# Patient Record
Sex: Female | Born: 1970
Health system: Southern US, Community
[De-identification: ages and names within clinical notes are randomized; demographics above are authoritative.]

## PROBLEM LIST (undated history)

## (undated) DIAGNOSIS — I10 Essential (primary) hypertension: Secondary | ICD-10-CM

## (undated) DIAGNOSIS — Z9889 Other specified postprocedural states: Secondary | ICD-10-CM

## (undated) DIAGNOSIS — R112 Nausea with vomiting, unspecified: Secondary | ICD-10-CM

## (undated) DIAGNOSIS — M1990 Unspecified osteoarthritis, unspecified site: Secondary | ICD-10-CM

## (undated) DIAGNOSIS — Z8249 Family history of ischemic heart disease and other diseases of the circulatory system: Secondary | ICD-10-CM

## (undated) HISTORY — PX: TUBAL LIGATION: SHX77

## (undated) HISTORY — PX: CHOLECYSTECTOMY: SHX55

## (undated) HISTORY — PX: INTRAUTERINE DEVICE INSERTION: SHX323

---

## 2000-10-17 ENCOUNTER — Emergency Department (HOSPITAL_COMMUNITY): Admission: EM | Admit: 2000-10-17 | Discharge: 2000-10-17 | Payer: Self-pay | Admitting: *Deleted

## 2000-11-21 ENCOUNTER — Encounter: Payer: Self-pay | Admitting: Obstetrics and Gynecology

## 2000-11-21 ENCOUNTER — Ambulatory Visit (HOSPITAL_COMMUNITY): Admission: RE | Admit: 2000-11-21 | Discharge: 2000-11-21 | Payer: Self-pay | Admitting: Obstetrics and Gynecology

## 2001-06-20 ENCOUNTER — Ambulatory Visit (HOSPITAL_COMMUNITY): Admission: RE | Admit: 2001-06-20 | Discharge: 2001-06-20 | Payer: Self-pay | Admitting: Obstetrics and Gynecology

## 2001-06-20 ENCOUNTER — Encounter: Payer: Self-pay | Admitting: Obstetrics and Gynecology

## 2001-07-02 ENCOUNTER — Other Ambulatory Visit: Admission: RE | Admit: 2001-07-02 | Discharge: 2001-07-02 | Payer: Self-pay | Admitting: Obstetrics & Gynecology

## 2002-05-01 ENCOUNTER — Other Ambulatory Visit: Admission: RE | Admit: 2002-05-01 | Discharge: 2002-05-01 | Payer: Self-pay | Admitting: Family Medicine

## 2005-06-01 ENCOUNTER — Other Ambulatory Visit: Admission: RE | Admit: 2005-06-01 | Discharge: 2005-06-01 | Payer: Self-pay | Admitting: Gynecology

## 2007-05-19 ENCOUNTER — Emergency Department (HOSPITAL_COMMUNITY): Admission: EM | Admit: 2007-05-19 | Discharge: 2007-05-19 | Payer: Self-pay | Admitting: Emergency Medicine

## 2007-05-20 ENCOUNTER — Ambulatory Visit (HOSPITAL_COMMUNITY): Admission: RE | Admit: 2007-05-20 | Discharge: 2007-05-20 | Payer: Self-pay | Admitting: Cardiovascular Disease

## 2007-05-21 ENCOUNTER — Ambulatory Visit (HOSPITAL_COMMUNITY): Admission: RE | Admit: 2007-05-21 | Discharge: 2007-05-21 | Payer: Self-pay | Admitting: Cardiovascular Disease

## 2008-01-21 ENCOUNTER — Other Ambulatory Visit: Admission: RE | Admit: 2008-01-21 | Discharge: 2008-01-21 | Payer: Self-pay | Admitting: Obstetrics and Gynecology

## 2008-01-21 ENCOUNTER — Ambulatory Visit: Payer: Self-pay | Admitting: Women's Health

## 2008-01-21 ENCOUNTER — Encounter: Payer: Self-pay | Admitting: Women's Health

## 2008-05-17 ENCOUNTER — Ambulatory Visit: Payer: Self-pay | Admitting: Obstetrics and Gynecology

## 2008-05-31 ENCOUNTER — Ambulatory Visit: Payer: Self-pay | Admitting: Obstetrics and Gynecology

## 2008-06-01 ENCOUNTER — Ambulatory Visit: Payer: Self-pay | Admitting: Obstetrics and Gynecology

## 2010-12-29 LAB — DIFFERENTIAL
Basophils Absolute: 0
Basophils Relative: 0
Eosinophils Absolute: 0.1
Eosinophils Relative: 1
Lymphocytes Relative: 26
Lymphs Abs: 2.1
Monocytes Absolute: 0.2
Monocytes Relative: 2 — ABNORMAL LOW
Neutro Abs: 5.7
Neutrophils Relative %: 70

## 2010-12-29 LAB — BASIC METABOLIC PANEL WITH GFR
CO2: 25
Chloride: 107
Creatinine, Ser: 0.64
GFR calc Af Amer: >60
Sodium: 139

## 2010-12-29 LAB — POCT CARDIAC MARKERS
CKMB, poc: <1 — ABNORMAL LOW
CKMB, poc: <1 — ABNORMAL LOW
Myoglobin, poc: 36.4
Myoglobin, poc: 37.5
Operator id: 4708
Operator id: 5362
Troponin i, poc: <0.05
Troponin i, poc: <0.05

## 2010-12-29 LAB — BASIC METABOLIC PANEL
BUN: 8
Calcium: 9.2
GFR calc non Af Amer: >60
Glucose, Bld: 103 — ABNORMAL HIGH
Potassium: 4.2

## 2010-12-29 LAB — CBC
HCT: 30 — ABNORMAL LOW
Hemoglobin: 9.7 — ABNORMAL LOW
MCHC: 32.4
MCV: 73.4 — ABNORMAL LOW
Platelets: 370
RBC: 4.08
RDW: 17.2 — ABNORMAL HIGH
WBC: 8.1

## 2010-12-29 LAB — D-DIMER, QUANTITATIVE: D-Dimer, Quant: <0.22

## 2012-11-07 ENCOUNTER — Ambulatory Visit: Payer: Self-pay | Admitting: Women's Health

## 2012-11-10 ENCOUNTER — Encounter: Payer: Self-pay | Admitting: Women's Health

## 2012-11-10 ENCOUNTER — Other Ambulatory Visit (HOSPITAL_COMMUNITY)
Admission: RE | Admit: 2012-11-10 | Discharge: 2012-11-10 | Disposition: A | Discharge status: Home / Self Care | Payer: BC Managed Care – PPO | Source: Ambulatory Visit | Attending: Gynecology | Admitting: Gynecology

## 2012-11-10 ENCOUNTER — Ambulatory Visit (INDEPENDENT_AMBULATORY_CARE_PROVIDER_SITE_OTHER): Payer: BC Managed Care – PPO | Admitting: Women's Health

## 2012-11-10 VITALS — BP 135/78 | Ht 63.0 in | Wt 350.0 lb

## 2012-11-10 DIAGNOSIS — Z13220 Encounter for screening for lipoid disorders: Secondary | ICD-10-CM

## 2012-11-10 DIAGNOSIS — Z01419 Encounter for gynecological examination (general) (routine) without abnormal findings: Secondary | ICD-10-CM

## 2012-11-10 DIAGNOSIS — Z833 Family history of diabetes mellitus: Secondary | ICD-10-CM

## 2012-11-10 DIAGNOSIS — E079 Disorder of thyroid, unspecified: Secondary | ICD-10-CM

## 2012-11-10 DIAGNOSIS — Z975 Presence of (intrauterine) contraceptive device: Secondary | ICD-10-CM

## 2012-11-10 HISTORY — DX: Morbid (severe) obesity due to excess calories: E66.01

## 2012-11-10 NOTE — Patient Instructions (Addendum)

## 2012-11-10 NOTE — Progress Notes (Signed)
Denae MAYDA SHIPPEE 28-Jun-1970 [id number]    History:    The patient presents for annual exam.  Mirena IUD placed 05/2008 for menorrhagia. Had some problems with menorrhagia after IUD, used Megace for several doses, now minimal bleeding. BTL. Normal Pap history. Has not had a mammogram. Morbid obesity.   Past medical history, past surgical history, family history and social history were all reviewed and documented in the EPIC chart. Parents hypertension, father heart disease, maternal grandmother breast cancer. 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 Finishing  MBA. Desk job.   ROS:  A  ROS was performed and pertinent positives and negatives are included in the history.  Exam:  Filed Vitals:   11/10/12 0920  BP: 135/78    General appearance:  Morbid obesity Head/Neck:  Normal, without cervical or supraclavicular adenopathy. Thyroid:  Symmetrical, normal in size, without palpable masses or nodularity. Respiratory  Effort:  Normal  Auscultation:  Clear without wheezing or rhonchi Cardiovascular  Auscultation:  Regular rate, without rubs, murmurs or gallops  Edema/varicosities:  Not grossly evident Abdominal  Soft,nontender, without masses, guarding or rebound.  Liver/spleen:  No organomegaly noted  Hernia:  None appreciated  Skin  Inspection:  Numerous skin tags  Palpation:  Grossly normal Neurologic/psychiatric  Orientation:  Normal with appropriate conversation.  Mood/affect:  Normal  Genitourinary    Breasts: Examined lying and sitting.     Right: Without masses, retractions, discharge or axillary adenopathy.     Left: Without masses, retractions, discharge or axillary adenopathy.   Inguinal/mons:  Normal without inguinal adenopathy  External genitalia:  Normal  BUS/Urethra/Skene's glands:  Normal  Bladder:  Normal  Vagina:  Normal  Cervix:  Normal IUD strings visible  Uterus:   normal in size, shape and  contour.  Midline and mobile  Adnexa/parametria:     Rt: Without masses or tenderness.   Lt: Without masses or tenderness.  Anus and perineum: Normal  Digital rectal exam: Normal sphincter tone without palpated masses or tenderness  Assessment/Plan:  42 y.o. MBF G2P2 for annual exam.     Mirena IUD placed 05/2008 Morbid obesity  Plan: Information concerning weight loss surgery given, states would like to pursue. SBE's, reviewed importance of annual screening mammogram, breast center number given instructed to schedule. Reviewed importance of increasing regular activity and decreasing calories for weight loss. Schedule ultrasound to 2 limited exam. CBC, glucose, lipid panel, UA, Pap. Last  Pap 2009, new screening guidelines reviewed. Reviewed importance of an annual checkup.    Harrington Challenger [facility], 12:10 PM 11/10/2012

## 2012-11-11 LAB — URINALYSIS W MICROSCOPIC + REFLEX CULTURE
Bilirubin Urine: NEGATIVE
Glucose, UA: NEGATIVE mg/dL
Protein, ur: NEGATIVE mg/dL
Urobilinogen, UA: 0.2 mg/dL (ref 0.0–1.0)

## 2012-11-11 LAB — CBC WITH DIFFERENTIAL/PLATELET
Basophils Relative: 0 % (ref 0–1)
Eosinophils Absolute: 0.2 10*3/uL (ref 0.0–0.7)
HCT: 35.8 % — ABNORMAL LOW (ref 36.0–46.0)
Hemoglobin: 11.3 g/dL — ABNORMAL LOW (ref 12.0–15.0)
Lymphs Abs: 1.6 10*3/uL (ref 0.7–4.0)
MCH: 24.2 pg — ABNORMAL LOW (ref 26.0–34.0)
MCHC: 31.6 g/dL (ref 30.0–36.0)
MCV: 76.8 fL — ABNORMAL LOW (ref 78.0–100.0)
Monocytes Absolute: 0.6 10*3/uL (ref 0.1–1.0)
Monocytes Relative: 8 % (ref 3–12)
Neutrophils Relative %: 66 % (ref 43–77)
RBC: 4.66 MIL/uL (ref 3.87–5.11)

## 2012-11-11 LAB — LIPID PANEL
HDL: 32 mg/dL — ABNORMAL LOW (ref >39)
Total CHOL/HDL Ratio: 4.7 Ratio
VLDL: 14 mg/dL (ref 0–40)

## 2012-11-11 LAB — GLUCOSE, RANDOM: Glucose, Bld: 125 mg/dL — ABNORMAL HIGH (ref 70–99)

## 2012-11-11 LAB — TSH: TSH: 2.479 u[IU]/mL (ref 0.350–4.500)

## 2012-11-12 LAB — URINE CULTURE: Colony Count: >=100000

## 2012-11-13 ENCOUNTER — Other Ambulatory Visit: Payer: Self-pay | Admitting: Gynecology

## 2012-11-13 DIAGNOSIS — R7309 Other abnormal glucose: Secondary | ICD-10-CM

## 2012-11-24 ENCOUNTER — Encounter: Payer: Self-pay | Admitting: Women's Health

## 2012-11-24 ENCOUNTER — Other Ambulatory Visit: Payer: BC Managed Care – PPO

## 2012-11-24 DIAGNOSIS — R7309 Other abnormal glucose: Secondary | ICD-10-CM

## 2012-11-27 ENCOUNTER — Telehealth: Payer: Self-pay | Admitting: *Deleted

## 2012-11-27 NOTE — Telephone Encounter (Signed)
Notes faxed to Dr.Balan office,they will contact patient to schedule appointment.

## 2012-11-28 NOTE — Telephone Encounter (Signed)
Appt. On 01/09/13 @ 10:30 am with the Dr.Balan, left on voicemail with time and date.

## 2013-02-12 ENCOUNTER — Other Ambulatory Visit: Payer: Self-pay

## 2013-04-09 HISTORY — PX: OTHER SURGICAL HISTORY: SHX169

## 2013-04-10 ENCOUNTER — Ambulatory Visit: Payer: Self-pay | Admitting: Specialist

## 2013-04-13 ENCOUNTER — Ambulatory Visit: Payer: Self-pay | Admitting: Specialist

## 2013-04-13 DIAGNOSIS — Z01810 Encounter for preprocedural cardiovascular examination: Secondary | ICD-10-CM

## 2013-04-13 LAB — CBC WITH DIFFERENTIAL/PLATELET
BASOS PCT: 0.8 %
Basophil #: 0.1 10*3/uL (ref 0.0–0.1)
EOS PCT: 1.4 %
Eosinophil #: 0.1 10*3/uL (ref 0.0–0.7)
HCT: 36.1 % (ref 35.0–47.0)
HGB: 11.5 g/dL — AB (ref 12.0–16.0)
LYMPHS ABS: 1.7 10*3/uL (ref 1.0–3.6)
Lymphocyte %: 21.6 %
MCH: 24.8 pg — AB (ref 26.0–34.0)
MCHC: 31.8 g/dL — AB (ref 32.0–36.0)
MCV: 78 fL — ABNORMAL LOW (ref 80–100)
Monocyte #: 0.7 x10 3/mm (ref 0.2–0.9)
Monocyte %: 8.6 %
NEUTROS ABS: 5.3 10*3/uL (ref 1.4–6.5)
NEUTROS PCT: 67.6 %
Platelet: 244 10*3/uL (ref 150–440)
RBC: 4.63 10*6/uL (ref 3.80–5.20)
RDW: 16.7 % — AB (ref 11.5–14.5)
WBC: 7.8 10*3/uL (ref 3.6–11.0)

## 2013-04-13 LAB — COMPREHENSIVE METABOLIC PANEL
ANION GAP: 6 — AB (ref 7–16)
Albumin: 3.3 g/dL — ABNORMAL LOW (ref 3.4–5.0)
Alkaline Phosphatase: 100 U/L
BUN: 7 mg/dL (ref 7–18)
Bilirubin,Total: 0.4 mg/dL (ref 0.2–1.0)
CALCIUM: 9 mg/dL (ref 8.5–10.1)
CHLORIDE: 104 mmol/L (ref 98–107)
CO2: 27 mmol/L (ref 21–32)
CREATININE: 0.69 mg/dL (ref 0.60–1.30)
EGFR (African American): >60
EGFR (Non-African Amer.): >60
GLUCOSE: 144 mg/dL — AB (ref 65–99)
Osmolality: 274 (ref 275–301)
POTASSIUM: 3.9 mmol/L (ref 3.5–5.1)
SGOT(AST): 47 U/L — ABNORMAL HIGH (ref 15–37)
SGPT (ALT): 54 U/L (ref 12–78)
Sodium: 137 mmol/L (ref 136–145)
Total Protein: 7.9 g/dL (ref 6.4–8.2)

## 2013-04-13 LAB — AMYLASE: AMYLASE: 33 U/L (ref 25–115)

## 2013-04-13 LAB — BILIRUBIN, DIRECT

## 2013-04-13 LAB — IRON AND TIBC
IRON SATURATION: 12 %
Iron Bind.Cap.(Total): 367 ug/dL (ref 250–450)
Iron: 44 ug/dL — ABNORMAL LOW (ref 50–170)
UNBOUND IRON-BIND. CAP.: 323 ug/dL

## 2013-04-13 LAB — TSH: Thyroid Stimulating Horm: 2.39 u[IU]/mL

## 2013-04-13 LAB — PROTIME-INR
INR: 1.1
PROTHROMBIN TIME: 13.7 s (ref 11.5–14.7)

## 2013-04-13 LAB — HEMOGLOBIN A1C: HEMOGLOBIN A1C: 7.6 % — AB (ref 4.2–6.3)

## 2013-04-13 LAB — PHOSPHORUS: Phosphorus: 3.2 mg/dL (ref 2.5–4.9)

## 2013-04-13 LAB — MAGNESIUM: MAGNESIUM: 1.8 mg/dL

## 2013-04-13 LAB — LIPASE, BLOOD: Lipase: 108 U/L (ref 73–393)

## 2013-04-13 LAB — APTT: ACTIVATED PTT: 26.5 s (ref 23.6–35.9)

## 2013-04-13 LAB — FERRITIN: Ferritin (ARMC): 29 ng/mL (ref 8–388)

## 2013-04-13 LAB — FOLATE: FOLIC ACID: 7.1 ng/mL (ref 3.1–100.0)

## 2013-05-08 ENCOUNTER — Ambulatory Visit (INDEPENDENT_AMBULATORY_CARE_PROVIDER_SITE_OTHER): Payer: BC Managed Care – PPO | Admitting: Women's Health

## 2013-05-08 ENCOUNTER — Encounter: Payer: Self-pay | Admitting: Women's Health

## 2013-05-08 DIAGNOSIS — N949 Unspecified condition associated with female genital organs and menstrual cycle: Secondary | ICD-10-CM

## 2013-05-08 DIAGNOSIS — N938 Other specified abnormal uterine and vaginal bleeding: Secondary | ICD-10-CM

## 2013-05-08 DIAGNOSIS — N898 Other specified noninflammatory disorders of vagina: Secondary | ICD-10-CM

## 2013-05-08 LAB — WET PREP FOR TRICH, YEAST, CLUE
Clue Cells Wet Prep HPF POC: NONE SEEN
Trich, Wet Prep: NONE SEEN
WBC, Wet Prep HPF POC: NONE SEEN
YEAST WET PREP: NONE SEEN

## 2013-05-08 MED ORDER — MEGESTROL ACETATE 40 MG PO TABS: 40.0000 mg | ORAL_TABLET | Freq: Two times a day (BID) | ORAL | Status: DC

## 2013-05-08 NOTE — Progress Notes (Signed)
Patient ID: Karen [name], female   DOB: [birth year], 43 y.o.   MRN: [id number] Presents with complaint of  long period. BTL with Mirena IUD placed 05/2008 for menorrhagia and DUB. Consistent monthly cycles lasting 3-5 days since IUD placed. 03/29/2013 1st day of last cycle and has bled since then with many days heavy with clots/cramps. Has change pad today twice. Denies urinary symptoms, discharge, abdominal pain or fever. Is scheduled for gastric sleeve  05/26/13. Numerous normal labs with surgeon.  Exam: Morbid obesity, appears well, external genitalia within normal limits, speculum exam scant menses type blood noted cervix without polyp or visible lesion, IUD string not visible. Bimanual no CMT. Limited exam/obesity. Wet prep negative.  Dysfunctional uterine bleeding/ Mirena IUD 05/2008/BTL Morbid obesity  Plan: Megace 40 twice daily for 10 days. Reviewed may not need to take, appears bleeding has slowed. Schedule ultrasound, assess ovaries and uterus and for IUD removal and placement with Dr. Phineas Real.

## 2013-05-08 NOTE — Addendum Note (Signed)
Addended by: Alen Blew on: 05/08/2013 04:54 PM   Modules accepted: Orders

## 2013-05-08 NOTE — Patient Instructions (Signed)
Dysfunctional Uterine Bleeding Normally, menstrual periods begin between ages 11 to 17 in [place] women. A normal menstrual cycle/period may begin every 23 days up to 35 days and lasts from 1 to 7 days. Around 12 to 14 days before your menstrual period starts, ovulation (ovary produces an egg) occurs. When counting the time between menstrual periods, count from the first day of bleeding of the previous period to the first day of bleeding of the next period. Dysfunctional (abnormal) uterine bleeding is bleeding that is different from a normal menstrual period. Your periods may come earlier or later than usual. They may be lighter, have blood clots or be heavier. You may have bleeding between periods, or you may skip one period or more. You may have bleeding after sexual intercourse, bleeding after menopause, or no menstrual period. CAUSES   Pregnancy (normal, miscarriage, tubal).  IUDs (intrauterine device, birth control).  Birth control pills.  Hormone treatment.  Menopause.  Infection of the cervix.  Blood clotting problems.  Infection of the inside lining of the uterus.  Endometriosis, inside lining of the uterus growing in the pelvis and other female organs.  Adhesions (scar tissue) inside the uterus.  Obesity or severe weight loss.  Uterine polyps inside the uterus.  Cancer of the vagina, cervix, or uterus.  Ovarian cysts or polycystic ovary syndrome.  Medical problems (diabetes, thyroid disease).  Uterine fibroids (noncancerous tumor).  Problems with your female hormones.  Endometrial hyperplasia, very thick lining and enlarged cells inside of the uterus.  Medicines that interfere with ovulation.  Radiation to the pelvis or abdomen.  Chemotherapy. DIAGNOSIS   Your doctor will discuss the history of your menstrual periods, medicines you are taking, changes in your weight, stress in your life, and any medical problems you may have.  Your doctor will do a physical  and pelvic examination.  Your doctor may want to perform certain tests to make a diagnosis, such as:  Pap test.  Blood tests.  Cultures for infection.  CT scan.  Ultrasound.  Hysteroscopy.  Laparoscopy.  MRI.  Hysterosalpingography.  D and C.  Endometrial biopsy. TREATMENT  Treatment will depend on the cause of the dysfunctional uterine bleeding (DUB). Treatment may include:  Observing your menstrual periods for a couple of months.  Prescribing medicines for medical problems, including:  Antibiotics.  Hormones.  Birth control pills.  Removing an IUD (intrauterine device, birth control).  Surgery:  D and C (scrape and remove tissue from inside the uterus).  Laparoscopy (examine inside the abdomen with a lighted tube).  Uterine ablation (destroy lining of the uterus with electrical current, laser, heat, or freezing).  Hysteroscopy (examine cervix and uterus with a lighted tube).  Hysterectomy (remove the uterus). HOME CARE INSTRUCTIONS   If medicines were prescribed, take exactly as directed. Do not change or switch medicines without consulting your caregiver.  Long term heavy bleeding may result in iron deficiency. Your caregiver may have prescribed iron pills. They help replace the iron that your body lost from heavy bleeding. Take exactly as directed.  Do not take aspirin or medicines that contain aspirin one week before or during your menstrual period. Aspirin may make the bleeding worse.  If you need to change your sanitary pad or tampon more than once every 2 hours, stay in bed with your feet elevated and a cold pack on your lower abdomen. Rest as much as possible, until the bleeding stops or slows down.  Eat well-balanced meals. Eat foods high in iron. Examples   are:  Leafy green vegetables.  Whole-grain breads and cereals.  Eggs.  Meat.  Liver.  Do not try to lose weight until the abnormal bleeding has stopped and your blood iron level is  back to normal. Do not lift more than ten pounds or do strenuous activities when you are bleeding.  For a couple of months, make note on your calendar, marking the start and ending of your period, and the type of bleeding (light, medium, heavy, spotting, clots or missed periods). This is for your caregiver to better evaluate your problem. SEEK MEDICAL CARE IF:   You develop nausea (feeling sick to your stomach) and vomiting, dizziness, or diarrhea while you are taking your medicine.  You are getting lightheaded or weak.  You have any problems that may be related to the medicine you are taking.  You develop pain with your DUB.  You want to remove your IUD.  You want to stop or change your birth control pills or hormones.  You have any type of abnormal bleeding mentioned above.  You are over 16 years old and have not had a menstrual period yet.  You are 43 years old and you are still having menstrual periods.  You have any of the symptoms mentioned above.  You develop a rash. SEEK IMMEDIATE MEDICAL CARE IF:   An oral temperature above 102 F (38.9 C) develops.  You develop chills.  You are changing your sanitary pad or tampon more than once an hour.  You develop abdominal pain.  You pass out or faint. Document Released: 03/23/2000 Document Revised: 06/18/2011 Document Reviewed: 02/22/2009 [employer] Patient Information 2014 [employer].  

## 2013-05-10 ENCOUNTER — Ambulatory Visit: Payer: Self-pay | Admitting: Specialist

## 2013-05-12 ENCOUNTER — Ambulatory Visit: Payer: BC Managed Care – PPO | Admitting: Women's Health

## 2013-05-28 ENCOUNTER — Inpatient Hospital Stay: Payer: Self-pay | Admitting: Specialist

## 2013-05-29 ENCOUNTER — Telehealth: Payer: Self-pay | Admitting: *Deleted

## 2013-05-29 ENCOUNTER — Other Ambulatory Visit: Payer: Self-pay | Admitting: *Deleted

## 2013-05-29 LAB — CBC WITH DIFFERENTIAL/PLATELET
BASOS ABS: 0 10*3/uL (ref 0.0–0.1)
Basophil %: 0.3 %
EOS ABS: 0 10*3/uL (ref 0.0–0.7)
Eosinophil %: 0 %
HCT: 32.6 % — ABNORMAL LOW (ref 35.0–47.0)
HGB: 10.7 g/dL — ABNORMAL LOW (ref 12.0–16.0)
LYMPHS ABS: 1.2 10*3/uL (ref 1.0–3.6)
Lymphocyte %: 11.3 %
MCH: 26.4 pg (ref 26.0–34.0)
MCHC: 32.7 g/dL (ref 32.0–36.0)
MCV: 81 fL (ref 80–100)
Monocyte #: 0.9 x10 3/mm (ref 0.2–0.9)
Monocyte %: 8.1 %
NEUTROS PCT: 80.3 %
Neutrophil #: 8.6 10*3/uL — ABNORMAL HIGH (ref 1.4–6.5)
Platelet: 262 10*3/uL (ref 150–440)
RBC: 4.03 10*6/uL (ref 3.80–5.20)
RDW: 16.6 % — AB (ref 11.5–14.5)
WBC: 10.7 10*3/uL (ref 3.6–11.0)

## 2013-05-29 LAB — BASIC METABOLIC PANEL
Anion Gap: 6 — ABNORMAL LOW (ref 7–16)
BUN: 5 mg/dL — AB (ref 7–18)
CHLORIDE: 106 mmol/L (ref 98–107)
CREATININE: 0.81 mg/dL (ref 0.60–1.30)
Calcium, Total: 8.5 mg/dL (ref 8.5–10.1)
Co2: 25 mmol/L (ref 21–32)
EGFR (Non-African Amer.): >60
Glucose: 95 mg/dL (ref 65–99)
OSMOLALITY: 271 (ref 275–301)
POTASSIUM: 3.8 mmol/L (ref 3.5–5.1)
Sodium: 137 mmol/L (ref 136–145)

## 2013-05-29 LAB — PHOSPHORUS: Phosphorus: 3.8 mg/dL (ref 2.5–4.9)

## 2013-05-29 LAB — MAGNESIUM: Magnesium: 1.7 mg/dL — ABNORMAL LOW

## 2013-05-29 LAB — ALBUMIN: Albumin: 3.3 g/dL — ABNORMAL LOW (ref 3.4–5.0)

## 2013-05-29 MED ORDER — MEGESTROL ACETATE 40 MG/ML PO SUSP: 40.0000 mg | Freq: Every day | ORAL | Status: DC

## 2013-05-29 NOTE — Telephone Encounter (Signed)
(  You are back up MD) pt called requesting refill on megace, nancy prescribed this on OV 05/08/13 due to DUB. Pt said she would like liquid megace if possible, pt had gastric sleeve 05/26/13 and unable to take pills. Please advise

## 2013-05-29 NOTE — Telephone Encounter (Signed)
Patient is supposed to have an ultrasound appointment and replacement of her Mirena IUD. Make sure that she is on track for this. Recommend Megace 20 mg twice a day #10

## 2013-05-29 NOTE — Telephone Encounter (Signed)
Pt informed rx has been sent. And will call back to have the ultrasound scheduled and IUD removal.

## 2013-05-29 NOTE — Telephone Encounter (Signed)
I will speak with patient about ultrasound and replacement of Mirena IUD. But she will need liquid megace, the above is what I found in epic for liquid megace. Im not sure which one to prescribe. Please advise

## 2013-05-29 NOTE — Telephone Encounter (Signed)
(  You are back up MD) pt called requesting refill on megace, nancy prescribed this on OV 05/08/13

## 2013-06-01 LAB — PATHOLOGY REPORT

## 2013-06-16 ENCOUNTER — Ambulatory Visit: Payer: Self-pay | Admitting: Specialist

## 2013-07-05 ENCOUNTER — Emergency Department (HOSPITAL_COMMUNITY)
Admission: EM | Admit: 2013-07-05 | Discharge: 2013-07-05 | Disposition: A | Discharge status: Home / Self Care | Payer: BC Managed Care – PPO | Attending: Emergency Medicine | Admitting: Emergency Medicine

## 2013-07-05 ENCOUNTER — Emergency Department (HOSPITAL_COMMUNITY): Payer: BC Managed Care – PPO

## 2013-07-05 ENCOUNTER — Encounter (HOSPITAL_COMMUNITY): Payer: Self-pay | Admitting: Emergency Medicine

## 2013-07-05 DIAGNOSIS — Z9889 Other specified postprocedural states: Secondary | ICD-10-CM | POA: Insufficient documentation

## 2013-07-05 DIAGNOSIS — Z3202 Encounter for pregnancy test, result negative: Secondary | ICD-10-CM | POA: Insufficient documentation

## 2013-07-05 DIAGNOSIS — Z87891 Personal history of nicotine dependence: Secondary | ICD-10-CM | POA: Insufficient documentation

## 2013-07-05 DIAGNOSIS — Z79899 Other long term (current) drug therapy: Secondary | ICD-10-CM | POA: Insufficient documentation

## 2013-07-05 DIAGNOSIS — E669 Obesity, unspecified: Secondary | ICD-10-CM | POA: Insufficient documentation

## 2013-07-05 DIAGNOSIS — R1012 Left upper quadrant pain: Secondary | ICD-10-CM | POA: Insufficient documentation

## 2013-07-05 DIAGNOSIS — R109 Unspecified abdominal pain: Secondary | ICD-10-CM

## 2013-07-05 DIAGNOSIS — R1032 Left lower quadrant pain: Secondary | ICD-10-CM | POA: Insufficient documentation

## 2013-07-05 DIAGNOSIS — R110 Nausea: Secondary | ICD-10-CM | POA: Insufficient documentation

## 2013-07-05 LAB — CBC WITH DIFFERENTIAL/PLATELET
BASOS ABS: 0 10*3/uL (ref 0.0–0.1)
BASOS PCT: 0 % (ref 0–1)
EOS PCT: 2 % (ref 0–5)
Eosinophils Absolute: 0.2 10*3/uL (ref 0.0–0.7)
HEMATOCRIT: 38.2 % (ref 36.0–46.0)
HEMOGLOBIN: 12.2 g/dL (ref 12.0–15.0)
Lymphocytes Relative: 33 % (ref 12–46)
Lymphs Abs: 3.5 10*3/uL (ref 0.7–4.0)
MCH: 25.5 pg — AB (ref 26.0–34.0)
MCHC: 31.9 g/dL (ref 30.0–36.0)
MCV: 79.7 fL (ref 78.0–100.0)
MONO ABS: 1 10*3/uL (ref 0.1–1.0)
MONOS PCT: 9 % (ref 3–12)
Neutro Abs: 6.1 10*3/uL (ref 1.7–7.7)
Neutrophils Relative %: 56 % (ref 43–77)
Platelets: 266 10*3/uL (ref 150–400)
RBC: 4.79 MIL/uL (ref 3.87–5.11)
RDW: 16.4 % — AB (ref 11.5–15.5)
WBC: 10.8 10*3/uL — ABNORMAL HIGH (ref 4.0–10.5)

## 2013-07-05 LAB — URINALYSIS, ROUTINE W REFLEX MICROSCOPIC
Bilirubin Urine: NEGATIVE
GLUCOSE, UA: NEGATIVE mg/dL
Hgb urine dipstick: NEGATIVE
KETONES UR: NEGATIVE mg/dL
LEUKOCYTES UA: NEGATIVE
NITRITE: NEGATIVE
PH: 6 (ref 5.0–8.0)
PROTEIN: NEGATIVE mg/dL
Specific Gravity, Urine: >1.046 — ABNORMAL HIGH (ref 1.005–1.030)
Urobilinogen, UA: 0.2 mg/dL (ref 0.0–1.0)

## 2013-07-05 LAB — COMPREHENSIVE METABOLIC PANEL
ALBUMIN: 3.6 g/dL (ref 3.5–5.2)
ALT: 44 U/L — ABNORMAL HIGH (ref 0–35)
AST: 37 U/L (ref 0–37)
Alkaline Phosphatase: 76 U/L (ref 39–117)
BILIRUBIN TOTAL: 0.3 mg/dL (ref 0.3–1.2)
BUN: 8 mg/dL (ref 6–23)
CALCIUM: 9.4 mg/dL (ref 8.4–10.5)
CO2: 21 mEq/L (ref 19–32)
CREATININE: 0.63 mg/dL (ref 0.50–1.10)
Chloride: 103 mEq/L (ref 96–112)
GFR calc Af Amer: >90 mL/min (ref >90)
Glucose, Bld: 99 mg/dL (ref 70–99)
Potassium: 4.1 mEq/L (ref 3.7–5.3)
Sodium: 138 mEq/L (ref 137–147)
Total Protein: 7.6 g/dL (ref 6.0–8.3)

## 2013-07-05 LAB — POC URINE PREG, ED: PREG TEST UR: NEGATIVE

## 2013-07-05 LAB — LIPASE, BLOOD: LIPASE: 71 U/L — AB (ref 11–59)

## 2013-07-05 MED ORDER — SODIUM CHLORIDE 0.9 % IV BOLUS (SEPSIS)
1000.0000 mL | Freq: Once | INTRAVENOUS | Status: AC
  Administered 2013-07-05: 1000 mL via INTRAVENOUS

## 2013-07-05 MED ORDER — MORPHINE SULFATE 4 MG/ML IJ SOLN
4.0000 mg | Freq: Once | INTRAMUSCULAR | Status: AC
  Administered 2013-07-05: 4 mg via INTRAVENOUS
  Filled 2013-07-05: qty 1

## 2013-07-05 MED ORDER — IOHEXOL 300 MG/ML  SOLN
50.0000 mL | Freq: Once | INTRAMUSCULAR | Status: AC | PRN
  Administered 2013-07-05: 50 mL via ORAL

## 2013-07-05 MED ORDER — IOHEXOL 300 MG/ML  SOLN: 100.0000 mL | Freq: Once | INTRAMUSCULAR | Status: DC | PRN

## 2013-07-05 MED ORDER — ONDANSETRON HCL 4 MG/2ML IJ SOLN
4.0000 mg | Freq: Once | INTRAMUSCULAR | Status: AC
  Administered 2013-07-05: 4 mg via INTRAVENOUS
  Filled 2013-07-05: qty 2

## 2013-07-05 NOTE — ED Notes (Signed)
Pt states had gastric sleeve put in May 28, 2013. Pt was on liquid only diet for 3 wks, has been eating solid foods for 2 wks. Pt states pain started around the same time she started eating solid foods.

## 2013-07-05 NOTE — ED Notes (Signed)
Pt arrived to the ED with a complaint of abdominal pain.  Pt states pain has been present for 2 weeks.  Pt has had a recent gastric sleeve put in and is only able to have liquids.  Pt states pain is located on the left lower side of the abdomen and radiates towards the center.

## 2013-07-05 NOTE — Discharge Instructions (Signed)
Your lab testing and CAT scan today has not shown any signs for an emergent cause of your symptoms. Please followup with the primary care provider for continued evaluation and treatment.    Abdominal Pain, Women Abdominal (stomach, pelvic, or belly) pain can be caused by many things. It is important to tell your doctor:  The location of the pain.  Does it come and go or is it present all the time?  Are there things that start the pain (eating certain foods, exercise)?  Are there other symptoms associated with the pain (fever, nausea, vomiting, diarrhea)? All of this is helpful to know when trying to find the cause of the pain. CAUSES   Stomach: virus or bacteria infection, or ulcer.  Intestine: appendicitis (inflamed appendix), regional ileitis (Crohn's disease), ulcerative colitis (inflamed colon), irritable bowel syndrome, diverticulitis (inflamed diverticulum of the colon), or cancer of the stomach or intestine.  Gallbladder disease or stones in the gallbladder.  Kidney disease, kidney stones, or infection.  Pancreas infection or cancer.  Fibromyalgia (pain disorder).  Diseases of the female organs:  Uterus: fibroid (non-cancerous) tumors or infection.  Fallopian tubes: infection or tubal pregnancy.  Ovary: cysts or tumors.  Pelvic adhesions (scar tissue).  Endometriosis (uterus lining tissue growing in the pelvis and on the pelvic organs).  Pelvic congestion syndrome (female organs filling up with blood just before the menstrual period).  Pain with the menstrual period.  Pain with ovulation (producing an egg).  Pain with an IUD (intrauterine device, birth control) in the uterus.  Cancer of the female organs.  Functional pain (pain not caused by a disease, may improve without treatment).  Psychological pain.  Depression. DIAGNOSIS  Your doctor will decide the seriousness of your pain by doing an examination.  Blood tests.  X-rays.  Ultrasound.  CT  scan (computed tomography, special type of X-ray).  MRI (magnetic resonance imaging).  Cultures, for infection.  Barium enema (dye inserted in the large intestine, to better view it with X-rays).  Colonoscopy (looking in intestine with a lighted tube).  Laparoscopy (minor surgery, looking in abdomen with a lighted tube).  Major abdominal exploratory surgery (looking in abdomen with a large incision). TREATMENT  The treatment will depend on the cause of the pain.   Many cases can be observed and treated at home.  Over-the-counter medicines recommended by your caregiver.  Prescription medicine.  Antibiotics, for infection.  Birth control pills, for painful periods or for ovulation pain.  Hormone treatment, for endometriosis.  Nerve blocking injections.  Physical therapy.  Antidepressants.  Counseling with a psychologist or psychiatrist.  Minor or major surgery. HOME CARE INSTRUCTIONS   Do not take laxatives, unless directed by your caregiver.  Take over-the-counter pain medicine only if ordered by your caregiver. Do not take aspirin because it can cause an upset stomach or bleeding.  Try a clear liquid diet (broth or water) as ordered by your caregiver. Slowly move to a bland diet, as tolerated, if the pain is related to the stomach or intestine.  Have a thermometer and take your temperature several times a day, and record it.  Bed rest and sleep, if it helps the pain.  Avoid sexual intercourse, if it causes pain.  Avoid stressful situations.  Keep your follow-up appointments and tests, as your caregiver orders.  If the pain does not go away with medicine or surgery, you may try:  Acupuncture.  Relaxation exercises (yoga, meditation).  Group therapy.  Counseling. SEEK MEDICAL CARE IF:  You notice certain foods cause stomach pain.  Your home care treatment is not helping your pain.  You need stronger pain medicine.  You want your IUD  removed.  You feel faint or lightheaded.  You develop nausea and vomiting.  You develop a rash.  You are having side effects or an allergy to your medicine. SEEK IMMEDIATE MEDICAL CARE IF:   Your pain does not go away or gets worse.  You have a fever.  Your pain is felt only in portions of the abdomen. The right side could possibly be appendicitis. The left lower portion of the abdomen could be colitis or diverticulitis.  You are passing blood in your stools (bright red or black tarry stools, with or without vomiting).  You have blood in your urine.  You develop chills, with or without a fever.  You pass out. MAKE SURE YOU:   Understand these instructions.  Will watch your condition.  Will get help right away if you are not doing well or get worse. Document Released: 01/21/2007 Document Revised: 06/18/2011 Document Reviewed: 02/10/2009 [facility] Patient Information 2014 [place], [place].

## 2013-07-05 NOTE — ED Notes (Signed)
Pt states unable to urinate at this time. 

## 2013-07-05 NOTE — ED Provider Notes (Signed)
CSN: [id number]     Arrival date & time 07/05/13  0028 History   First MD Initiated Contact with Patient 07/05/13 0106     Chief Complaint  Patient presents with  . Abdominal Pain   HPI  History provided by the patient and family. Patient is a 43 year old female with history of obesity, gastric sleeve procedure on February 19 who presents with complaints of persistent left-sided abdominal pain with nausea. Symptoms have been waxing and waning for the past several weeks. Pain is primarily in the left side radiates slightly to the flank and lower abdomen. She denies any specific aggravating or alleviating factor. This evening it became suddenly worse and seem worse when ever she tries to eat foods. She has been able to have liquids to drink. She was given prescriptions for hydrocodone following her surgery which she has used occasionally without any improvement of her pain. She has also used some medicines for nausea which does seem to help some. Symptoms have not been associated with any fever, chills or sweats. No diarrhea or constipation. She denies any urinary complaints. No other associated symptoms.   General surgeon is: Dr. Darnell Level at [facility] Was seen for a 2-week postop checkup recently and did discuss these complaints with surgeon who felt they were related to muscle soreness post surgery.    History reviewed. No pertinent past medical history. Past Surgical History  Procedure Laterality Date  . Gastic sleeve  2015   Family History  Problem Relation Age of Onset  . Hyperlipidemia Father    History  Substance Use Topics  . Smoking status: Former Research scientist (life sciences)  . Smokeless tobacco: Never Used  . Alcohol Use: No   OB History   Grav Para Term Preterm Abortions TAB SAB Ect Mult Living                 Review of Systems  Constitutional: Positive for appetite change. Negative for fever, chills and diaphoresis.  Respiratory: Negative for shortness of breath.   Cardiovascular:  Negative for chest pain.  Gastrointestinal: Positive for nausea and abdominal pain. Negative for vomiting, diarrhea and constipation.  All other systems reviewed and are negative.      Allergies  Other  Home Medications   Current Outpatient Rx  Name  Route  Sig  Dispense  Refill  . Ascorbic Acid 500 MG CHEW   Oral   Chew 1 tablet by mouth daily.         . calcium carbonate (OS-CAL) 1250 MG chewable tablet   Oral   Chew 4 tablets by mouth daily.         . calcium carbonate (TITRALAC) 420 MG CHEW chewable tablet   Oral   Chew 840 mg by mouth daily.         . megestrol (MEGACE) 40 MG/ML suspension   Oral   Take 1 mL (40 mg total) by mouth daily.   240 mL   0   . Pediatric Multiple Vitamins (CHEWABLE MULTIPLE VITAMINS PO)   Oral   Take 1 tablet by mouth daily.          BP 143/96  Pulse 92  Temp(Src) 98 F (36.7 C) (Oral)  Resp 18  SpO2 100%  LMP 05/06/2013 Physical Exam  Nursing note and vitals reviewed. Constitutional: She is oriented to person, place, and time. She appears well-developed and well-nourished. No distress.  HENT:  Head: Normocephalic.  Cardiovascular: Normal rate and regular rhythm.   Pulmonary/Chest: Effort normal and  breath sounds normal. No respiratory distress. She has no wheezes. She has no rales.  Abdominal: Soft. There is tenderness in the left upper quadrant and left lower quadrant. There is no rebound, no guarding, no CVA tenderness, no tenderness at McBurney's point and negative Murphy's sign.  Obese. Exam is somewhat limited by body habitus.  Musculoskeletal: Normal range of motion.  Neurological: She is alert and oriented to person, place, and time.  Skin: Skin is warm and dry. No rash noted.  Psychiatric: She has a normal mood and affect. Her behavior is normal.    ED Course  Procedures   DIAGNOSTIC STUDIES: Oxygen Saturation is 100% on room air.    COORDINATION OF CARE:  Nursing notes reviewed. Vital signs reviewed.  Initial pt interview and examination performed.   1:38 AM-patient seen and evaluated. She appears in mild pain and discomforts. Afebrile. Discussed work up plan with pt at bedside, which includes lab testing. Pt agrees with plan.  Patient is feeling better after pain medications. Given her recent surgery an elevated WBC as well as lipase will obtain CT scan contrast.  CT scan unremarkable. The patient continues to be feeling well without significant discomforts. UA pending.  UA unremarkable. It is concentrated with possible slight dehydration. Patient was given IV fluids. All signs of was unremarkable. At this time she may be discharged home with symptomatic treatment. She reports still having enough pain medications at home as well as medications for nausea. She agrees with plan   Results for orders placed during the hospital encounter of 07/05/13  CBC WITH DIFFERENTIAL      Result Value Ref Range   WBC 10.8 (*) 4.0 - 10.5 K/uL   RBC 4.79  3.87 - 5.11 MIL/uL   Hemoglobin 12.2  12.0 - 15.0 g/dL   HCT 38.2  36.0 - 46.0 %   MCV 79.7  78.0 - 100.0 fL   MCH 25.5 (*) 26.0 - 34.0 pg   MCHC 31.9  30.0 - 36.0 g/dL   RDW 16.4 (*) 11.5 - 15.5 %   Platelets 266  150 - 400 K/uL   Neutrophils Relative % 56  43 - 77 %   Neutro Abs 6.1  1.7 - 7.7 K/uL   Lymphocytes Relative 33  12 - 46 %   Lymphs Abs 3.5  0.7 - 4.0 K/uL   Monocytes Relative 9  3 - 12 %   Monocytes Absolute 1.0  0.1 - 1.0 K/uL   Eosinophils Relative 2  0 - 5 %   Eosinophils Absolute 0.2  0.0 - 0.7 K/uL   Basophils Relative 0  0 - 1 %   Basophils Absolute 0.0  0.0 - 0.1 K/uL  COMPREHENSIVE METABOLIC PANEL      Result Value Ref Range   Sodium 138  137 - 147 mEq/L   Potassium 4.1  3.7 - 5.3 mEq/L   Chloride 103  96 - 112 mEq/L   CO2 21  19 - 32 mEq/L   Glucose, Bld 99  70 - 99 mg/dL   BUN 8  6 - 23 mg/dL   Creatinine, Ser 0.63  0.50 - 1.10 mg/dL   Calcium 9.4  8.4 - 10.5 mg/dL   Total Protein 7.6  6.0 - 8.3 g/dL   Albumin  3.6  3.5 - 5.2 g/dL   AST 37  0 - 37 U/L   ALT 44 (*) 0 - 35 U/L   Alkaline Phosphatase 76  39 - 117 U/L  Total Bilirubin 0.3  0.3 - 1.2 mg/dL   GFR calc non Af Amer >90  >90 mL/min   GFR calc Af Amer >90  >90 mL/min  LIPASE, BLOOD      Result Value Ref Range   Lipase 71 (*) 11 - 59 U/L  URINALYSIS, ROUTINE W REFLEX MICROSCOPIC      Result Value Ref Range   Color, Urine YELLOW  YELLOW   APPearance CLEAR  CLEAR   Specific Gravity, Urine >1.046 (*) 1.005 - 1.030   pH 6.0  5.0 - 8.0   Glucose, UA NEGATIVE  NEGATIVE mg/dL   Hgb urine dipstick NEGATIVE  NEGATIVE   Bilirubin Urine NEGATIVE  NEGATIVE   Ketones, ur NEGATIVE  NEGATIVE mg/dL   Protein, ur NEGATIVE  NEGATIVE mg/dL   Urobilinogen, UA 0.2  0.0 - 1.0 mg/dL   Nitrite NEGATIVE  NEGATIVE   Leukocytes, UA NEGATIVE  NEGATIVE  POC URINE PREG, ED      Result Value Ref Range   Preg Test, Ur NEGATIVE  NEGATIVE      Imaging Review Ct Abdomen Pelvis W Contrast  07/05/2013   CLINICAL DATA:  Abdominal pain, history of gastric sleeve May 28, 2013. Elevated lipase.  EXAM: CT ABDOMEN AND PELVIS WITH CONTRAST  TECHNIQUE: Multidetector CT imaging of the abdomen and pelvis was performed using the standard protocol following bolus administration of intravenous contrast.  CONTRAST:  95mL OMNIPAQUE IOHEXOL 300 MG/ML  SOLN  COMPARISON:  US ABDOMEN LIMITED RUQ/ASCITES dated 04/13/2013  FINDINGS: Large body habitus results and noisy image quality. Included view of the lung bases are clear. Visualized heart and pericardium are unremarkable.  The liver is mildly enlarged, 19 cm in craniocaudad dimension otherwise unremarkable. The gallbladder, pancreas and adrenal glands are unremarkable. Patient's known gallstone is occult on CT.  Status post partial gastrectomy/ gastric sleeve. The small and large bowel are normal in course and caliber without inflammatory changes. Contrast has yet to reach the distal small bowel. The appendix is not discretely  identified, however there are no inflammatory changes in the right lower quadrant. . No intraperitoneal free fluid nor free air.  Kidneys are orthotopic, demonstrating symmetric enhancement without nephrolithiasis, hydronephrosis or renal masses. The unopacified ureters are normal in course and caliber. Delayed imaging through the kidneys demonstrates symmetric prompt excretion to the proximal urinary collecting system. Urinary bladder is partially distended and unremarkable.  Aortoiliac vessels are normal in course and caliber with minimal calcific atherosclerosis. Small periportal lymph nodes are likely reactive. Internal reproductive organs are nonsuspicious, tubal ligation clips. The soft tissues and included osseous structures are nonsuspicious.  IMPRESSION: Status post sleeve gastrectomy with expected postoperative changes, no bowel obstruction or acute intra-abdominal or pelvic process.  Mild hepatomegaly.   Electronically Signed   By: Elon Alas   On: 07/05/2013 04:52     EKG Interpretation None      MDM   Final diagnoses:  Abdominal pain  Nausea        Martie Lee, PA-C 07/05/13 [id number]

## 2013-07-05 NOTE — ED Notes (Signed)
Bed: VB16 Expected date:  Expected time:  Means of arrival:  Comments: Rm 2

## 2013-07-06 NOTE — ED Provider Notes (Signed)
Medical screening examination/treatment/procedure(s) were performed by non-physician practitioner and as supervising physician I was immediately available for consultation/collaboration.   EKG Interpretation None       Varney Biles, MD 07/06/13 0151

## 2013-07-08 ENCOUNTER — Ambulatory Visit: Payer: Self-pay | Admitting: Specialist

## 2013-07-16 ENCOUNTER — Other Ambulatory Visit (HOSPITAL_COMMUNITY): Payer: Self-pay | Admitting: Specialist

## 2013-07-16 DIAGNOSIS — R109 Unspecified abdominal pain: Secondary | ICD-10-CM

## 2013-07-20 ENCOUNTER — Ambulatory Visit (HOSPITAL_COMMUNITY): Admission: RE | Admit: 2013-07-20 | Payer: BC Managed Care – PPO | Source: Ambulatory Visit

## 2013-07-20 ENCOUNTER — Ambulatory Visit (HOSPITAL_COMMUNITY)
Admission: RE | Admit: 2013-07-20 | Discharge: 2013-07-20 | Disposition: A | Discharge status: Home / Self Care | Payer: BC Managed Care – PPO | Source: Ambulatory Visit | Attending: Specialist | Admitting: Specialist

## 2013-07-20 DIAGNOSIS — K8020 Calculus of gallbladder without cholecystitis without obstruction: Secondary | ICD-10-CM | POA: Insufficient documentation

## 2013-07-20 DIAGNOSIS — R109 Unspecified abdominal pain: Secondary | ICD-10-CM | POA: Insufficient documentation

## 2013-08-04 ENCOUNTER — Other Ambulatory Visit: Payer: Self-pay | Admitting: Women's Health

## 2013-08-04 MED ORDER — MEGESTROL ACETATE 40 MG PO TABS: 40.0000 mg | ORAL_TABLET | Freq: Two times a day (BID) | ORAL | Status: DC

## 2013-08-07 ENCOUNTER — Other Ambulatory Visit: Payer: Self-pay | Admitting: Women's Health

## 2013-08-21 ENCOUNTER — Ambulatory Visit: Payer: BC Managed Care – PPO | Admitting: Gynecology

## 2013-08-21 ENCOUNTER — Other Ambulatory Visit: Payer: BC Managed Care – PPO

## 2013-08-25 ENCOUNTER — Encounter (HOSPITAL_COMMUNITY): Payer: Self-pay | Admitting: Emergency Medicine

## 2013-08-25 ENCOUNTER — Emergency Department (HOSPITAL_COMMUNITY)
Admission: EM | Admit: 2013-08-25 | Discharge: 2013-08-26 | Disposition: A | Discharge status: Home / Self Care | Payer: BC Managed Care – PPO | Attending: Emergency Medicine | Admitting: Emergency Medicine

## 2013-08-25 ENCOUNTER — Emergency Department (HOSPITAL_COMMUNITY): Payer: BC Managed Care – PPO

## 2013-08-25 DIAGNOSIS — R1013 Epigastric pain: Secondary | ICD-10-CM | POA: Insufficient documentation

## 2013-08-25 DIAGNOSIS — Z79899 Other long term (current) drug therapy: Secondary | ICD-10-CM | POA: Insufficient documentation

## 2013-08-25 DIAGNOSIS — Z9884 Bariatric surgery status: Secondary | ICD-10-CM | POA: Insufficient documentation

## 2013-08-25 DIAGNOSIS — R197 Diarrhea, unspecified: Secondary | ICD-10-CM | POA: Insufficient documentation

## 2013-08-25 DIAGNOSIS — R112 Nausea with vomiting, unspecified: Secondary | ICD-10-CM | POA: Insufficient documentation

## 2013-08-25 DIAGNOSIS — Z87891 Personal history of nicotine dependence: Secondary | ICD-10-CM | POA: Insufficient documentation

## 2013-08-25 DIAGNOSIS — Z9089 Acquired absence of other organs: Secondary | ICD-10-CM | POA: Insufficient documentation

## 2013-08-25 MED ORDER — ONDANSETRON HCL 4 MG/2ML IJ SOLN
4.0000 mg | Freq: Once | INTRAMUSCULAR | Status: DC
Start: 2013-08-25 — End: 2013-08-26
  Filled 2013-08-25: qty 2

## 2013-08-25 MED ORDER — GI COCKTAIL ~~LOC~~
30.0000 mL | Freq: Once | ORAL | Status: AC
  Administered 2013-08-25: 30 mL via ORAL
  Filled 2013-08-25: qty 30

## 2013-08-25 NOTE — ED Notes (Signed)
Patient taken to CXR Will start PIV and medicate patient upon return

## 2013-08-25 NOTE — ED Provider Notes (Signed)
CSN: [id number]     Arrival date & time 08/25/13  2235 History   First MD Initiated Contact with Patient 08/25/13 2303     Chief Complaint  Patient presents with  . Chest Pain     (Consider location/radiation/quality/duration/timing/severity/associated sxs/prior Treatment) HPI 43 year old female presents to emergency room from home with complaint of upper abdomen/lower chest pain starting yesterday.  Patient reports she has had a day and a half of nausea vomiting and diarrhea.  She reports after an episode of vomiting, she had worsening pain.  She denies any sick contacts, no unusual foods.  She denies any shortness of breath, no radiation of the pain.  Pain is a sharp pinching sensation.  No prior history of same.  Patient is status post gastric sleeve done at [facility] with Dr. Darnell Level in February.  She is status post cholecystectomy done at [facility] on April 27. History reviewed. No pertinent past medical history. Past Surgical History  Procedure Laterality Date  . Gastic sleeve  2015  . Cholecystectomy     Family History  Problem Relation Age of Onset  . Hyperlipidemia Father   . Heart attack Father   . Heart attack Other   . Hypertension Other   . Cancer Other   . Stroke Other    History  Substance Use Topics  . Smoking status: Former Research scientist (life sciences)  . Smokeless tobacco: Never Used  . Alcohol Use: No   OB History   Grav Para Term Preterm Abortions TAB SAB Ect Mult Living                 Review of Systems  See History of Present Illness; otherwise all other systems are reviewed and negative   Allergies  Other  Home Medications   Prior to Admission medications   Medication Sig Start Date End Date Taking? Authorizing Provider  Ascorbic Acid 500 MG CHEW Chew 1 tablet by mouth daily.    Historical Provider, MD  calcium carbonate (OS-CAL) 1250 MG chewable tablet Chew 4 tablets by mouth daily.    Historical Provider, MD  calcium carbonate (TITRALAC) 420 MG CHEW  chewable tablet Chew 840 mg by mouth daily.    Historical Provider, MD  megestrol (MEGACE) 40 MG tablet Take 1 tablet (40 mg total) by mouth 2 (two) times daily. 08/04/13   Huel Cote, NP  Pediatric Multiple Vitamins (CHEWABLE MULTIPLE VITAMINS PO) Take 1 tablet by mouth daily.    Historical Provider, MD   BP 145/91  Pulse 86  Temp(Src) 98.5 F (36.9 C) (Oral)  Resp 12  SpO2 98%  LMP 07/31/2013 Physical Exam  Nursing note and vitals reviewed. Constitutional: She is oriented to person, place, and time. She appears well-developed and well-nourished.  HENT:  Head: Normocephalic and atraumatic.  Nose: Nose normal.  Mouth/Throat: Oropharynx is clear and moist.  Eyes: Conjunctivae and EOM are normal. Pupils are equal, round, and reactive to light.  Neck: Normal range of motion. Neck supple. No JVD present. No tracheal deviation present. No thyromegaly present.  Cardiovascular: Normal rate, regular rhythm, normal heart sounds and intact distal pulses.  Exam reveals no gallop and no friction rub.   No murmur heard. Pulmonary/Chest: Effort normal and breath sounds normal. No stridor. No respiratory distress. She has no wheezes. She has no rales. She exhibits no tenderness.  Abdominal: Soft. Bowel sounds are normal. She exhibits no distension and no mass. There is tenderness (tender to palpation in epigastrium). There is no rebound and no  guarding.  Musculoskeletal: Normal range of motion. She exhibits no edema and no tenderness.  Lymphadenopathy:    She has no cervical adenopathy.  Neurological: She is alert and oriented to person, place, and time. She exhibits normal muscle tone. Coordination normal.  Skin: Skin is warm and dry. No rash noted. No erythema. No pallor.  Psychiatric: She has a normal mood and affect. Her behavior is normal. Judgment and thought content normal.    ED Course  Procedures (including critical care time) Labs Review Labs Reviewed  CBC WITH DIFFERENTIAL -  Abnormal; Notable for the following:    Hemoglobin 11.9 (*)    RDW 17.8 (*)    All other components within normal limits  COMPREHENSIVE METABOLIC PANEL  LIPASE, BLOOD  TROPONIN I  CBC WITH DIFFERENTIAL    Imaging Review Dg Chest 2 View  08/26/2013   CLINICAL DATA:  Mid chest and upper abdominal pain.  EXAM: CHEST  2 VIEW  COMPARISON:  Chest x-ray 04/13/2013.  FINDINGS: Lung volumes are normal. No consolidative airspace disease. No pleural effusions. No pneumothorax. No pulmonary nodule or mass noted. Pulmonary vasculature and the cardiomediastinal silhouette are within normal limits.  IMPRESSION: No radiographic evidence of acute cardiopulmonary disease.   Electronically Signed   By: Vinnie Langton M.D.   On: 08/26/2013 00:31   Dg Abd 1 View  08/26/2013   CLINICAL DATA:  Mid chest pain and upper abdominal pain.  EXAM: ABDOMEN - 1 VIEW  COMPARISON:  CT of the abdomen and pelvis 07/05/2013.  FINDINGS: Surgical clips project over the right upper quadrant of the abdomen, suggesting prior cholecystectomy. Gas and stool are seen scattered throughout the colon extending to the level of the distal rectum. No pathologic distension of small bowel is noted. No gross evidence of pneumoperitoneum.  IMPRESSION: 1.  Nonobstructive bowel gas pattern. 2. No pneumoperitoneum.   Electronically Signed   By: Vinnie Langton M.D.   On: 08/26/2013 00:35   And  EKG Interpretation   Date/Time:  Tuesday Aug 25 2013 22:56:46 EDT Ventricular Rate:  80 PR Interval:  117 QRS Duration: 102 QT Interval:  386 QTC Calculation: 445 R Axis:   44 Text Interpretation:  Sinus rhythm Ventricular premature complex  Borderline short PR interval Low voltage, precordial leads Baseline wander  Confirmed by Kolbie Lepkowski  MD, Kooper Chriswell ([id number]) on 08/25/2013 11:08:41 PM      MDM   Final diagnoses:  Epigastric pain    43 year old female with chest pain/epigastric pain.  Pain occurred after vomiting.  She has a gastric sleeve in place.   Plan for labs, chest and abdomen x-ray.  Will try to get in touch with her bariatric surgeon to see if they want further imaging.  1:57 AM D/w Dr Darnell Level, pt's bariatric surgeon.  He wishes to start her on PPI, pain medication, and clear liquid diet.  Pt to see him in the office tomorrow.    Kalman Drape, MD 08/26/13 0157

## 2013-08-25 NOTE — ED Notes (Signed)
Pt states she started having chest pain yesterday after vomiting  Pt states the pain is intermittent and describes as a sharp pinching   Pt states she had her gallbladder removed on April 27th

## 2013-08-26 LAB — COMPREHENSIVE METABOLIC PANEL
ALT: 25 U/L (ref 0–35)
AST: 26 U/L (ref 0–37)
Albumin: 3.6 g/dL (ref 3.5–5.2)
Alkaline Phosphatase: 77 U/L (ref 39–117)
BUN: 9 mg/dL (ref 6–23)
CALCIUM: 9.4 mg/dL (ref 8.4–10.5)
CO2: 21 meq/L (ref 19–32)
CREATININE: 0.66 mg/dL (ref 0.50–1.10)
Chloride: 103 mEq/L (ref 96–112)
GFR calc Af Amer: >90 mL/min (ref >90)
Glucose, Bld: 86 mg/dL (ref 70–99)
Potassium: 3.8 mEq/L (ref 3.7–5.3)
Sodium: 139 mEq/L (ref 137–147)
Total Bilirubin: 0.3 mg/dL (ref 0.3–1.2)
Total Protein: 7.6 g/dL (ref 6.0–8.3)

## 2013-08-26 LAB — CBC WITH DIFFERENTIAL/PLATELET
BASOS ABS: 0 10*3/uL (ref 0.0–0.1)
BASOS PCT: 0 % (ref 0–1)
Eosinophils Absolute: 0.2 10*3/uL (ref 0.0–0.7)
Eosinophils Relative: 2 % (ref 0–5)
HCT: 36.3 % (ref 36.0–46.0)
Hemoglobin: 11.9 g/dL — ABNORMAL LOW (ref 12.0–15.0)
LYMPHS PCT: 30 % (ref 12–46)
Lymphs Abs: 3 10*3/uL (ref 0.7–4.0)
MCH: 26 pg (ref 26.0–34.0)
MCHC: 32.8 g/dL (ref 30.0–36.0)
MCV: 79.3 fL (ref 78.0–100.0)
MONO ABS: 0.9 10*3/uL (ref 0.1–1.0)
Monocytes Relative: 8 % (ref 3–12)
NEUTROS ABS: 6 10*3/uL (ref 1.7–7.7)
NEUTROS PCT: 60 % (ref 43–77)
PLATELETS: 251 10*3/uL (ref 150–400)
RBC: 4.58 MIL/uL (ref 3.87–5.11)
RDW: 17.8 % — AB (ref 11.5–15.5)
WBC: 10.2 10*3/uL (ref 4.0–10.5)

## 2013-08-26 LAB — TROPONIN I

## 2013-08-26 LAB — LIPASE, BLOOD: LIPASE: 31 U/L (ref 11–59)

## 2013-08-26 MED ORDER — HYDROCODONE-ACETAMINOPHEN 7.5-325 MG/15ML PO SOLN: 10.0000 mL | Freq: Four times a day (QID) | ORAL | Status: DC | PRN

## 2013-08-26 MED ORDER — ONDANSETRON 8 MG PO TBDP
8.0000 mg | ORAL_TABLET | Freq: Once | ORAL | Status: AC
  Administered 2013-08-26: 8 mg via ORAL
  Filled 2013-08-26: qty 1

## 2013-08-26 MED ORDER — PANTOPRAZOLE SODIUM 40 MG PO PACK
40.0000 mg | PACK | Freq: Every day | ORAL | Status: DC
  Administered 2013-08-26: 40 mg
  Filled 2013-08-26: qty 20

## 2013-08-26 MED ORDER — ONDANSETRON 8 MG PO TBDP: 8.0000 mg | ORAL_TABLET | Freq: Three times a day (TID) | ORAL | Status: DC | PRN

## 2013-08-26 MED ORDER — PANTOPRAZOLE SODIUM 40 MG PO PACK: 40.0000 mg | PACK | Freq: Every day | ORAL | Status: DC

## 2013-08-26 NOTE — ED Notes (Signed)
Dr Otter at bedside  

## 2013-08-26 NOTE — Discharge Instructions (Signed)
Take medications as prescribed.  Return to the ER for worsening condition or new concerning symptoms.  Stick to a clear liquid diet until seen by Dr Darnell Level.  Call his office in the morning to set up an appointment.

## 2013-08-26 NOTE — ED Notes (Signed)
Unable to obtain PIV access despite attempts by three nursing staff members Dr. Sharol Given is aware

## 2013-10-20 ENCOUNTER — Telehealth: Payer: Self-pay | Admitting: *Deleted

## 2013-10-20 NOTE — Telephone Encounter (Signed)
Pt called requesting Rx for megace due to have IUD removed and inserted on 05/08/13. Pt is bleeding now. Please advise

## 2013-10-20 NOTE — Telephone Encounter (Signed)
Needs annual in Aug. - Will remove IUD.  OV best, if can't come in  - Karen [name] 40 twice daily for 10 days, office visit if persists. She had BTL, morbid obesity.  She was going to have gastric sleeve, did she have?Karen [name]

## 2013-10-21 MED ORDER — MEGESTROL ACETATE 40 MG PO TABS: 40.0000 mg | ORAL_TABLET | Freq: Two times a day (BID) | ORAL | Status: DC

## 2013-10-21 NOTE — Telephone Encounter (Signed)
Error for the below pt has OV scheduled on 11/30/13 for removal and insertion of IUD. Pt unable to ask pt about gastic sleeve, I had to leave a voicemail. I will send rx in pharmacy for megace.

## 2013-10-22 ENCOUNTER — Other Ambulatory Visit: Payer: Self-pay | Admitting: Gynecology

## 2013-10-22 ENCOUNTER — Telehealth: Payer: Self-pay | Admitting: Gynecology

## 2013-10-22 DIAGNOSIS — Z3049 Encounter for surveillance of other contraceptives: Secondary | ICD-10-CM

## 2013-10-22 MED ORDER — LEVONORGESTREL 20 MCG/24HR IU IUD: INTRAUTERINE_SYSTEM | Freq: Once | INTRAUTERINE | Status: DC

## 2013-10-22 NOTE — Telephone Encounter (Signed)
10/22/13-LM VM for pt that her [facility] insurance will cover the removal of old Mirena and insertion of new under her $10.00 specialist copay. Appt is already made with TF/wl

## 2013-11-06 ENCOUNTER — Telehealth: Payer: Self-pay | Admitting: *Deleted

## 2013-11-06 NOTE — Telephone Encounter (Signed)
(  pt aware you are out of the office) Pt called requesting refill on megace, started bleeding this am, has appointment for IUD removal and insertion on 11/30/13. I left a detailed message on pt voicemail that per our last telephone encounter 10/20/13" office visit if persists". Please advise

## 2013-11-09 NOTE — Telephone Encounter (Signed)
Okay for megace BUT does need SHGM with Dr Phineas Real, should be done with or before IUD removal/reinsert.

## 2013-11-10 ENCOUNTER — Other Ambulatory Visit: Payer: Self-pay | Admitting: Gynecology

## 2013-11-10 MED ORDER — MEGESTROL ACETATE 40 MG PO TABS: 40.0000 mg | ORAL_TABLET | Freq: Two times a day (BID) | ORAL | Status: DC

## 2013-11-10 NOTE — Telephone Encounter (Signed)
Pt informed with the below note, rx sent. 

## 2013-11-19 ENCOUNTER — Telehealth: Payer: Self-pay | Admitting: *Deleted

## 2013-11-19 MED ORDER — MEGESTROL ACETATE 40 MG PO TABS: 40.0000 mg | ORAL_TABLET | Freq: Two times a day (BID) | ORAL | Status: DC

## 2013-11-19 NOTE — Telephone Encounter (Signed)
Okay, please refill Megace 40 twice a day, #30.

## 2013-11-19 NOTE — Telephone Encounter (Signed)
rx sent

## 2013-11-19 NOTE — Telephone Encounter (Signed)
Pt has SHGM scheduled on 11/25/13 and removal and placement of new MIRENA IUD on 11/30/13. Pt said she only has 3 pills left of megace, requesting refill. States when she stops pill the the bleeding returns. Please advise

## 2013-11-25 ENCOUNTER — Other Ambulatory Visit: Payer: Self-pay | Admitting: Gynecology

## 2013-11-25 ENCOUNTER — Ambulatory Visit (INDEPENDENT_AMBULATORY_CARE_PROVIDER_SITE_OTHER): Payer: BC Managed Care – PPO

## 2013-11-25 ENCOUNTER — Other Ambulatory Visit: Payer: BC Managed Care – PPO

## 2013-11-25 ENCOUNTER — Ambulatory Visit (HOSPITAL_COMMUNITY)
Admission: RE | Admit: 2013-11-25 | Discharge: 2013-11-25 | Disposition: A | Discharge status: Home / Self Care | Payer: BC Managed Care – PPO | Source: Ambulatory Visit | Attending: Gynecology | Admitting: Gynecology

## 2013-11-25 ENCOUNTER — Ambulatory Visit: Payer: BC Managed Care – PPO | Admitting: Gynecology

## 2013-11-25 ENCOUNTER — Encounter: Payer: Self-pay | Admitting: Gynecology

## 2013-11-25 ENCOUNTER — Ambulatory Visit (INDEPENDENT_AMBULATORY_CARE_PROVIDER_SITE_OTHER): Payer: BC Managed Care – PPO | Admitting: Gynecology

## 2013-11-25 DIAGNOSIS — Z0389 Encounter for observation for other suspected diseases and conditions ruled out: Secondary | ICD-10-CM | POA: Diagnosis present

## 2013-11-25 DIAGNOSIS — N926 Irregular menstruation, unspecified: Secondary | ICD-10-CM

## 2013-11-25 DIAGNOSIS — D259 Leiomyoma of uterus, unspecified: Secondary | ICD-10-CM

## 2013-11-25 DIAGNOSIS — T8389XA Other specified complication of genitourinary prosthetic devices, implants and grafts, initial encounter: Secondary | ICD-10-CM

## 2013-11-25 DIAGNOSIS — N920 Excessive and frequent menstruation with regular cycle: Secondary | ICD-10-CM

## 2013-11-25 DIAGNOSIS — N949 Unspecified condition associated with female genital organs and menstrual cycle: Secondary | ICD-10-CM

## 2013-11-25 DIAGNOSIS — D251 Intramural leiomyoma of uterus: Secondary | ICD-10-CM

## 2013-11-25 DIAGNOSIS — N938 Other specified abnormal uterine and vaginal bleeding: Secondary | ICD-10-CM

## 2013-11-25 NOTE — Patient Instructions (Signed)
Office will arrange x-ray of the abdomen to make sure the IUD is not outside of the uterus, otherwise followup for your scheduled IUD placement exam

## 2013-11-25 NOTE — Progress Notes (Signed)
Karen [name] 1970-08-31 [id number]        43 y.o. patient of Izora Gala with history of BTL and subsequent placement of Mirena IUD for menorrhagia. Did well until this past December when she had episodes of heavy bleeding and clots. She subsequently saw Izora Gala in January 2015 due to heavy irregular bleeding and was treated with Megace. She is required repetitive doses of Megace to control bleeding since then. Of note her 11/2012 annual exam showed the IUD strings at the cervical os. Her 04/2013 exam did not show the IUD strings. The IUD historically was placed 05/2008 and is overdue to be replaced. She has an appointment to do so next week.  Past medical history,surgical history, problem list, medications, allergies, family history and social history were all reviewed and documented in the EPIC chart.  Directed ROS with pertinent positives and negatives documented in the history of present illness/assessment and plan.  Exam: [place] assistant General appearance:  Normal External BUS vagina normal. Cervix normal with no IUD strings visualized. Bimanual grossly normal unable to palpate uterus due to abdominal girth. Adnexa without gross masses or tenderness  Ultrasound shows uterus overall normal size. 2 small myomas measured at 22 mm and 16 mm. Right and left ovaries normal. Cul-de-sac negative. Endometrial echo 3.5 mm with no IUD visualized.  Sonohysterogram was performed, sterile technique, easy catheter introduction, good distention with no intracavitary abnormalities noted. No IUD was visualized. Endometrial sample was taken. Patient tolerated well.  Assessment/Plan:  43 y.o. with history as above. I suspect that she passed her IUD in December when she had heavy bleeding with clots. We'll obtain flat plate of abdomen to make sure it is not intra-abdominal. Assuming it is not she will followup for her scheduled Mirena IUD placement this coming Monday.   Note: This document was prepared with  digital dictation and possible smart phrase technology. Any transcriptional errors that result from this process are unintentional.   Anastasio Auerbach MD, 12:14 PM 11/25/2013

## 2013-11-30 ENCOUNTER — Ambulatory Visit: Payer: BC Managed Care – PPO | Admitting: Gynecology

## 2013-12-03 ENCOUNTER — Telehealth: Payer: Self-pay | Admitting: *Deleted

## 2013-12-03 MED ORDER — MEGESTROL ACETATE 40 MG PO TABS: 40.0000 mg | ORAL_TABLET | Freq: Two times a day (BID) | ORAL | Status: DC

## 2013-12-03 NOTE — Telephone Encounter (Signed)
rx sent, pt informed with this as well.

## 2013-12-03 NOTE — Telephone Encounter (Signed)
Ok please call in,

## 2013-12-03 NOTE — Telephone Encounter (Signed)
Pt calling requesting refill on megace 40 mg, scheduled for IUD placement on 12/07/13. Please advise

## 2013-12-07 ENCOUNTER — Ambulatory Visit (INDEPENDENT_AMBULATORY_CARE_PROVIDER_SITE_OTHER): Payer: BC Managed Care – PPO | Admitting: Gynecology

## 2013-12-07 ENCOUNTER — Encounter: Payer: Self-pay | Admitting: Gynecology

## 2013-12-07 DIAGNOSIS — N920 Excessive and frequent menstruation with regular cycle: Secondary | ICD-10-CM

## 2013-12-07 DIAGNOSIS — Z30430 Encounter for insertion of intrauterine contraceptive device: Secondary | ICD-10-CM

## 2013-12-07 NOTE — Patient Instructions (Signed)
Intrauterine Device Insertion Most often, an intrauterine device (IUD) is inserted into the uterus to prevent pregnancy. There are 2 types of IUDs available:  Copper IUD--This type of IUD creates an environment that is not favorable to sperm survival. The mechanism of action of the copper IUD is not known for certain. It can stay in place for 10 years.  Hormone IUD--This type of IUD contains the hormone progestin (synthetic progesterone). The progestin thickens the cervical mucus and prevents sperm from entering the uterus, and it also thins the uterine lining. There is no evidence that the hormone IUD prevents implantation. One hormone IUD can stay in place for up to 5 years, and a different hormone IUD can stay in place for up to 3 years. An IUD is the most cost-effective birth control if left in place for the full duration. It may be removed at any time. LET YOUR HEALTH CARE PROVIDER KNOW ABOUT:  Any allergies you have.  All medicines you are taking, including vitamins, herbs, eye drops, creams, and over-the-counter medicines.  Previous problems you or members of your family have had with the use of anesthetics.  Any blood disorders you have.  Previous surgeries you have had.  Possibility of pregnancy.  Medical conditions you have. RISKS AND COMPLICATIONS  Generally, intrauterine device insertion is a safe procedure. However, as with any procedure, complications can occur. Possible complications include:  Accidental puncture (perforation) of the uterus.  Accidental placement of the IUD either in the muscle layer of the uterus (myometrium) or outside the uterus. If this happens, the IUD can be found essentially floating around the bowels and must be taken out surgically.  The IUD may fall out of the uterus (expulsion). This is more common in women who have recently had a child.   Pregnancy in the fallopian tube (ectopic).  Pelvic inflammatory disease (PID), which is infection of  the uterus and fallopian tubes. The risk of PID is slightly increased in the first 20 days after the IUD is placed, but the overall risk is still very low. BEFORE THE PROCEDURE  Schedule the IUD insertion for when you will have your menstrual period or right after, to make sure you are not pregnant. Placement of the IUD is better tolerated shortly after a menstrual cycle.  You may need to take tests or be examined to make sure you are not pregnant.  You may be required to take a pregnancy test.  You may be required to get checked for sexually transmitted infections (STIs) prior to placement. Placing an IUD in someone who has an infection can make the infection worse.  You may be given a pain reliever to take 1 or 2 hours before the procedure.  An exam will be performed to determine the size and position of your uterus.  Ask your health care provider about changing or stopping your regular medicines. PROCEDURE   A tool (speculum) is placed in the vagina. This allows your health care provider to see the lower part of the uterus (cervix).  The cervix is prepped with a medicine that lowers the risk of infection.  You may be given a medicine to numb each side of the cervix (intracervical or paracervical block). This is used to block and control any discomfort with insertion.  A tool (uterine sound) is inserted into the uterus to determine the length of the uterine cavity and the direction the uterus may be tilted.  A slim instrument (IUD inserter) is inserted through the cervical   canal and into your uterus.  The IUD is placed in the uterine cavity and the insertion device is removed.  The nylon string that is attached to the IUD and used for eventual IUD removal is trimmed. It is trimmed so that it lays high in the vagina, just outside the cervix. AFTER THE PROCEDURE  You may have bleeding after the procedure. This is normal. It varies from light spotting for a few days to menstrual-like  bleeding.  You may have mild cramping. Document Released: 11/22/2010 Document Revised: 01/14/2013 Document Reviewed: 09/14/2012 Exit[employer] Patient Information 2015 [employer]. This information is not intended to replace advice given to you by your health care provider. Make sure you discuss any questions you have with your health care provider.  

## 2013-12-07 NOTE — Progress Notes (Signed)
Patient presents for Mirena IUD placement. She has read through the booklet, has no contraindications and signed the consent form. She currently is on Megace for menstrual suppression due to her history of heavy bleeding. She is status post tubal sterilization in the past.  I reviewed the insertional process with her as well as the risks to include infection, either immediate or long-term, uterine perforation or migration requiring surgery to remove, other complications such as pain, hormonal side effects and possibility of failure with subsequent pregnancy.   Exam with [facility] assistant Pelvic: External BUS vagina normal. Cervix normal . Uterus grossly normal size midline mobile nontender. Adnexa without masses or tenderness.  Procedure: The cervix was cleansed with Betadine, anterior lip grasped with a single-tooth tenaculum, the uterus was sounded and a Mirena IUD was placed according to manufacturer's recommendations without difficulty. The strings were trimmed. The patient tolerated well and will follow up in one month for a postinsertional check.  Lot number:  [id number]  Note: This document was prepared with digital dictation and possible smart phrase technology. Any transcriptional errors that result from this process are unintentional.  Anastasio Auerbach MD, 11:22 AM 12/07/2013

## 2013-12-08 ENCOUNTER — Encounter: Payer: Self-pay | Admitting: Gynecology

## 2014-01-04 ENCOUNTER — Telehealth: Payer: Self-pay | Admitting: *Deleted

## 2014-01-04 NOTE — Telephone Encounter (Signed)
Pt had IUD placed on 12/07/13 said IUD fell out last night, she would like to know if Depo provera is an option for her? Please advise

## 2014-01-05 NOTE — Telephone Encounter (Signed)
Patient needs office visit.  

## 2014-01-05 NOTE — Telephone Encounter (Signed)
Pt informed with the below note. 

## 2014-01-07 ENCOUNTER — Ambulatory Visit (INDEPENDENT_AMBULATORY_CARE_PROVIDER_SITE_OTHER): Payer: BC Managed Care – PPO | Admitting: Gynecology

## 2014-01-07 ENCOUNTER — Encounter: Payer: Self-pay | Admitting: Gynecology

## 2014-01-07 DIAGNOSIS — Z3042 Encounter for surveillance of injectable contraceptive: Secondary | ICD-10-CM

## 2014-01-07 DIAGNOSIS — N921 Excessive and frequent menstruation with irregular cycle: Secondary | ICD-10-CM

## 2014-01-07 DIAGNOSIS — T8389XA Other specified complication of genitourinary prosthetic devices, implants and grafts, initial encounter: Secondary | ICD-10-CM

## 2014-01-07 MED ORDER — MEGESTROL ACETATE 40 MG PO TABS: 40.0000 mg | ORAL_TABLET | Freq: Two times a day (BID) | ORAL | Status: DC

## 2014-01-07 MED ORDER — MEDROXYPROGESTERONE ACETATE 150 MG/ML IM SUSP
150.0000 mg | Freq: Once | INTRAMUSCULAR | Status: AC
  Administered 2014-01-07: 150 mg via INTRAMUSCULAR

## 2014-01-07 NOTE — Patient Instructions (Signed)
Use the Megace for the next couple days twice daily to control the bleeding. If we do well with the Depo-Provera and follow up in 3 months for your next injection. Sooner if any issues.

## 2014-01-07 NOTE — Addendum Note (Signed)
Addended by: Nelva Nay on: 01/07/2014 09:22 AM   Modules accepted: Orders

## 2014-01-07 NOTE — Progress Notes (Signed)
Karen [name] [birth year] [id number]        43 y.o. G2 P2 presents complaining that her Mirena IUD fell out. She started menses this past weekend bled heavily and passed her Mirena IUD. She brought it with her.  History of menorrhagia treated intermittently with Megace. Had Mirena IUD previously which she did well with up until last year when she spontaneously passed it. Second IUD placed a month ago. She is status post tubal sterilization historically as an interval procedure.  Most recently a month ago underwent sonohysterogram which showed a normal-appearing cavity and several small leiomyoma intramural. Endometrial biopsy was benign. Patient interested in trying Depo-Provera for menstrual suppression.  Past medical history,surgical history, problem list, medications, allergies, family history and social history were all reviewed and documented in the EPIC chart.  Directed ROS with pertinent positives and negatives documented in the history of present illness/assessment and plan.  Exam: [facility] assistant General appearance:  Normal External BUS vagina with clots in the vagina. Clots were cleared without active heavy bleeding noted. Cervix grossly normal. Uterus grossly normal midline mobile nontender. Adnexa without masses or tenderness.  Mirena IUD noted the patient brought with her and discarded.  Assessment/Plan:  43 y.o. G2 P2 status post tubal sterilization historically with menorrhagia. Start Megace 40 mg twice a day over the next day or 2 to stop the heavy bleeding. Reviewed options for long-term management to include trial of Depo-Provera, endometrial ablation, hysterectomy. Possible retrial of IUD although she has passed 2 of them now spontaneously. At this point patient was trial of Depo-Provera. I discussed the side effect profile with the medication which the patient accepts and Depo-Provera 150 mg were given today. We will see how she does with this and she will follow up in 3 months for  her next shot assuming she does well.     Anastasio Auerbach MD, 8:40 AM 01/07/2014

## 2014-01-18 ENCOUNTER — Other Ambulatory Visit: Payer: Self-pay

## 2014-01-19 ENCOUNTER — Encounter: Payer: BC Managed Care – PPO | Admitting: Women's Health

## 2014-01-20 ENCOUNTER — Other Ambulatory Visit: Payer: Self-pay

## 2014-01-22 ENCOUNTER — Other Ambulatory Visit: Payer: Self-pay

## 2014-01-22 NOTE — Telephone Encounter (Signed)
Refill has already been denied twice for Megace. Left message with pharmacy that patient needs to contact the provider first.

## 2014-02-08 ENCOUNTER — Encounter: Payer: Self-pay | Admitting: Gynecology

## 2014-03-01 ENCOUNTER — Telehealth: Payer: Self-pay

## 2014-03-01 MED ORDER — MEGESTROL ACETATE 20 MG PO TABS: ORAL_TABLET | ORAL | Status: DC

## 2014-03-01 NOTE — Telephone Encounter (Signed)
Rx sent. Patient informed. 

## 2014-03-01 NOTE — Telephone Encounter (Signed)
Patient states she has been bleeding since Nov 1 and is requesting Megace prescription be called in for her.

## 2014-03-01 NOTE — Telephone Encounter (Signed)
Megace 20 mg #30 one by mouth twice a day times several days then once daily 7 days

## 2014-03-27 ENCOUNTER — Other Ambulatory Visit: Payer: Self-pay | Admitting: Gynecology

## 2014-04-08 ENCOUNTER — Telehealth: Payer: Self-pay

## 2014-04-08 ENCOUNTER — Other Ambulatory Visit: Payer: Self-pay

## 2014-04-08 MED ORDER — MEDROXYPROGESTERONE ACETATE 150 MG/ML IM SUSP: 150.0000 mg | Freq: Once | INTRAMUSCULAR | Status: DC

## 2014-04-08 NOTE — Telephone Encounter (Signed)
error 

## 2014-05-13 ENCOUNTER — Telehealth: Payer: Self-pay | Admitting: *Deleted

## 2014-05-13 ENCOUNTER — Other Ambulatory Visit: Payer: Self-pay | Admitting: Women's Health

## 2014-05-13 DIAGNOSIS — N938 Other specified abnormal uterine and vaginal bleeding: Secondary | ICD-10-CM

## 2014-05-13 MED ORDER — MEGESTROL ACETATE 40 MG PO TABS: 40.0000 mg | ORAL_TABLET | Freq: Two times a day (BID) | ORAL | Status: DC

## 2014-05-13 NOTE — Telephone Encounter (Signed)
Telephone call, BTL, second dose of Depo-Provera, cycle in December heavy  one week, having cycle now heavy not appearing to stop, which has been the problem of long heavy cycles.  Options reviewed, will try Megace 40 twice daily for 5 days and daily if needed. Negative sonohysterogram, reviewed ablation, old insurance did not cover, will call Abigail Butts tomorrow with insurance information to check coverage. Had gastric sleeve surgery one year ago has lost 125 pounds doing well.

## 2014-05-13 NOTE — Telephone Encounter (Signed)
Message left

## 2014-05-13 NOTE — Telephone Encounter (Signed)
Pt called requesting refill on megace 40 mg, on depo provera, received last depo in Jan. ( injection was done at home). Pt thought depo would help with bleeding, still bleeding heavy every month. Please advise

## 2014-05-18 ENCOUNTER — Telehealth: Payer: Self-pay

## 2014-05-18 NOTE — Telephone Encounter (Signed)
I called patient and left detailed message on home ans machine. Her ins. has a $500 deductible and once that is met it pays 100%. Her financial responsibility will be $262.  I told her to call me back if this is something she is interested in moving forward with scheduling.

## 2014-05-22 ENCOUNTER — Other Ambulatory Visit: Payer: Self-pay | Admitting: Gynecology

## 2014-06-03 ENCOUNTER — Other Ambulatory Visit: Payer: Self-pay

## 2014-06-03 DIAGNOSIS — N938 Other specified abnormal uterine and vaginal bleeding: Secondary | ICD-10-CM

## 2014-06-03 MED ORDER — MEGESTROL ACETATE 40 MG PO TABS: 40.0000 mg | ORAL_TABLET | Freq: Two times a day (BID) | ORAL | Status: DC

## 2014-06-03 NOTE — Telephone Encounter (Signed)
Message left

## 2014-06-03 NOTE — Telephone Encounter (Signed)
Telephone call, states bleeding has slowed, did request refill just in case, is in the process of scheduling ablation.

## 2014-06-04 ENCOUNTER — Telehealth: Payer: Self-pay

## 2014-06-04 NOTE — Telephone Encounter (Signed)
Patient called in voice mail stating she had some questions regarding endometrial ablation in the office. Recovery? How long in the office for procedure. I called her back and left answers on her voice mail and asked her to call me if any other questions.

## 2014-06-10 ENCOUNTER — Telehealth: Payer: Self-pay | Admitting: *Deleted

## 2014-06-10 MED ORDER — MEDROXYPROGESTERONE ACETATE 150 MG/ML IM SUSP: 150.0000 mg | Freq: Once | INTRAMUSCULAR | Status: DC

## 2014-06-10 NOTE — Telephone Encounter (Signed)
Pt called requesting refill on depo provera, Rx sent.

## 2014-07-09 ENCOUNTER — Telehealth: Payer: Self-pay | Admitting: *Deleted

## 2014-07-09 DIAGNOSIS — N938 Other specified abnormal uterine and vaginal bleeding: Secondary | ICD-10-CM

## 2014-07-09 MED ORDER — MEDROXYPROGESTERONE ACETATE 150 MG/ML IM SUSP: 150.0000 mg | Freq: Once | INTRAMUSCULAR | Status: DC

## 2014-07-09 MED ORDER — MEGESTROL ACETATE 40 MG PO TABS: 40.0000 mg | ORAL_TABLET | Freq: Two times a day (BID) | ORAL | Status: DC

## 2014-07-09 NOTE — Telephone Encounter (Signed)
Left detailed message on pt voicemail, Rx sent, asked her to please schedule annual.

## 2014-07-09 NOTE — Telephone Encounter (Signed)
Pt called requesting refill on Depo Provera, the last annual I see is in Dec. 2014 okay to refill? Pt also called requesting refill on megace? Please advise

## 2014-07-09 NOTE — Telephone Encounter (Signed)
Okay to refill both, have her schedule annual exam. Her problem is menorrhagia, (needs a hysterectomy, surgical risk).

## 2014-07-12 ENCOUNTER — Other Ambulatory Visit (INDEPENDENT_AMBULATORY_CARE_PROVIDER_SITE_OTHER): Payer: 59

## 2014-07-12 DIAGNOSIS — Z3042 Encounter for surveillance of injectable contraceptive: Secondary | ICD-10-CM | POA: Diagnosis not present

## 2014-07-12 MED ORDER — MEDROXYPROGESTERONE ACETATE 150 MG/ML IM SUSP
150.0000 mg | Freq: Once | INTRAMUSCULAR | Status: AC
Start: 2014-07-12 — End: 2014-07-12
  Administered 2014-07-12: 150 mg via INTRAMUSCULAR

## 2014-07-12 NOTE — Progress Notes (Signed)
Pt stated last injection 04/12/2014 given at home. Advised to schedule AEX with next injection due btwn June 20- July 4. KW CMA

## 2014-07-30 ENCOUNTER — Other Ambulatory Visit: Payer: Self-pay

## 2014-07-30 DIAGNOSIS — N938 Other specified abnormal uterine and vaginal bleeding: Secondary | ICD-10-CM

## 2014-07-30 MED ORDER — MEGESTROL ACETATE 40 MG PO TABS: 40.0000 mg | ORAL_TABLET | Freq: Two times a day (BID) | ORAL | Status: DC

## 2014-07-30 NOTE — Telephone Encounter (Signed)
Has yearly exam scheduled with you for 09/27/14.

## 2014-07-31 NOTE — Op Note (Signed)
PATIENT NAME:  Karen [name], Karen [name] MR#:  [id number] DATE OF BIRTH:  08-30-1970  DATE OF PROCEDURE:  05/28/2013  PREOPERATIVE DIAGNOSES: Morbid obesity and gallstones.   POSTOPERATIVE DIAGNOSES: Morbid obesity and hiatal hernia.   PROCEDURE: Laparoscopic sleeve gastrectomy with hiatal hernia repair.   SURGEON: Kreg Shropshire, MD  ASSISTANT: Mardelle Matte, PA-C  COMPLICATIONS: None.   ANESTHESIA: General endotracheal.   FINDINGS: Moderate hiatal hernia.   CLINICAL HISTORY: See H and P.   ESTIMATED BLOOD LOSS: None.  DETAILS OF PROCEDURE:  The patient was taken to the operating room and placed on the operating room table, in the supine position, with appropriate monitors and supplemental oxygen being delivered.  Broad spectrum IV antibiotics were administered. The patient was placed under general anesthesia without incident.  The abdomen was prepped and draped in the usual sterile fashion.  Access was obtained using 5 mm Optical trocar. Pneumoperitoneum was established without difficulty. Multiple other ports were placed in preparation for sleeve gastrectomy. A liver retractor was placed without incident. The entire stomach was mobilized from 5 cm from the pylorus all the way up to the fundus, and the fundus was mobilized off the left crura as well completely freeing up the posterior portion of the stomach. Posterior attachments were taken down so the crura could be visualized from both sides.  At that point, everything was removed from the stomach and a 51 [place] Bougie was placed down into the antrum. An Echelon green load stapler was used to bisect the antrum on first fire starting approximately 5 to 6 cm from the pylorus. I then continued up along the Bougie using a blue load stapler with excellent affect with care not to get too close to the Bougie itself, with minimal traction. This continued all the way up to the left crura. The excess stomach was placed on the side and the Bougie was removed and  endoscopy showed no evidence of obstruction at that time.  The excess stomach was removed through the abdominal cavity, and the wounds were closed using 4-0 Vicryl and Dermabond.   ADDENDUM: The hiatus was explored. A moderate hiatal hernia was present that was closed using interrupted Ethibond sutures with excellent effect. The gallbladder was not taken out as the patient's body habitus was not suitable to removing it from the right side, and the liver was fairly large on the right side.   ____________________________ Kreg Shropshire, MD jb:lb D: 05/28/2013 13:16:00 ET T: 05/28/2013 13:23:27 ET JOB#: [id number]  cc: Kreg Shropshire, MD, <Dictator> Wille Glaser Langley Adie MD ELECTRONICALLY SIGNED 05/28/2013 15:51

## 2014-09-22 ENCOUNTER — Other Ambulatory Visit: Payer: Self-pay

## 2014-09-22 DIAGNOSIS — N938 Other specified abnormal uterine and vaginal bleeding: Secondary | ICD-10-CM

## 2014-09-22 MED ORDER — MEGESTROL ACETATE 40 MG PO TABS: 40.0000 mg | ORAL_TABLET | Freq: Two times a day (BID) | ORAL | Status: DC

## 2014-09-22 NOTE — Telephone Encounter (Signed)
Annual exam is scheduled Monday September 27, 2014.

## 2014-09-22 NOTE — Telephone Encounter (Signed)
Message left

## 2014-09-27 ENCOUNTER — Ambulatory Visit (INDEPENDENT_AMBULATORY_CARE_PROVIDER_SITE_OTHER): Payer: 59 | Admitting: Gynecology

## 2014-09-27 ENCOUNTER — Encounter: Payer: 59 | Admitting: Gynecology

## 2014-09-27 DIAGNOSIS — Z3042 Encounter for surveillance of injectable contraceptive: Secondary | ICD-10-CM

## 2014-09-27 MED ORDER — MEDROXYPROGESTERONE ACETATE 150 MG/ML IM SUSP
150.0000 mg | Freq: Once | INTRAMUSCULAR | Status: AC
  Administered 2014-09-27: 150 mg via INTRAMUSCULAR

## 2014-11-10 ENCOUNTER — Other Ambulatory Visit: Payer: Self-pay

## 2014-11-10 DIAGNOSIS — N938 Other specified abnormal uterine and vaginal bleeding: Secondary | ICD-10-CM

## 2014-11-10 MED ORDER — MEGESTROL ACETATE 40 MG PO TABS: 40.0000 mg | ORAL_TABLET | Freq: Two times a day (BID) | ORAL | Status: DC

## 2014-11-10 NOTE — Telephone Encounter (Signed)
Left detailed message on cell phone voice mail that Izora Gala will refill Rx this one last time. Must schedule annual exam and keep that appointment. No more refills after this one without CE.

## 2014-11-10 NOTE — Telephone Encounter (Signed)
Have her schedule annual exam, will only refill one more time, must schedule.

## 2014-12-14 ENCOUNTER — Telehealth: Payer: Self-pay | Admitting: *Deleted

## 2014-12-14 MED ORDER — MEDROXYPROGESTERONE ACETATE 150 MG/ML IM SUSP: 150.0000 mg | Freq: Once | INTRAMUSCULAR | Status: DC

## 2014-12-14 NOTE — Telephone Encounter (Signed)
Pt has annual scheduled with you on 02/07/15, last seen in 11/2012 for annual. Pt requesting refill for depo provera. Okay to fill?

## 2014-12-14 NOTE — Telephone Encounter (Signed)
Pt is Rx sent.

## 2014-12-14 NOTE — Telephone Encounter (Signed)
Okay as long as she is within the 12 week window of her last shot.

## 2015-02-07 ENCOUNTER — Encounter: Payer: Self-pay | Admitting: Gynecology

## 2015-02-22 ENCOUNTER — Encounter: Payer: Self-pay | Admitting: Women's Health

## 2015-02-22 ENCOUNTER — Other Ambulatory Visit (HOSPITAL_COMMUNITY)
Admission: RE | Admit: 2015-02-22 | Discharge: 2015-02-22 | Disposition: A | Discharge status: Home / Self Care | Payer: 59 | Source: Ambulatory Visit | Attending: Women's Health | Admitting: Women's Health

## 2015-02-22 ENCOUNTER — Ambulatory Visit (INDEPENDENT_AMBULATORY_CARE_PROVIDER_SITE_OTHER): Payer: 59 | Admitting: Women's Health

## 2015-02-22 VITALS — BP 132/80 | Ht 63.0 in | Wt 324.0 lb

## 2015-02-22 DIAGNOSIS — Z01419 Encounter for gynecological examination (general) (routine) without abnormal findings: Secondary | ICD-10-CM

## 2015-02-22 DIAGNOSIS — Z1151 Encounter for screening for human papillomavirus (HPV): Secondary | ICD-10-CM | POA: Insufficient documentation

## 2015-02-22 DIAGNOSIS — N920 Excessive and frequent menstruation with regular cycle: Secondary | ICD-10-CM | POA: Diagnosis not present

## 2015-02-22 DIAGNOSIS — N938 Other specified abnormal uterine and vaginal bleeding: Secondary | ICD-10-CM | POA: Diagnosis not present

## 2015-02-22 HISTORY — DX: Excessive and frequent menstruation with regular cycle: N92.0

## 2015-02-22 MED ORDER — MEDROXYPROGESTERONE ACETATE 150 MG/ML IM SUSP: 150.0000 mg | Freq: Once | INTRAMUSCULAR | Status: DC

## 2015-02-22 MED ORDER — MEGESTROL ACETATE 40 MG PO TABS: 40.0000 mg | ORAL_TABLET | Freq: Two times a day (BID) | ORAL | Status: DC

## 2015-02-22 NOTE — Patient Instructions (Signed)
Menorrhagia Menorrhagia is the medical term for when your menstrual periods are heavy or last longer than usual. With menorrhagia, every period you have may cause enough blood loss and cramping that you are unable to maintain your usual activities. CAUSES  In some cases, the cause of heavy periods is unknown, but a number of conditions may cause menorrhagia. Common causes include:  A problem with the hormone-producing thyroid gland (hypothyroid).  Noncancerous growths in the uterus (polyps or fibroids).  An imbalance of the estrogen and progesterone hormones.  One of your ovaries not releasing an egg during one or more months.  Side effects of having an intrauterine device (IUD).  Side effects of some medicines, such as anti-inflammatory medicines or blood thinners.  A bleeding disorder that stops your blood from clotting normally. SIGNS AND SYMPTOMS  During a normal period, bleeding lasts between 4 and 8 days. Signs that your periods are too heavy include:  You routinely have to change your pad or tampon every 1 or 2 hours because it is completely soaked.  You pass blood clots larger than 1 inch (2.5 cm) in size.  You have bleeding for more than 7 days.  You need to use pads and tampons at the same time because of heavy bleeding.  You need to wake up to change your pads or tampons during the night.  You have symptoms of anemia, such as tiredness, fatigue, or shortness of breath. DIAGNOSIS  Your health care provider will perform a physical exam and ask you questions about your symptoms and menstrual history. Other tests may be ordered based on what the health care provider finds during the exam. These tests can include:  Blood tests. Blood tests are used to check if you are pregnant or have hormonal changes, a bleeding or thyroid disorder, low iron levels (anemia), or other problems.  Endometrial biopsy. Your health care provider takes a sample of tissue from the inside of your  uterus to be examined under a microscope.  Pelvic ultrasound. This test uses sound waves to make a picture of your uterus, ovaries, and vagina. The pictures can show if you have fibroids or other growths.  Hysteroscopy. For this test, your health care provider will use a small telescope to look inside your uterus. Based on the results of your initial tests, your health care provider may recommend further testing. TREATMENT  Treatment may not be needed. If it is needed, your health care provider may recommend treatment with one or more medicines first. If these do not reduce bleeding enough, a surgical treatment might be an option. The best treatment for you will depend on:   Whether you need to prevent pregnancy.  Your desire to have children in the future.  The cause and severity of your bleeding.  Your opinion and personal preference.  Medicines for menorrhagia may include:  Birth control methods that use hormones. These include the pill, skin patch, vaginal ring, shots that you get every 3 months, hormonal IUD, and implant. These treatments reduce bleeding during your menstrual period.  Medicines that thicken blood and slow bleeding.  Medicines that reduce swelling, such as ibuprofen.  Medicines that contain a synthetic hormone called progestin.   Medicines that make the ovaries stop working for a short time.  You may need surgical treatment for menorrhagia if the medicines are unsuccessful. Treatment options include:  Dilation and curettage (D&C). In this procedure, your health care provider opens (dilates) your cervix and then scrapes or suctions tissue from  the lining of your uterus to reduce menstrual bleeding.  Operative hysteroscopy. This procedure uses a tiny tube with a light (hysteroscope) to view your uterine cavity and can help in the surgical removal of a polyp that may be causing heavy periods.  Endometrial ablation. Through various techniques, your health care  provider permanently destroys the entire lining of your uterus (endometrium). After endometrial ablation, most women have little or no menstrual flow. Endometrial ablation reduces your ability to become pregnant.  Endometrial resection. This surgical procedure uses an electrosurgical wire loop to remove the lining of the uterus. This procedure also reduces your ability to become pregnant.  Hysterectomy. Surgical removal of the uterus and cervix is a permanent procedure that stops menstrual periods. Pregnancy is not possible after a hysterectomy. This procedure requires anesthesia and hospitalization. HOME CARE INSTRUCTIONS   Only take over-the-counter or prescription medicines as directed by your health care provider. Take prescribed medicines exactly as directed. Do not change or switch medicines without consulting your health care provider.  Take any prescribed iron pills exactly as directed by your health care provider. Long-term heavy bleeding may result in low iron levels. Iron pills help replace the iron your body lost from heavy bleeding. Iron may cause constipation. If this becomes a problem, increase the bran, fruits, and roughage in your diet.  Do not take aspirin or medicines that contain aspirin 1 week before or during your menstrual period. Aspirin may make the bleeding worse.  If you need to change your sanitary pad or tampon more than once every 2 hours, stay in bed and rest as much as possible until the bleeding stops.  Eat well-balanced meals. Eat foods high in iron. Examples are leafy green vegetables, meat, liver, eggs, and whole grain breads and cereals. Do not try to lose weight until the abnormal bleeding has stopped and your blood iron level is back to normal. SEEK MEDICAL CARE IF:   You soak through a pad or tampon every 1 or 2 hours, and this happens every time you have a period.  You need to use pads and tampons at the same time because you are bleeding so much.  You  need to change your pad or tampon during the night.  You have a period that lasts for more than 8 days.  You pass clots bigger than 1 inch wide.  You have irregular periods that happen more or less often than once a month.  You feel dizzy or faint.  You feel very weak or tired.  You feel short of breath or feel your heart is beating too fast when you exercise.  You have nausea and vomiting or diarrhea while you are taking your medicine.  You have any problems that may be related to the medicine you are taking. SEEK IMMEDIATE MEDICAL CARE IF:   You soak through 4 or more pads or tampons in 2 hours.  You have any bleeding while you are pregnant. MAKE SURE YOU:   Understand these instructions.  Will watch your condition.  Will get help right away if you are not doing well or get worse.   This information is not intended to replace advice given to you by your health care provider. Make sure you discuss any questions you have with your health care provider.   Document Released: 03/26/2005 Document Revised: 03/31/2013 Document Reviewed: 09/14/2012 Elsevier Interactive Patient Education 2016 [place] Maintenance, Female Adopting a healthy lifestyle and getting preventive care can go a long way  to promote health and wellness. Talk with your health care provider about what schedule of regular examinations is right for you. This is a good chance for you to check in with your provider about disease prevention and staying healthy. In between checkups, there are plenty of things you can do on your own. Experts have done a lot of research about which lifestyle changes and preventive measures are most likely to keep you healthy. Ask your health care provider for more information. WEIGHT AND DIET  Eat a healthy diet  Be sure to include plenty of vegetables, fruits, low-fat dairy products, and lean protein.  Do not eat a lot of foods high in solid fats, added sugars, or  salt.  Get regular exercise. This is one of the most important things you can do for your health.  Most adults should exercise for at least 150 minutes each week. The exercise should increase your heart rate and make you sweat (moderate-intensity exercise).  Most adults should also do strengthening exercises at least twice a week. This is in addition to the moderate-intensity exercise.  Maintain a healthy weight  Body mass index (BMI) is a measurement that can be used to identify possible weight problems. It estimates body fat based on height and weight. Your health care provider can help determine your BMI and help you achieve or maintain a healthy weight.  For females 37 years of age and older:   A BMI below 18.5 is considered underweight.  A BMI of 18.5 to 24.9 is normal.  A BMI of 25 to 29.9 is considered overweight.  A BMI of 30 and above is considered obese.  Watch levels of cholesterol and blood lipids  You should start having your blood tested for lipids and cholesterol at 45 years of age, then have this test every 5 years.  You may need to have your cholesterol levels checked more often if:  Your lipid or cholesterol levels are high.  You are older than 45 years of age.  You are at high risk for heart disease.  CANCER SCREENING   Lung Cancer  Lung cancer screening is recommended for adults 43-97 years old who are at high risk for lung cancer because of a history of smoking.  A yearly low-dose CT scan of the lungs is recommended for people who:  Currently smoke.  Have quit within the past 15 years.  Have at least a 30-pack-year history of smoking. A pack year is smoking an average of one pack of cigarettes a day for 1 year.  Yearly screening should continue until it has been 15 years since you quit.  Yearly screening should stop if you develop a health problem that would prevent you from having lung cancer treatment.  Breast Cancer  Practice breast  self-awareness. This means understanding how your breasts normally appear and feel.  It also means doing regular breast self-exams. Let your health care provider know about any changes, no matter how small.  If you are in your 20s or 30s, you should have a clinical breast exam (CBE) by a health care provider every 1-3 years as part of a regular health exam.  If you are 74 or older, have a CBE every year. Also consider having a breast X-ray (mammogram) every year.  If you have a family history of breast cancer, talk to your health care provider about genetic screening.  If you are at high risk for breast cancer, talk to your health care provider about having  an MRI and a mammogram every year.  Breast cancer gene (BRCA) assessment is recommended for women who have family members with BRCA-related cancers. BRCA-related cancers include:  Breast.  Ovarian.  Tubal.  Peritoneal cancers.  Results of the assessment will determine the need for genetic counseling and BRCA1 and BRCA2 testing. Cervical Cancer Your health care provider may recommend that you be screened regularly for cancer of the pelvic organs (ovaries, uterus, and vagina). This screening involves a pelvic examination, including checking for microscopic changes to the surface of your cervix (Pap test). You may be encouraged to have this screening done every 3 years, beginning at age 52.  For women ages 30-65, health care providers may recommend pelvic exams and Pap testing every 3 years, or they may recommend the Pap and pelvic exam, combined with testing for human papilloma virus (HPV), every 5 years. Some types of HPV increase your risk of cervical cancer. Testing for HPV may also be done on women of any age with unclear Pap test results.  Other health care providers may not recommend any screening for nonpregnant women who are considered low risk for pelvic cancer and who do not have symptoms. Ask your health care provider if a  screening pelvic exam is right for you.  If you have had past treatment for cervical cancer or a condition that could lead to cancer, you need Pap tests and screening for cancer for at least 20 years after your treatment. If Pap tests have been discontinued, your risk factors (such as having a new sexual partner) need to be reassessed to determine if screening should resume. Some women have medical problems that increase the chance of getting cervical cancer. In these cases, your health care provider may recommend more frequent screening and Pap tests. Colorectal Cancer  This type of cancer can be detected and often prevented.  Routine colorectal cancer screening usually begins at 45 years of age and continues through 45 years of age.  Your health care provider may recommend screening at an earlier age if you have risk factors for colon cancer.  Your health care provider may also recommend using home test kits to check for hidden blood in the stool.  A small camera at the end of a tube can be used to examine your colon directly (sigmoidoscopy or colonoscopy). This is done to check for the earliest forms of colorectal cancer.  Routine screening usually begins at age 8.  Direct examination of the colon should be repeated every 5-10 years through 45 years of age. However, you may need to be screened more often if early forms of precancerous polyps or small growths are found. Skin Cancer  Check your skin from head to toe regularly.  Tell your health care provider about any new moles or changes in moles, especially if there is a change in a mole's shape or color.  Also tell your health care provider if you have a mole that is larger than the size of a pencil eraser.  Always use sunscreen. Apply sunscreen liberally and repeatedly throughout the day.  Protect yourself by wearing long sleeves, pants, a wide-brimmed hat, and sunglasses whenever you are outside. HEART DISEASE, DIABETES, AND HIGH  BLOOD PRESSURE   High blood pressure causes heart disease and increases the risk of stroke. High blood pressure is more likely to develop in:  People who have blood pressure in the high end of the normal range (130-139/85-89 mm Hg).  People who are overweight or obese.  People who are African American.  If you are 85-51 years of age, have your blood pressure checked every 3-5 years. If you are 92 years of age or older, have your blood pressure checked every year. You should have your blood pressure measured twice--once when you are at a hospital or clinic, and once when you are not at a hospital or clinic. Record the average of the two measurements. To check your blood pressure when you are not at a hospital or clinic, you can use:  An automated blood pressure machine at a pharmacy.  A home blood pressure monitor.  If you are between 16 years and 78 years old, ask your health care provider if you should take aspirin to prevent strokes.  Have regular diabetes screenings. This involves taking a blood sample to check your fasting blood sugar level.  If you are at a normal weight and have a low risk for diabetes, have this test once every three years after 45 years of age.  If you are overweight and have a high risk for diabetes, consider being tested at a younger age or more often. PREVENTING INFECTION  Hepatitis B  If you have a higher risk for hepatitis B, you should be screened for this virus. You are considered at high risk for hepatitis B if:  You were born in a country where hepatitis B is common. Ask your health care provider which countries are considered high risk.  Your parents were born in a high-risk country, and you have not been immunized against hepatitis B (hepatitis B vaccine).  You have HIV or AIDS.  You use needles to inject street drugs.  You live with someone who has hepatitis B.  You have had sex with someone who has hepatitis B.  You get hemodialysis  treatment.  You take certain medicines for conditions, including cancer, organ transplantation, and autoimmune conditions. Hepatitis C  Blood testing is recommended for:  Everyone born from [birth year] through 1966.  Anyone with known risk factors for hepatitis C. Sexually transmitted infections (STIs)  You should be screened for sexually transmitted infections (STIs) including gonorrhea and chlamydia if:  You are sexually active and are younger than 45 years of age.  You are older than 45 years of age and your health care provider tells you that you are at risk for this type of infection.  Your sexual activity has changed since you were last screened and you are at an increased risk for chlamydia or gonorrhea. Ask your health care provider if you are at risk.  If you do not have HIV, but are at risk, it may be recommended that you take a prescription medicine daily to prevent HIV infection. This is called pre-exposure prophylaxis (PrEP). You are considered at risk if:  You are sexually active and do not regularly use condoms or know the HIV status of your partner(s).  You take drugs by injection.  You are sexually active with a partner who has HIV. Talk with your health care provider about whether you are at high risk of being infected with HIV. If you choose to begin PrEP, you should first be tested for HIV. You should then be tested every 3 months for as long as you are taking PrEP.  PREGNANCY   If you are premenopausal and you may become pregnant, ask your health care provider about preconception counseling.  If you may become pregnant, take 400 to 800 micrograms (mcg) of folic acid every day.  If you  want to prevent pregnancy, talk to your health care provider about birth control (contraception). OSTEOPOROSIS AND MENOPAUSE   Osteoporosis is a disease in which the bones lose minerals and strength with aging. This can result in serious bone fractures. Your risk for osteoporosis can  be identified using a bone density scan.  If you are 28 years of age or older, or if you are at risk for osteoporosis and fractures, ask your health care provider if you should be screened.  Ask your health care provider whether you should take a calcium or vitamin D supplement to lower your risk for osteoporosis.  Menopause may have certain physical symptoms and risks.  Hormone replacement therapy may reduce some of these symptoms and risks. Talk to your health care provider about whether hormone replacement therapy is right for you.  HOME CARE INSTRUCTIONS   Schedule regular health, dental, and eye exams.  Stay current with your immunizations.   Do not use any tobacco products including cigarettes, chewing tobacco, or electronic cigarettes.  If you are pregnant, do not drink alcohol.  If you are breastfeeding, limit how much and how often you drink alcohol.  Limit alcohol intake to no more than 1 drink per day for nonpregnant women. One drink equals 12 ounces of beer, 5 ounces of wine, or 1 ounces of hard liquor.  Do not use street drugs.  Do not share needles.  Ask your health care provider for help if you need support or information about quitting drugs.  Tell your health care provider if you often feel depressed.  Tell your health care provider if you have ever been abused or do not feel safe at home.   This information is not intended to replace advice given to you by your health care provider. Make sure you discuss any questions you have with your health care provider.   Document Released: 10/09/2010 Document Revised: 04/16/2014 Document Reviewed: 02/25/2013 Elsevier Interactive Patient Education [employer]. Hysterectomy Information  A hysterectomy is a surgery in which your uterus is removed. This surgery may be done to treat various medical problems. After the surgery, you will no longer have menstrual periods. The surgery will also make you unable to become  pregnant (sterile). The fallopian tubes and ovaries can be removed (bilateral salpingo-oophorectomy) during this surgery as well.  REASONS FOR A HYSTERECTOMY  Persistent, abnormal bleeding.  Lasting (chronic) pelvic pain or infection.  The lining of the uterus (endometrium) starts growing outside the uterus (endometriosis).  The endometrium starts growing in the muscle of the uterus (adenomyosis).  The uterus falls down into the vagina (pelvic organ prolapse).  Noncancerous growths in the uterus (uterine fibroids) that cause symptoms.  Precancerous cells.  Cervical cancer or uterine cancer. TYPES OF HYSTERECTOMIES  Supracervical hysterectomy--In this type, the top part of the uterus is removed, but not the cervix.  Total hysterectomy--The uterus and cervix are removed.  Radical hysterectomy--The uterus, the cervix, and the fibrous tissue that holds the uterus in place in the pelvis (parametrium) are removed. WAYS A HYSTERECTOMY CAN BE PERFORMED  Abdominal hysterectomy--A large surgical cut (incision) is made in the abdomen. The uterus is removed through this incision.  Vaginal hysterectomy--An incision is made in the vagina. The uterus is removed through this incision. There are no abdominal incisions.  Conventional laparoscopic hysterectomy--Three or four small incisions are made in the abdomen. A thin, lighted tube with a camera (laparoscope) is inserted into one of the incisions. Other tools are put through the  other incisions. The uterus is cut into small pieces. The small pieces are removed through the incisions, or they are removed through the vagina.  Laparoscopically assisted vaginal hysterectomy (LAVH)--Three or four small incisions are made in the abdomen. Part of the surgery is performed laparoscopically and part vaginally. The uterus is removed through the vagina.  Robot-assisted laparoscopic hysterectomy--A laparoscope and other tools are inserted into 3 or 4 small  incisions in the abdomen. A computer-controlled device is used to give the surgeon a 3D image and to help control the surgical instruments. This allows for more precise movements of surgical instruments. The uterus is cut into small pieces and removed through the incisions or removed through the vagina. RISKS AND COMPLICATIONS  Possible complications associated with this procedure include:  Bleeding and risk of blood transfusion. Tell your health care provider if you do not want to receive any blood products.  Blood clots in the legs or lung.  Infection.  Injury to surrounding organs.  Problems or side effects related to anesthesia.  Conversion to an abdominal hysterectomy from one of the other techniques. WHAT TO EXPECT AFTER A HYSTERECTOMY  You will be given pain medicine.  You will need to have someone with you for the first 3-5 days after you go home.  You will need to follow up with your surgeon in 2-4 weeks after surgery to evaluate your progress.  You may have early menopause symptoms such as hot flashes, night sweats, and insomnia.  If you had a hysterectomy for a problem that was not cancer or not a condition that could lead to cancer, then you no longer need Pap tests. However, even if you no longer need a Pap test, a regular exam is a good idea to make sure no other problems are starting.   This information is not intended to replace advice given to you by your health care provider. Make sure you discuss any questions you have with your health care provider.   Document Released: 09/19/2000 Document Revised: 01/14/2013 Document Reviewed: 12/01/2012 Elsevier Interactive Patient Education [employer].

## 2015-02-22 NOTE — Progress Notes (Signed)
Karen [name] Oct 06, 1970 [id number]    History:    Presents for annual exam.  Continues to have menorrhagia. BTL, Mirena IUD 2010, last IUD fell out due to menorrhagia. Has been on Depo-Provera for the past year, continues to bleed 15-20 days each month with heavy flow/clots changing pads every 1-2 hours for the majority of the time. Megace helps to slow bleeding. Menorrhagia has increased over the past few years but has had heavy periods always. Normal TSH and bleeding profile. Has lost 24 pounds in the past year, morbid obesity. Normal Pap history. Hypertension stable on medication per primary care. Negative sonohysterogram with negative biopsy 2015.  Past medical history, past surgical history, family history and social history were all reviewed and documented in the EPIC chart. Finishing up an [employer]. Recently laid off from [employer]. Both children doing well. Mother diabetes, hypertension, father hypertension, heart disease and prostate cancer.  ROS:  A ROS was performed and pertinent positives and negatives are included.  Exam:  Filed Vitals:   02/22/15 1109  BP: 132/80    General appearance:  Normal Thyroid:  Symmetrical, normal in size, without palpable masses or nodularity. Respiratory  Auscultation:  Clear without wheezing or rhonchi Cardiovascular  Auscultation:  Regular rate, without rubs, murmurs or gallops  Edema/varicosities:  Not grossly evident Abdominal  Soft,nontender, without masses, guarding or rebound.  Liver/spleen:  No organomegaly noted  Hernia:  None appreciated  Skin  Inspection:  Grossly normal   Breasts: Examined lying and sitting.     Right: Without masses, retractions, discharge or axillary adenopathy.     Left: Without masses, retractions, discharge or axillary adenopathy. Gentitourinary   Inguinal/mons:  Normal without inguinal adenopathy  External genitalia:  Normal  BUS/Urethra/Skene's glands:  Normal  Vagina:  Normal  Cervix:  Normal  Uterus:    normal in size, shape and contour.  Midline and mobile limited exam-morbid obesity  Adnexa/parametria:     Rt: Without masses or tenderness.   Lt: Without masses or tenderness.  Anus and perineum: Normal  Digital rectal exam: Normal sphincter tone without palpated masses or tenderness  Assessment/Plan:  44 y.o. MBF G2 P2 for annual exam with continued menorrhagia.  Menorrhagia - BTL/Depo-Provera some relief with Megace Morbid obesity Hypertension-primary care manages labs and meds  Plan: Instructed to schedule surgical consult appointment with Dr. Phineas Real to discuss possible hysterectomy. Reviewed surgical risks related to obesity and infection, perforation, hemorrhage. Megace 40 mg twice daily when necessary #30. SBE's, schedule annual screening mammogram, reviewed importance of annual screen. Continue to increase exercise and decrease calories for continued weight loss. UA, Pap with HR HPV typing, new screening guidelines reviewed.  Huel Cote WHNP, 1:25 PM 02/22/2015

## 2015-02-24 LAB — CYTOLOGY - PAP

## 2015-03-14 ENCOUNTER — Encounter: Payer: Self-pay | Admitting: Gynecology

## 2015-03-14 ENCOUNTER — Ambulatory Visit (INDEPENDENT_AMBULATORY_CARE_PROVIDER_SITE_OTHER): Payer: 59 | Admitting: Gynecology

## 2015-03-14 VITALS — BP 130/84

## 2015-03-14 DIAGNOSIS — N920 Excessive and frequent menstruation with regular cycle: Secondary | ICD-10-CM | POA: Diagnosis not present

## 2015-03-14 LAB — CBC WITH DIFFERENTIAL/PLATELET
BASOS PCT: 0 % (ref 0–1)
Basophils Absolute: 0 10*3/uL (ref 0.0–0.1)
Eosinophils Absolute: 0.1 10*3/uL (ref 0.0–0.7)
Eosinophils Relative: 1 % (ref 0–5)
HEMATOCRIT: 40.8 % (ref 36.0–46.0)
HEMOGLOBIN: 13.4 g/dL (ref 12.0–15.0)
LYMPHS PCT: 24 % (ref 12–46)
Lymphs Abs: 2.3 10*3/uL (ref 0.7–4.0)
MCH: 27.7 pg (ref 26.0–34.0)
MCHC: 32.8 g/dL (ref 30.0–36.0)
MCV: 84.5 fL (ref 78.0–100.0)
MPV: 11.1 fL (ref 8.6–12.4)
Monocytes Absolute: 0.8 10*3/uL (ref 0.1–1.0)
Monocytes Relative: 9 % (ref 3–12)
NEUTROS PCT: 66 % (ref 43–77)
Neutro Abs: 6.2 10*3/uL (ref 1.7–7.7)
Platelets: 334 10*3/uL (ref 150–400)
RBC: 4.83 MIL/uL (ref 3.87–5.11)
RDW: 14.1 % (ref 11.5–15.5)
WBC: 9.4 10*3/uL (ref 4.0–10.5)

## 2015-03-14 NOTE — Progress Notes (Signed)
Karen [name] [birth year] [id number]        44 y.o.  G2P2 Presents to discuss options in reference to her menorrhagia. Long history of heavy menses. Status post tubal sterilization. Trial of IUD 2 with spontaneous expulsion twice. Gastric sleeve placed one year ago with reporting over 100 pound weight loss. Currently on Megace intermittently to control her heavy bleeding. Sonohysterogram one year ago shows 2 small myomas with empty cavity and negative endometrial biopsy.  Past medical history,surgical history, problem list, medications, allergies, family history and social history were all reviewed and documented in the EPIC chart.  Directed ROS with pertinent positives and negatives documented in the history of present illness/assessment and plan.  Exam: Kim assistant Filed Vitals:   03/14/15 0816  BP: 130/84   General appearance:  Normal Abdomen obese without gross masses or tenderness Pelvic external BUS vagina normal. Cervix normal. Uterus grossly normal but difficult to palpate. Adnexa without gross masses or tenderness  Assessment/Plan:  44 y.o. G2P2 with menorrhagia. Check baseline CBC today. Options for management reviewed to include hormonal manipulation, attempt at Mirena IUD again, endometrial ablation, hysterectomy. The pros/cons, risks/benefits of each approach discussed. Given her weight I reviewed with her increased surgical risks with hysterectomy and possible consideration for robotic assistance if needed. Benefits of ablation as far as decreased surgical risks reviewed but also possible failure risk with the ablation.patient understands that a permanent procedure and should never consider pregnancy following this noting she did have a tubal sterilization. Patient wants to proceed with ablation and will go ahead make arrangements for this.    Anastasio Auerbach MD, 8:54 AM 03/14/2015

## 2015-03-14 NOTE — Patient Instructions (Signed)
Office will call you to arrange surgery. 

## 2015-03-15 ENCOUNTER — Telehealth: Payer: Self-pay

## 2015-03-15 MED ORDER — MISOPROSTOL 200 MCG PO TABS: ORAL_TABLET | ORAL | Status: DC

## 2015-03-15 NOTE — Telephone Encounter (Signed)
Patient called back. In the course of our conversation I realized that Dr. Dorette Grate order is for Novasure Ablation to be scheduled at [facility]. I apologized to patient for my confusion.  Patient is scheduled for 03/29/15 at [facility].  Explained need for Cytotect tab vaginally hs before surgery and Rx was sent. She was advised to stay on Megace until surgery.  Dr. Loetta Rough- Patient really does not want to come for consult and did not see the need in it. She asked me to check with you to see if really necessary. She said you talked with her the other day.

## 2015-03-15 NOTE — Telephone Encounter (Signed)
She needs to come in because:  #1 I did not do a full physical exam for the OR #2 I did not go through the full counseling as far as risks #3 I need to sound her uterus for uterine depth to make sure the NovaSure is appropriate to fit as we have not done this as of yet.

## 2015-03-15 NOTE — Telephone Encounter (Signed)
I left message to call me.

## 2015-03-15 NOTE — Telephone Encounter (Signed)
Yes, have her stay on the Megace through the surgery.

## 2015-03-15 NOTE — Telephone Encounter (Signed)
Left message to call.

## 2015-03-15 NOTE — Telephone Encounter (Signed)
Dr. Loetta Rough- I called patient to schedule Karen [name] Option Ablation per your order. Usually, I schedule it in coordination with their menses and have them start Prometrium on Day 3. However, she informed me she has been taking Megace per [place] for awhile and is not having any bleeding. Is it okay to just schedule Karen [name] whenever I can and I would assume forego the Promerium?

## 2015-03-16 NOTE — Telephone Encounter (Signed)
Patient informed. 

## 2015-03-20 ENCOUNTER — Other Ambulatory Visit: Payer: Self-pay | Admitting: Gynecology

## 2015-03-21 ENCOUNTER — Encounter: Payer: Self-pay | Admitting: Gynecology

## 2015-03-21 ENCOUNTER — Encounter (HOSPITAL_COMMUNITY)
Admission: RE | Admit: 2015-03-21 | Discharge: 2015-03-21 | Disposition: A | Discharge status: Home / Self Care | Payer: 59 | Source: Ambulatory Visit | Attending: Gynecology | Admitting: Gynecology

## 2015-03-21 ENCOUNTER — Ambulatory Visit (INDEPENDENT_AMBULATORY_CARE_PROVIDER_SITE_OTHER): Payer: 59 | Admitting: Gynecology

## 2015-03-21 ENCOUNTER — Encounter (HOSPITAL_COMMUNITY): Payer: Self-pay

## 2015-03-21 ENCOUNTER — Other Ambulatory Visit: Payer: Self-pay

## 2015-03-21 VITALS — BP 134/82

## 2015-03-21 DIAGNOSIS — Z01818 Encounter for other preprocedural examination: Secondary | ICD-10-CM | POA: Insufficient documentation

## 2015-03-21 DIAGNOSIS — N920 Excessive and frequent menstruation with regular cycle: Secondary | ICD-10-CM

## 2015-03-21 HISTORY — DX: Essential (primary) hypertension: I10

## 2015-03-21 HISTORY — DX: Unspecified osteoarthritis, unspecified site: M19.90

## 2015-03-21 LAB — CBC
HCT: 40.4 % (ref 36.0–46.0)
Hemoglobin: 13.1 g/dL (ref 12.0–15.0)
MCH: 27.9 pg (ref 26.0–34.0)
MCHC: 32.4 g/dL (ref 30.0–36.0)
MCV: 86 fL (ref 78.0–100.0)
PLATELETS: 298 10*3/uL (ref 150–400)
RBC: 4.7 MIL/uL (ref 3.87–5.11)
RDW: 14.2 % (ref 11.5–15.5)
WBC: 9.3 10*3/uL (ref 4.0–10.5)

## 2015-03-21 LAB — COMPREHENSIVE METABOLIC PANEL
ALT: 14 U/L (ref 14–54)
ANION GAP: 8 (ref 5–15)
AST: 17 U/L (ref 15–41)
Albumin: 4.1 g/dL (ref 3.5–5.0)
Alkaline Phosphatase: 68 U/L (ref 38–126)
BILIRUBIN TOTAL: 0.6 mg/dL (ref 0.3–1.2)
BUN: 11 mg/dL (ref 6–20)
CHLORIDE: 104 mmol/L (ref 101–111)
CO2: 26 mmol/L (ref 22–32)
Calcium: 9.3 mg/dL (ref 8.9–10.3)
Creatinine, Ser: 0.7 mg/dL (ref 0.44–1.00)
Glucose, Bld: 95 mg/dL (ref 65–99)
POTASSIUM: 3.6 mmol/L (ref 3.5–5.1)
Sodium: 138 mmol/L (ref 135–145)
TOTAL PROTEIN: 8.1 g/dL (ref 6.5–8.1)

## 2015-03-21 NOTE — Pre-Procedure Instructions (Signed)
Dr. Gifford Shave made aware of EKG results no new orders received at this time

## 2015-03-21 NOTE — Progress Notes (Signed)
Musette SHAKEYIA KATKO July 23, 1970 [id number]   History and Physical  Chief complaint: Menorrhagia  History of present illness: 44 y.o. G2P2 with long history of menorrhagia getting worse with frequent pad changes bleedthrough episodes. Sonohysterogram with endometrial biopsy last year negative excepting 2 small intramural myomas.  Had tried Mirena IUD 2 with both spontaneously extruding. Had also tried Depo-Provera with continued heavy bleeding. Now requiring Megace intermittently to control her bleeding.  Normal TSH. Status post BTL. Options for management reviewed to include hysterectomy, continued attempts at hormonal manipulation, endometrial ablation. After reviewing the risks/benefits of each approach the patient elects for an endometrial ablation and is admitted for NovaSure endometrial ablation.  Endometrial sounding and biopsy was performed 03/21/2015 as described below below as it has been a little over one year since her sonohysterogram and prior biopsy.  Past medical history,surgical history, medications, allergies, family history and social history were all reviewed and documented in the EPIC chart.  ROS:  Was performed and pertinent positives and negatives are included in the history of present illness.  Exam:  Kim assistant General: well developed, well nourished female, no acute distress HEENT: normal  Lungs: clear to auscultation without wheezing, rales or rhonchi  Cardiac: regular rate without rubs, murmurs or gallops  Abdomen: soft, obese without gross masses or tenderness Pelvic: external bus vagina: normal   Cervix: grossly normal  Uterus: difficult to palpate but no gross masses or tenderness  Adnexa: without gross masses or tenderness   Procedure: The cervix was cleansed with Betadine and a Pipelle was easily placed within the endometrial cavity, uterus sounding 9 cm with adequate to scant endometrial return. Patient tolerated well   Assessment/Plan:  44 y.o. G2P2 with  worsening menorrhagia despite failed Mirena IUD 2 and failed hormonal manipulation. Options for management reviewed and the patient elects for NovaSure endometrial ablation. Patient did have a repeat sampling of the endometrium, the results of which will return before her surgery. I did not feel repeat ultrasound was necessary given a normal sonohysterogram little over one year ago for the same symptoms.  I reviewed the proposed surgery to include the intraoperative, postoperative and recovery courses. The risks of the surgery were reviewed to include uterine perforation and damage to internal organs including bowel, bladder, ureters, vessels, nerves either through direct injury or thermal damage from the NovaSure. The possible need for emergency reparative surgeries to include bowel resection, ostomy formation, bladder/ureteral/vascular damage repair.  Possible damage to vagina and surrounding tissues to include direct injury versus thermal injury. The risks of hemorrhage during or following the procedure requiring transfusion and the risks of transfusion. Infection either immediate or delayed requiring prolonged antibiotics was also reviewed. I discussed there are no guarantees with this procedure and that her periods may continue heavy or worsen or change and she may require future treatments or surgery. She is status post BTL, age 71, but I also reviewed she should never pursue pregnancy following this procedure and she understands and accepts this.    Anastasio Auerbach MD, 10:54 AM 03/21/2015

## 2015-03-21 NOTE — H&P (Signed)
  Karen [name] Aug 09, 1970 [id number]   History and Physical  Chief complaint: Menorrhagia  History of present illness: 44 y.o. G2P2 with long history of menorrhagia getting worse with frequent pad changes bleedthrough episodes. Sonohysterogram with endometrial biopsy last year negative excepting 2 small intramural myomas.  Had tried Mirena IUD 2 with both spontaneously extruding. Had also tried Depo-Provera with continued heavy bleeding. Now requiring Megace intermittently to control her bleeding.  Normal TSH. Status post BTL. Options for management reviewed to include hysterectomy, continued attempts at hormonal manipulation, endometrial ablation. After reviewing the risks/benefits of each approach the patient elects for an endometrial ablation and is admitted for NovaSure endometrial ablation.  Endometrial sounding and biopsy was performed 03/21/2015 as described below below as it has been a little over one year since her sonohysterogram and prior biopsy.  Past medical history,surgical history, medications, allergies, family history and social history were all reviewed and documented in the EPIC chart.  ROS:  Was performed and pertinent positives and negatives are included in the history of present illness.  Exam:  Kim assistant General: well developed, well nourished female, no acute distress HEENT: normal  Lungs: clear to auscultation without wheezing, rales or rhonchi  Cardiac: regular rate without rubs, murmurs or gallops  Abdomen: soft, obese without gross masses or tenderness Pelvic: external bus vagina: normal   Cervix: grossly normal  Uterus: difficult to palpate but no gross masses or tenderness  Adnexa: without gross masses or tenderness   Procedure: The cervix was cleansed with Betadine and a Pipelle was easily placed within the endometrial cavity, uterus sounding 9 cm with adequate to scant endometrial return. Patient tolerated well   Assessment/Plan:  44 y.o. G2P2 with  worsening menorrhagia despite failed Mirena IUD 2 and failed hormonal manipulation. Options for management reviewed and the patient elects for NovaSure endometrial ablation. Patient did have a repeat sampling of the endometrium, the results of which will return before her surgery. I did not feel repeat ultrasound was necessary given a normal sonohysterogram little over one year ago for the same symptoms.  I reviewed the proposed surgery to include the intraoperative, postoperative and recovery courses. The risks of the surgery were reviewed to include uterine perforation and damage to internal organs including bowel, bladder, ureters, vessels, nerves either through direct injury or thermal damage from the NovaSure. The possible need for emergency reparative surgeries to include bowel resection, ostomy formation, bladder/ureteral/vascular damage repair.  Possible damage to vagina and surrounding tissues to include direct injury versus thermal injury. The risks of hemorrhage during or following the procedure requiring transfusion and the risks of transfusion. Infection either immediate or delayed requiring prolonged antibiotics was also reviewed. I discussed there are no guarantees with this procedure and that her periods may continue heavy or worsen or change and she may require future treatments or surgery. She is status post BTL, age 44, but I also reviewed she should never pursue pregnancy following this procedure and she understands and accepts this.    Anastasio Auerbach MD, 11:03 AM 03/21/2015

## 2015-03-21 NOTE — Patient Instructions (Signed)
Follow up for surgery as scheduled. Office will call you with the biopsy results

## 2015-03-21 NOTE — Patient Instructions (Signed)
Your procedure is scheduled on:  Tuesday, Dec. 20, 2016  Enter through the [employer] [facility] at:  9:00 A.M.  Pick up the phone at the desk and dial [phone].  Call this number if you have problems the morning of surgery: [phone].  Remember: Do NOT eat food or drink after:  Midnight Monday, Dec. 19, 2016 Take these medicines the morning of surgery with a SIP OF WATER:  Lisinopril  *Stop taking Fish Oil at this time  Do NOT wear jewelry (body piercing), metal hair clips/bobby pins, make-up, or nail polish. Do NOT wear lotions, powders, or perfumes.  You may wear deoderant. Do NOT shave for 48 hours prior to surgery. Do NOT bring valuables to the hospital. Contacts, dentures, or bridgework may not be worn into surgery. Leave suitcase in car.  After surgery it may be brought to your room.  For patients admitted to the hospital, checkout time is 11:00 AM the day of discharge.

## 2015-03-22 ENCOUNTER — Telehealth: Payer: Self-pay

## 2015-03-22 NOTE — Telephone Encounter (Signed)
When I called [employer] to check benefits the other day that the representative mentioned that patient is paid up through 03/09/15 and is currently in a grace period.  I called patient today and left a message letting her know they told me this and asking her to be sure she stays current with her premiums as her ins is paying 100% currently.  I told her if she has paid it she should follow up with them. I did advise her Dr. Dorette Grate estimated charge [phone] and she would be responsible for that if her insurance were to lapse.

## 2015-03-23 ENCOUNTER — Telehealth: Payer: Self-pay

## 2015-03-23 DIAGNOSIS — N938 Other specified abnormal uterine and vaginal bleeding: Secondary | ICD-10-CM

## 2015-03-23 MED ORDER — MEGESTROL ACETATE 40 MG PO TABS: 40.0000 mg | ORAL_TABLET | Freq: Two times a day (BID) | ORAL | Status: DC

## 2015-03-23 NOTE — Telephone Encounter (Signed)
Patient said Elon Alas, NP had prescribed Megace for her and she had been taking until she ran out of it yesterday.  She is bleeding today. She wanted me to ask if you wanted her to continue on it and if so could you send Rx for her.

## 2015-03-23 NOTE — Telephone Encounter (Signed)
Patient informed. Rx sent 

## 2015-03-23 NOTE — Telephone Encounter (Signed)
Yes refill it and she should continue this through her surgery

## 2015-03-25 ENCOUNTER — Institutional Professional Consult (permissible substitution): Payer: 59 | Admitting: Gynecology

## 2015-03-28 MED ORDER — DEXTROSE 5 % IV SOLN
2.0000 g | INTRAVENOUS | Status: AC
  Administered 2015-03-29: 2 g via INTRAVENOUS
  Filled 2015-03-28: qty 2

## 2015-03-29 ENCOUNTER — Ambulatory Visit (HOSPITAL_COMMUNITY): Payer: 59 | Admitting: Anesthesiology

## 2015-03-29 ENCOUNTER — Ambulatory Visit (HOSPITAL_COMMUNITY)
Admission: RE | Admit: 2015-03-29 | Discharge: 2015-03-29 | Disposition: A | Discharge status: Home / Self Care | Payer: 59 | Source: Ambulatory Visit | Attending: Gynecology | Admitting: Gynecology

## 2015-03-29 ENCOUNTER — Encounter (HOSPITAL_COMMUNITY): Payer: Self-pay | Admitting: Anesthesiology

## 2015-03-29 ENCOUNTER — Encounter (HOSPITAL_COMMUNITY)
Admission: RE | Disposition: A | Discharge status: Home / Self Care | Payer: Self-pay | Source: Ambulatory Visit | Attending: Gynecology

## 2015-03-29 DIAGNOSIS — N920 Excessive and frequent menstruation with regular cycle: Secondary | ICD-10-CM | POA: Insufficient documentation

## 2015-03-29 DIAGNOSIS — Z792 Long term (current) use of antibiotics: Secondary | ICD-10-CM | POA: Diagnosis not present

## 2015-03-29 DIAGNOSIS — Z6841 Body Mass Index (BMI) 40.0 and over, adult: Secondary | ICD-10-CM | POA: Insufficient documentation

## 2015-03-29 DIAGNOSIS — I10 Essential (primary) hypertension: Secondary | ICD-10-CM | POA: Diagnosis not present

## 2015-03-29 DIAGNOSIS — Z9851 Tubal ligation status: Secondary | ICD-10-CM | POA: Diagnosis not present

## 2015-03-29 HISTORY — PX: HYSTEROSCOPY WITH NOVASURE: SHX5574

## 2015-03-29 LAB — PREGNANCY, URINE: Preg Test, Ur: NEGATIVE

## 2015-03-29 SURGERY — HYSTEROSCOPY WITH NOVASURE
Anesthesia: General | Site: Vagina

## 2015-03-29 MED ORDER — HYDROCODONE-ACETAMINOPHEN 7.5-325 MG PO TABS
1.0000 | ORAL_TABLET | Freq: Once | ORAL | Status: AC | PRN
  Administered 2015-03-29: 1 via ORAL

## 2015-03-29 MED ORDER — ACETAMINOPHEN 160 MG/5ML PO SOLN
975.0000 mg | Freq: Once | ORAL | Status: AC
  Administered 2015-03-29: 975 mg via ORAL

## 2015-03-29 MED ORDER — LIDOCAINE HCL (CARDIAC) 20 MG/ML IV SOLN
INTRAVENOUS | Status: DC | PRN
  Administered 2015-03-29: 80 mg via INTRAVENOUS

## 2015-03-29 MED ORDER — KETOROLAC TROMETHAMINE 30 MG/ML IJ SOLN
INTRAMUSCULAR | Status: AC
  Filled 2015-03-29: qty 1

## 2015-03-29 MED ORDER — LACTATED RINGERS IV SOLN
INTRAVENOUS | Status: DC
  Administered 2015-03-29: 09:00:00 via INTRAVENOUS

## 2015-03-29 MED ORDER — MIDAZOLAM HCL 2 MG/2ML IJ SOLN
INTRAMUSCULAR | Status: DC | PRN
  Administered 2015-03-29: 2 mg via INTRAVENOUS

## 2015-03-29 MED ORDER — LIDOCAINE HCL (CARDIAC) 20 MG/ML IV SOLN
INTRAVENOUS | Status: AC
  Filled 2015-03-29: qty 5

## 2015-03-29 MED ORDER — ONDANSETRON HCL 4 MG/2ML IJ SOLN
INTRAMUSCULAR | Status: AC
  Filled 2015-03-29: qty 2

## 2015-03-29 MED ORDER — MIDAZOLAM HCL 2 MG/2ML IJ SOLN
INTRAMUSCULAR | Status: AC
  Filled 2015-03-29: qty 2

## 2015-03-29 MED ORDER — HYDROCODONE-ACETAMINOPHEN 7.5-325 MG PO TABS
ORAL_TABLET | ORAL | Status: AC
  Administered 2015-03-29: 1 via ORAL
  Filled 2015-03-29: qty 1

## 2015-03-29 MED ORDER — PROPOFOL 10 MG/ML IV BOLUS
INTRAVENOUS | Status: AC
  Filled 2015-03-29: qty 20

## 2015-03-29 MED ORDER — DEXAMETHASONE SODIUM PHOSPHATE 10 MG/ML IJ SOLN
INTRAMUSCULAR | Status: AC
  Filled 2015-03-29: qty 1

## 2015-03-29 MED ORDER — LIDOCAINE HCL 1 % IJ SOLN
INTRAMUSCULAR | Status: AC
  Filled 2015-03-29: qty 20

## 2015-03-29 MED ORDER — MORPHINE SULFATE (PF) 4 MG/ML IV SOLN
INTRAVENOUS | Status: AC
  Administered 2015-03-29: 4 mg via INTRAVENOUS
  Filled 2015-03-29: qty 1

## 2015-03-29 MED ORDER — OXYCODONE-ACETAMINOPHEN 5-325 MG PO TABS
1.0000 | ORAL_TABLET | Freq: Four times a day (QID) | ORAL | Status: DC | PRN
Start: 2015-03-29 — End: 2015-04-13

## 2015-03-29 MED ORDER — FENTANYL CITRATE (PF) 100 MCG/2ML IJ SOLN
25.0000 ug | INTRAMUSCULAR | Status: DC | PRN
  Administered 2015-03-29 (×2): 50 ug via INTRAVENOUS

## 2015-03-29 MED ORDER — SCOPOLAMINE 1 MG/3DAYS TD PT72
MEDICATED_PATCH | TRANSDERMAL | Status: AC
  Administered 2015-03-29: 1.5 mg via TRANSDERMAL
  Filled 2015-03-29: qty 1

## 2015-03-29 MED ORDER — MEPERIDINE HCL 25 MG/ML IJ SOLN: 6.2500 mg | INTRAMUSCULAR | Status: DC | PRN

## 2015-03-29 MED ORDER — SCOPOLAMINE 1 MG/3DAYS TD PT72
1.0000 | MEDICATED_PATCH | Freq: Once | TRANSDERMAL | Status: DC
  Administered 2015-03-29: 1.5 mg via TRANSDERMAL

## 2015-03-29 MED ORDER — LACTATED RINGERS IV SOLN
INTRAVENOUS | Status: DC | PRN
  Administered 2015-03-29 (×2): via INTRAVENOUS

## 2015-03-29 MED ORDER — MORPHINE SULFATE (PF) 4 MG/ML IV SOLN
4.0000 mg | Freq: Once | INTRAVENOUS | Status: AC
  Administered 2015-03-29: 4 mg via INTRAVENOUS

## 2015-03-29 MED ORDER — METOCLOPRAMIDE HCL 5 MG/ML IJ SOLN
INTRAMUSCULAR | Status: AC
  Administered 2015-03-29: 10 mg via INTRAVENOUS
  Filled 2015-03-29: qty 2

## 2015-03-29 MED ORDER — ACETAMINOPHEN 160 MG/5ML PO SOLN
ORAL | Status: AC
  Administered 2015-03-29: 975 mg via ORAL
  Filled 2015-03-29: qty 40.6

## 2015-03-29 MED ORDER — DEXAMETHASONE SODIUM PHOSPHATE 10 MG/ML IJ SOLN
INTRAMUSCULAR | Status: DC | PRN
  Administered 2015-03-29: 5 mg via INTRAVENOUS

## 2015-03-29 MED ORDER — FENTANYL CITRATE (PF) 100 MCG/2ML IJ SOLN
INTRAMUSCULAR | Status: DC | PRN
Start: 2015-03-29 — End: 2015-03-29
  Administered 2015-03-29 (×2): 50 ug via INTRAVENOUS

## 2015-03-29 MED ORDER — METOCLOPRAMIDE HCL 5 MG/ML IJ SOLN
10.0000 mg | Freq: Once | INTRAMUSCULAR | Status: AC | PRN
  Administered 2015-03-29: 10 mg via INTRAVENOUS

## 2015-03-29 MED ORDER — FENTANYL CITRATE (PF) 100 MCG/2ML IJ SOLN
INTRAMUSCULAR | Status: AC
  Administered 2015-03-29: 50 ug via INTRAVENOUS
  Filled 2015-03-29: qty 2

## 2015-03-29 MED ORDER — ONDANSETRON HCL 4 MG/2ML IJ SOLN
INTRAMUSCULAR | Status: DC | PRN
  Administered 2015-03-29: 4 mg via INTRAVENOUS

## 2015-03-29 MED ORDER — LIDOCAINE HCL 1 % IJ SOLN
INTRAMUSCULAR | Status: DC | PRN
  Administered 2015-03-29: 10 mL

## 2015-03-29 MED ORDER — KETOROLAC TROMETHAMINE 30 MG/ML IJ SOLN
INTRAMUSCULAR | Status: DC | PRN
  Administered 2015-03-29: 30 mg via INTRAVENOUS

## 2015-03-29 MED ORDER — PROPOFOL 10 MG/ML IV BOLUS
INTRAVENOUS | Status: DC | PRN
  Administered 2015-03-29: 300 mg via INTRAVENOUS

## 2015-03-29 MED ORDER — LACTATED RINGERS IR SOLN
Status: DC | PRN
  Administered 2015-03-29 (×2): 3000 mL

## 2015-03-29 MED ORDER — FENTANYL CITRATE (PF) 100 MCG/2ML IJ SOLN
INTRAMUSCULAR | Status: AC
  Filled 2015-03-29: qty 2

## 2015-03-29 SURGICAL SUPPLY — 16 items
ABLATOR ENDOMETRIAL BIPOLAR (ABLATOR) ×2 IMPLANT
CATH ROBINSON RED A/P 16FR (CATHETERS) ×2 IMPLANT
CLOTH BEACON ORANGE TIMEOUT ST (SAFETY) ×2 IMPLANT
CONTAINER PREFILL 10% NBF 60ML (FORM) ×4 IMPLANT
GLOVE BIO SURGEON STRL SZ7.5 (GLOVE) ×2 IMPLANT
GLOVE BIOGEL PI IND STRL 7.0 (GLOVE) ×1 IMPLANT
GLOVE BIOGEL PI IND STRL 8 (GLOVE) ×1 IMPLANT
GLOVE BIOGEL PI INDICATOR 7.0 (GLOVE) ×1
GLOVE BIOGEL PI INDICATOR 8 (GLOVE) ×1
GOWN STRL REUS W/TWL LRG LVL3 (GOWN DISPOSABLE) ×4 IMPLANT
PACK VAGINAL MINOR WOMEN LF (CUSTOM PROCEDURE TRAY) ×2 IMPLANT
PAD OB MATERNITY 4.3X12.25 (PERSONAL CARE ITEMS) ×2 IMPLANT
TOWEL OR 17X24 6PK STRL BLUE (TOWEL DISPOSABLE) ×4 IMPLANT
TUBING AQUILEX INFLOW (TUBING) ×2 IMPLANT
TUBING AQUILEX OUTFLOW (TUBING) ×2 IMPLANT
WATER STERILE IRR 1000ML POUR (IV SOLUTION) ×2 IMPLANT

## 2015-03-29 NOTE — H&P (Signed)
  The patient was examined.  I reviewed the proposed surgery and consent form with the patient.  The dictated history and physical is current and accurate and all questions were answered. The patient is ready to proceed with surgery and has a realistic understanding and expectation for the outcome.   Anastasio Auerbach MD, 9:46 AM 03/29/2015

## 2015-03-29 NOTE — Anesthesia Postprocedure Evaluation (Signed)
Anesthesia Post Note  Patient: Karen [name]  Procedure(s) Performed: Procedure(s) (LRB): HYSTEROSCOPY WITH NOVASURE (N/A)  Patient location during evaluation: PACU Anesthesia Type: General Level of consciousness: awake and alert and oriented Pain management: satisfactory to patient Vital Signs Assessment: post-procedure vital signs reviewed and stable Respiratory status: spontaneous breathing, nonlabored ventilation and respiratory function stable Cardiovascular status: blood pressure returned to baseline and stable Postop Assessment: no signs of nausea or vomiting Anesthetic complications: no    Last Vitals:  Filed Vitals:   03/29/15 1200 03/29/15 1215  BP: 103/60 123/87  Pulse: 84 76  Temp:    Resp: 16 16    Last Pain:  Filed Vitals:   03/29/15 1218  PainSc: 8                  Tallyn Holroyd A.

## 2015-03-29 NOTE — Op Note (Signed)
Karen [name] [birth year] [id number]   Post Operative Note   Date of surgery:  03/29/2015  Pre Op Dx:  Menorrhagia  Post Op Dx:  Menorrhagia  Procedure:  Hysteroscopy, NovaSure endometrial ablation  Surgeon:  Donalynn Furlong P  Anesthesia:  General  Estimated blood loss: Minimal  Distended media discrepancy:  90 cc reported noting moderate spillage on the floor  Complications:  None  Specimen:  none to pathology noting patient had recent outpatient endometrial biopsy which was benign  Findings: EUA:  External BUS vagina normal. Cervix normal. Uterus/adnexa unable to palpate but no gross masses.   Hysteroscopy:  Adequate noting fundus, anterior/posterior endometrial surfaces, lower uterine segment, endocervical canal, right/left tubal ostia all visualized and normal. Endometrium atrophic in appearance   NovaSure measurements: Uterine sound 10 cm. Cervical length 4 centimeters. Cavity length 6 cm. Cavity width 4.6 cm. Power 152. Time 75 seconds  Procedure:  The patient was taken to the operating room, was placed in the low dorsal lithotomy position, underwent general anesthesia, received a perineal/vaginal preparation with Betadine solution per nursing personnel and the bladder was emptied with in and out Foley catheterization. The timeout was performed by the surgical team. An EUA was performed. The patient was draped in the usual fashion. The cervix was visualized with a speculum, anterior lip grasped with a single-tooth tenaculum and a paracervical block was placed using 10 cc's of 1% lidocaine. The uterus was sounded and the cervical length obtained as noted above.The cervix did not need additional dilatation and diagnostic hysteroscopy was performed with normal findings noted above. No specimen was retrieved noting that the patient had a recent benign endometrial biopsy approximately one week prior to the procedure.The NovaSure ablator was then placed without difficulty and the  wand extended and manipulation as recommended by the manufacturer was performed to assure proper placement.  The carbon dioxide integrity test was performed and passed.  The NovaSure ablation was then performed without difficulty.  The instruments were removed and adequate hemostasis was visualized at the tenaculum site and external cervical os. The patient was given intraoperative Toradol, was awakened without difficulty and was taken to the recovery room in good condition having tolerated the procedure well.      Anastasio Auerbach MD, 10:46 AM 03/29/2015

## 2015-03-29 NOTE — Anesthesia Procedure Notes (Signed)
Procedure Name: LMA Insertion Date/Time: 03/29/2015 10:05 AM Performed by: Raenette Rover Pre-anesthesia Checklist: Patient identified, Patient being monitored, Emergency Drugs available and Suction available Patient Re-evaluated:Patient Re-evaluated prior to inductionOxygen Delivery Method: Circle system utilized Preoxygenation: Pre-oxygenation with 100% oxygen Intubation Type: IV induction LMA: LMA with gastric port inserted LMA Size: 4.0 Number of attempts: 1 Airway Equipment and Method: Patient positioned with wedge pillow Placement Confirmation: positive ETCO2,  CO2 detector and breath sounds checked- equal and bilateral Tube secured with: Tape Dental Injury: Teeth and Oropharynx as per pre-operative assessment

## 2015-03-29 NOTE — Discharge Instructions (Signed)
Postoperative Instructions Hysteroscopy D & C  Dr. Phineas Real and the nursing staff have discussed postoperative instructions with you.  If you have any questions please ask them before you leave the hospital, or call Dr Elisabeth Most office at [phone].    We would like to emphasize the following instructions:   ? Call the office to make your follow-up appointment as recommended by Dr Phineas Real (usually 1-2 weeks).  ? You were given a prescription, or one was ordered for you at the pharmacy you designated.  Get that prescription filled and take the medication according to instructions.  ? You may eat a regular diet, but slowly until you start having bowel movements.  ? Drink plenty of water daily.  ? Nothing in the vagina (intercourse, douching, objects of any kind) for two weeks.  When reinitiating intercourse, if it is uncomfortable, stop and make an appointment with Dr Phineas Real to be evaluated.  ? No driving for one to two days until the effects of anesthesia has worn off.  No traveling out of town for several days.  ? You may shower, but no baths for one week.  Walking up and down stairs is ok.  No heavy lifting, prolonged standing, repeated bending or any working out until your post op check.  ? Rest frequently, listen to your body and do not push yourself and overdo it.  ? Call if:  o Your pain medication does not seem strong enough. o Worsening pain or abdominal bloating o Persistent nausea or vomiting o Difficulty with urination or bowel movements. o Temperature of 101 degrees or higher. o Heavy vaginal bleeding.  If your period is due, you may use tampons. o You have any questions or concerns   DISCHARGE INSTRUCTIONS: HYSTEROSCOPY / ENDOMETRIAL ABLATION The following instructions have been prepared to help you care for yourself upon your return home.  May Remove Scop patch on or before  May take Ibuprofen after  May take stool softner while taking narcotic pain  medication to prevent constipation.  Drink plenty of water.  Personal hygiene:  Use sanitary pads for vaginal drainage, not tampons.  Shower the day after your procedure.  NO tub baths, pools or Jacuzzis for 2-3 weeks.  Wipe front to back after using the bathroom.  Activity and limitations:  Do NOT drive or operate any equipment for 24 hours. The effects of anesthesia are still present and drowsiness may result.  Do NOT rest in bed all day.  Walking is encouraged.  Walk up and down stairs slowly.  You may resume your normal activity in one to two days or as indicated by your physician. Sexual activity: NO intercourse for at least 2 weeks after the procedure, or as indicated by your Doctor.  Diet: Eat a light meal as desired this evening. You may resume your usual diet tomorrow.  Return to Work: You may resume your work activities in one to two days or as indicated by Marine scientist.  What to expect after your surgery: Expect to have vaginal bleeding/discharge for 2-3 days and spotting for up to 10 days. It is not unusual to have soreness for up to 1-2 weeks. You may have a slight burning sensation when you urinate for the first day. Mild cramps may continue for a couple of days. You may have a regular period in 2-6 weeks.  Call your doctor for any of the following:  Excessive vaginal bleeding or clotting, saturating and changing one pad every hour.  Inability to  urinate 6 hours after discharge from hospital.  Pain not relieved by pain medication.  Fever of 100.4 F or greater.  Unusual vaginal discharge or odor.  Return to office _________________Call for an appointment ___________________ Patients signature: ______________________ Nurses signature ________________________  [facility] [phone]

## 2015-03-29 NOTE — Transfer of Care (Signed)
Immediate Anesthesia Transfer of Care Note  Patient: Karen [name]  Procedure(s) Performed: Procedure(s) with comments: HYSTEROSCOPY WITH NOVASURE (N/A) - to follow first case around 10:15am.  Requests one hour OR time.  Patient Location: PACU  Anesthesia Type:General  Level of Consciousness: awake, alert , oriented and patient cooperative  Airway & Oxygen Therapy: Patient Spontanous Breathing and Patient connected to nasal cannula oxygen  Post-op Assessment: Report given to RN and Post -op Vital signs reviewed and stable  Post vital signs: Reviewed and stable  Last Vitals:  Filed Vitals:   03/29/15 0900  BP: 127/91  Pulse: 100  Temp: 36.8 C  Resp: 20    Complications: No apparent anesthesia complications

## 2015-03-29 NOTE — Anesthesia Preprocedure Evaluation (Addendum)
Anesthesia Evaluation  Patient identified by MRN, date of birth, ID band Patient awake    Reviewed: Allergy & Precautions, NPO status , Patient's Chart, lab work & pertinent test results  Airway Mallampati: III  TM Distance: >3 FB Neck ROM: Full    Dental no notable dental hx. (+) Teeth Intact   Pulmonary sleep apnea and Continuous Positive Airway Pressure Ventilation , former smoker,  Non compliant with CPAP   Pulmonary exam normal breath sounds clear to auscultation       Cardiovascular hypertension, Pt. on medications Normal cardiovascular exam Rhythm:Regular Rate:Normal     Neuro/Psych  Headaches, negative psych ROS   GI/Hepatic negative GI ROS, Neg liver ROS,   Endo/Other  Morbid obesity  Renal/GU negative Renal ROS  negative genitourinary   Musculoskeletal  (+) Arthritis , Osteoarthritis,    Abdominal (+) + obese,   Peds  Hematology  (+) anemia ,   Anesthesia Other Findings   Reproductive/Obstetrics Menorrhagia                            Anesthesia Physical Anesthesia Plan  ASA: III  Anesthesia Plan: General   Post-op Pain Management:    Induction: Intravenous  Airway Management Planned: LMA  Additional Equipment:   Intra-op Plan:   Post-operative Plan: Extubation in OR  Informed Consent: I have reviewed the patients History and Physical, chart, labs and discussed the procedure including the risks, benefits and alternatives for the proposed anesthesia with the patient or authorized representative who has indicated his/her understanding and acceptance.   Dental advisory given  Plan Discussed with: CRNA, Anesthesiologist and Surgeon  Anesthesia Plan Comments:         Anesthesia Quick Evaluation

## 2015-03-30 ENCOUNTER — Encounter (HOSPITAL_COMMUNITY): Payer: Self-pay | Admitting: Gynecology

## 2015-04-13 ENCOUNTER — Ambulatory Visit (INDEPENDENT_AMBULATORY_CARE_PROVIDER_SITE_OTHER): Payer: Self-pay | Admitting: Gynecology

## 2015-04-13 ENCOUNTER — Encounter: Payer: Self-pay | Admitting: Gynecology

## 2015-04-13 VITALS — BP 130/84

## 2015-04-13 DIAGNOSIS — Z9889 Other specified postprocedural states: Secondary | ICD-10-CM

## 2015-04-13 NOTE — Progress Notes (Signed)
Karen [name] [birth year] [id number]        45 y.o.  G2P2 presents for her postoperative visit status post NovaSure endometrial ablation 03/29/2015. Reports doing well with no bleeding.  Past medical history,surgical history, problem list, medications, allergies, family history and social history were all reviewed and documented in the EPIC chart.  Directed ROS with pertinent positives and negatives documented in the history of present illness/assessment and plan.  Exam: Karen [name] assistant Filed Vitals:   04/13/15 1246  BP: 130/84   General appearance:  Normal Abdomen obese without masses or tenderness Pelvic external BUS vagina normal. Cervix normal. Uterus difficult to palpate but grossly normal without masses or tenderness. Adnexa without masses or tenderness  Assessment/Plan:  45 y.o. G2P2 with normal postoperative visit status post NovaSure endometrial ablation. Patient brought her Depo-Provera that she was receiving to help suppress her menses. Reviewed with her that she no longer needs to take this. Using BTL for contraception. Hopefully after the NovaSure she will no longer have heavy menses. Patient will follow up in November 2017 when due for annual exam, sooner as needed.    Karen Auerbach MD, 12:53 PM 04/13/2015

## 2015-04-13 NOTE — Patient Instructions (Signed)
Follow up in November when you're due for your annual exam. Sooner if any issues.

## 2016-04-12 ENCOUNTER — Ambulatory Visit: Payer: Self-pay

## 2016-04-12 ENCOUNTER — Ambulatory Visit (INDEPENDENT_AMBULATORY_CARE_PROVIDER_SITE_OTHER): Payer: Self-pay | Admitting: Nurse Practitioner

## 2016-04-12 ENCOUNTER — Encounter: Payer: Self-pay | Admitting: Nurse Practitioner

## 2016-04-12 VITALS — BP 128/80 | HR 96 | Temp 98.2°F | Resp 20 | Ht 63.0 in | Wt 332.0 lb

## 2016-04-12 DIAGNOSIS — R059 Cough, unspecified: Secondary | ICD-10-CM

## 2016-04-12 DIAGNOSIS — J0100 Acute maxillary sinusitis, unspecified: Secondary | ICD-10-CM

## 2016-04-12 DIAGNOSIS — R05 Cough: Secondary | ICD-10-CM

## 2016-04-12 DIAGNOSIS — H6982 Other specified disorders of Eustachian tube, left ear: Secondary | ICD-10-CM

## 2016-04-12 LAB — POCT INFLUENZA A/B
INFLUENZA B, POC: NEGATIVE
Influenza A, POC: NEGATIVE

## 2016-04-12 MED ORDER — BENZONATATE 100 MG PO CAPS: 100.0000 mg | ORAL_CAPSULE | Freq: Two times a day (BID) | ORAL | 0 refills | Status: AC | PRN

## 2016-04-12 MED ORDER — AMOXICILLIN-POT CLAVULANATE 875-125 MG PO TABS: 1.0000 | ORAL_TABLET | Freq: Two times a day (BID) | ORAL | 0 refills | Status: AC

## 2016-04-12 MED ORDER — FLUTICASONE PROPIONATE 50 MCG/ACT NA SUSP: 2.0000 | Freq: Every day | NASAL | 0 refills | Status: DC

## 2016-04-12 MED ORDER — METHYLPREDNISOLONE ACETATE 80 MG/ML IJ SUSP
80.0000 mg | INTRAMUSCULAR | Status: AC
  Administered 2016-04-12: 80 mg via INTRAMUSCULAR

## 2016-04-12 NOTE — Progress Notes (Signed)
Subjective:     Karen [name] is a 46 y.o. female who presents for evaluation of symptoms of a URI. Symptoms include congestion, cough described as productive, headache described as yellow sputum, nasal congestion, productive cough with  yellow colored sputum, sinus pressure and sore throat. Onset of symptoms was 3 days ago, and has been gradually worsening since that time. Treatment to date: Nyquil and Mucinex DM.Marland Kitchen  The following portions of the patient's history were reviewed and updated as appropriate: allergies, current medications, past medical history and problem list.  Review of Systems Constitutional: positive for anorexia, fatigue, fevers and sweats Eyes: positive for watery eyes, watery Ears, nose, mouth, throat, and face: positive for pressure behind left ear. Rates 8/10, started 1/4 Respiratory: positive for cough and sputum Cardiovascular: negative Gastrointestinal: positive for nausea, vomiting and vomits with heavy coughing. Musculoskeletal:positive for myalgia Neurological: positive for headaches and frontal Allergic/Immunologic: negative   Objective:    BP 128/80   Pulse 96   Temp 98.2 F (36.8 C)   Resp 20   Ht 5\' 3"  (1.6 m)   Wt (!) 332 lb (150.6 kg)   BMI 58.81 kg/m  General appearance: alert, cooperative, fatigued and mild distress Head: Normocephalic, without obvious abnormality, atraumatic Eyes: conjunctivae/corneas clear. PERRL, EOM's intact. Fundi benign. Ears: normal TM's and external ear canals both ears Nose: Nares normal. Septum midline. Mucosa normal. No drainage or sinus tenderness., no discharge, moderate maxillary sinus tenderness bilateral Throat: lips, mucosa, and tongue normal; teeth and gums normal Lungs: clear to auscultation bilaterally Heart: regular rate and rhythm, S1, S2 normal, no murmur, click, rub or gallop Abdomen: soft, non-tender; bowel sounds normal; no masses,  no organomegaly Skin: Skin color, texture, turgor normal. No  rashes or lesions Neurologic: Alert and oriented X 3, normal strength and tone. Normal symmetric reflexes. Normal coordination and gait   Assessment:    sinusitis and Eustachian Tube Dysfunction and Cough   Plan:    Discussed diagnosis and treatment of URI. Suggested symptomatic OTC remedies. Nasal saline spray for congestion. Follow up as needed.   Patient given prescription for Augmentin.  Will use in 2-3 days if no improvement.  Patient will use Flonase for nasal symptoms and Zyrtec daily.

## 2016-04-12 NOTE — Patient Instructions (Addendum)
Cough, Adult Coughing is a reflex that clears your throat and your airways. Coughing helps to heal and protect your lungs. It is normal to cough occasionally, but a cough that happens with other symptoms or lasts a long time may be a sign of a condition that needs treatment. A cough may last only 2-3 weeks (acute), or it may last longer than 8 weeks (chronic). What are the causes? Coughing is commonly caused by:  Breathing in substances that irritate your lungs.  A viral or bacterial respiratory infection.  Allergies.  Asthma.  Postnasal drip.  Smoking.  Acid backing up from the stomach into the esophagus (gastroesophageal reflux).  Certain medicines.  Chronic lung problems, including COPD (or rarely, lung cancer).  Other medical conditions such as heart failure. Follow these instructions at home: Pay attention to any changes in your symptoms. Take these actions to help with your discomfort:  Take medicines only as told by your health care provider.  If you were prescribed an antibiotic medicine, take it as told by your health care provider. Do not stop taking the antibiotic even if you start to feel better.  Talk with your health care provider before you take a cough suppressant medicine.  Drink enough fluid to keep your urine clear or pale yellow.  If the air is dry, use a cold steam vaporizer or humidifier in your bedroom or your home to help loosen secretions.  Avoid anything that causes you to cough at work or at home.  If your cough is worse at night, try sleeping in a semi-upright position.  Avoid cigarette smoke. If you smoke, quit smoking. If you need help quitting, ask your health care provider.  Avoid caffeine.  Avoid alcohol.  Rest as needed. Contact a health care provider if:  You have new symptoms.  You cough up pus.  Your cough does not get better after 2-3 weeks, or your cough gets worse.  You cannot control your cough with suppressant medicines  and you are losing sleep.  You develop pain that is getting worse or pain that is not controlled with pain medicines.  You have a fever.  You have unexplained weight loss.  You have night sweats. Get help right away if:  You cough up blood.  You have difficulty breathing.  Your heartbeat is very fast. This information is not intended to replace advice given to you by your health care provider. Make sure you discuss any questions you have with your health care provider. Document Released: 09/22/2010 Document Revised: 09/01/2015 Document Reviewed: 06/02/2014 Elsevier Interactive Patient Education  2017 [employer].  Eustachian Tube Dysfunction Introduction The eustachian tube connects the middle ear to the back of the nose. It regulates air pressure in the middle ear by allowing air to move between the ear and nose. It also helps to drain fluid from the middle ear space. When the eustachian tube does not function properly, air pressure, fluid, or both can build up in the middle ear. Eustachian tube dysfunction can affect one or both ears. What are the causes? This condition happens when the eustachian tube becomes blocked or cannot open normally. This may result from:  Ear infections.  Colds and other upper respiratory infections.  Allergies.  Irritation, such as from cigarette smoke or acid from the stomach coming up into the esophagus (gastroesophageal reflux).  Sudden changes in air pressure, such as from descending in an airplane.  Abnormal growths in the nose or throat, such as nasal polyps, tumors,  or enlarged tissue at the back of the throat (adenoids). What increases the risk? This condition may be more likely to develop in people who smoke and people who are overweight. Eustachian tube dysfunction may also be more likely to develop in children, especially children who have:  Certain birth defects of the mouth, such as cleft palate.  Large tonsils and  adenoids. What are the signs or symptoms? Symptoms of this condition may include:  A feeling of fullness in the ear.  Ear pain.  Clicking or popping noises in the ear.  Ringing in the ear.  Hearing loss.  Loss of balance. Symptoms may get worse when the air pressure around you changes, such as when you travel to an area of high elevation or fly on an airplane. How is this diagnosed? This condition may be diagnosed based on:  Your symptoms.  A physical exam of your ear, nose, and throat.  Tests, such as those that measure:  The movement of your eardrum (tympanogram).  Your hearing (audiometry). How is this treated? Treatment depends on the cause and severity of your condition. If your symptoms are mild, you may be able to relieve your symptoms by moving air into ("popping") your ears. If you have symptoms of fluid in your ears, treatment may include:  Decongestants.  Antihistamines.  Nasal sprays or ear drops that contain medicines that reduce swelling (steroids). In some cases, you may need to have a procedure to drain the fluid in your eardrum (myringotomy). In this procedure, a small tube is placed in the eardrum to:  Drain the fluid.  Restore the air in the middle ear space. Follow these instructions at home:  Take over-the-counter and prescription medicines only as told by your health care provider.  Use techniques to help pop your ears as recommended by your health care provider. These may include:  Chewing gum.  Yawning.  Frequent, forceful swallowing.  Closing your mouth, holding your nose closed, and gently blowing as if you are trying to blow air out of your nose.  Do not do any of the following until your health care provider approves:  Travel to high altitudes.  Fly in airplanes.  Work in a Pension scheme manager or room.  Scuba dive.  Keep your ears dry. Dry your ears completely after showering or bathing.  Do not smoke.  Keep all follow-up  visits as told by your health care provider. This is important. Contact a health care provider if:  Your symptoms do not go away after treatment.  Your symptoms come back after treatment.  You are unable to pop your ears.  You have:  A fever.  Pain in your ear.  Pain in your head or neck.  Fluid draining from your ear.  Your hearing suddenly changes.  You become very dizzy.  You lose your balance. This information is not intended to replace advice given to you by your health care provider. Make sure you discuss any questions you have with your health care provider. Document Released: 04/22/2015 Document Revised: 09/01/2015 Document Reviewed: 04/14/2014  2017 Else[employer]  Sinusitis, Adult Sinusitis is soreness and inflammation of your sinuses. Sinuses are hollow spaces in the bones around your face. Your sinuses are located:  Around your eyes.  In the middle of your forehead.  Behind your nose.  In your cheekbones. Your sinuses and nasal passages are lined with a stringy fluid (mucus). Mucus normally drains out of your sinuses. When your nasal tissues become inflamed or swollen, the  mucus can become trapped or blocked so air cannot flow through your sinuses. This allows bacteria, viruses, and funguses to grow, which leads to infection. Sinusitis can develop quickly and last for 7?10 days (acute) or for more than 12 weeks (chronic). Sinusitis often develops after a cold. What are the causes? This condition is caused by anything that creates swelling in the sinuses or stops mucus from draining, including:  Allergies.  Asthma.  Bacterial or viral infection.  Abnormally shaped bones between the nasal passages.  Nasal growths that contain mucus (nasal polyps).  Narrow sinus openings.  Pollutants, such as chemicals or irritants in the air.  A foreign object stuck in the nose.  A fungal infection. This is rare. What increases the risk? The following factors may make  you more likely to develop this condition:  Having allergies or asthma.  Having had a recent cold or respiratory tract infection.  Having structural deformities or blockages in your nose or sinuses.  Having a weak immune system.  Doing a lot of swimming or diving.  Overusing nasal sprays.  Smoking. What are the signs or symptoms? The main symptoms of this condition are pain and a feeling of pressure around the affected sinuses. Other symptoms include:  Upper toothache.  Earache.  Headache.  Bad breath.  Decreased sense of smell and taste.  A cough that may get worse at night.  Fatigue.  Fever.  Thick drainage from your nose. The drainage is often green and it may contain pus (purulent).  Stuffy nose or congestion.  Postnasal drip. This is when extra mucus collects in the throat or back of the nose.  Swelling and warmth over the affected sinuses.  Sore throat.  Sensitivity to light. How is this diagnosed? This condition is diagnosed based on symptoms, a medical history, and a physical exam. To find out if your condition is acute or chronic, your health care provider may:  Look in your nose for signs of nasal polyps.  Tap over the affected sinus to check for signs of infection.  View the inside of your sinuses using an imaging device that has a light attached (endoscope). If your health care provider suspects that you have chronic sinusitis, you may also:  Be tested for allergies.  Have a sample of mucus taken from your nose (nasal culture) and checked for bacteria.  Have a mucus sample examined to see if your sinusitis is related to an allergy. If your sinusitis does not respond to treatment and it lasts longer than 8 weeks, you may have an MRI or CT scan to check your sinuses. These scans also help to determine how severe your infection is. In rare cases, a bone biopsy may be done to rule out more serious types of fungal sinus disease. How is this  treated? Treatment for sinusitis depends on the cause and whether your condition is chronic or acute. If a virus is causing your sinusitis, your symptoms will go away on their own within 10 days. You may be given medicines to relieve your symptoms, including:  Topical nasal decongestants. They shrink swollen nasal passages and let mucus drain from your sinuses.  Antihistamines. These drugs block inflammation that is triggered by allergies. This can help to ease swelling in your nose and sinuses.  Topical nasal corticosteroids. These are nasal sprays that ease inflammation and swelling in your nose and sinuses.  Nasal saline washes. These rinses can help to get rid of thick mucus in your nose. If your  condition is caused by bacteria, you will be given an antibiotic medicine. If your condition is caused by a fungus, you will be given an antifungal medicine. Surgery may be needed to correct underlying conditions, such as narrow nasal passages. Surgery may also be needed to remove polyps. Follow these instructions at home: Medicines  Take, use, or apply over-the-counter and prescription medicines only as told by your health care provider. These may include nasal sprays.  If you were prescribed an antibiotic medicine, take it as told by your health care provider. Do not stop taking the antibiotic even if you start to feel better. Hydrate and Humidify  Drink enough water to keep your urine clear or pale yellow. Staying hydrated will help to thin your mucus.  Use a cool mist humidifier to keep the humidity level in your home above 50%.  Inhale steam for 10-15 minutes, 3-4 times a day or as told by your health care provider. You can do this in the bathroom while a hot shower is running.  Limit your exposure to cool or dry air. Rest  Rest as much as possible.  Sleep with your head raised (elevated).  Make sure to get enough sleep each night. General instructions  Apply a warm, moist  washcloth to your face 3-4 times a day or as told by your health care provider. This will help with discomfort.  Wash your hands often with soap and water to reduce your exposure to viruses and other germs. If soap and water are not available, use hand sanitizer.  Do not smoke. Avoid being around people who are smoking (secondhand smoke).  Keep all follow-up visits as told by your health care provider. This is important. Contact a health care provider if:  You have a fever.  Your symptoms get worse.  Your symptoms do not improve within 10 days. Get help right away if:  You have a severe headache.  You have persistent vomiting.  You have pain or swelling around your face or eyes.  You have vision problems.  You develop confusion.  Your neck is stiff.  You have trouble breathing. This information is not intended to replace advice given to you by your health care provider. Make sure you discuss any questions you have with your health care provider. Document Released: 03/26/2005 Document Revised: 11/20/2015 Document Reviewed: 01/19/2015 Elsevier Interactive Patient Education  2017 [employer].

## 2016-08-01 ENCOUNTER — Emergency Department (HOSPITAL_COMMUNITY)
Admission: EM | Admit: 2016-08-01 | Discharge: 2016-08-02 | Disposition: A | Discharge status: Home / Self Care | Payer: Self-pay | Attending: Physician Assistant | Admitting: Physician Assistant

## 2016-08-01 ENCOUNTER — Emergency Department (HOSPITAL_COMMUNITY): Payer: Self-pay

## 2016-08-01 ENCOUNTER — Encounter (HOSPITAL_COMMUNITY): Payer: Self-pay | Admitting: Emergency Medicine

## 2016-08-01 DIAGNOSIS — M5410 Radiculopathy, site unspecified: Secondary | ICD-10-CM | POA: Insufficient documentation

## 2016-08-01 DIAGNOSIS — Z87891 Personal history of nicotine dependence: Secondary | ICD-10-CM | POA: Insufficient documentation

## 2016-08-01 DIAGNOSIS — I10 Essential (primary) hypertension: Secondary | ICD-10-CM | POA: Insufficient documentation

## 2016-08-01 DIAGNOSIS — R0789 Other chest pain: Secondary | ICD-10-CM | POA: Insufficient documentation

## 2016-08-01 DIAGNOSIS — R200 Anesthesia of skin: Secondary | ICD-10-CM

## 2016-08-01 DIAGNOSIS — Z79899 Other long term (current) drug therapy: Secondary | ICD-10-CM | POA: Insufficient documentation

## 2016-08-01 NOTE — ED Triage Notes (Signed)
Pt states she has had pain in her neck since Sunday but today she started having numbness in the left side of her face and down her left arm  No neuro deficits noted  Speech clear

## 2016-08-01 NOTE — ED Provider Notes (Signed)
[facility] DEPT Provider Note   CSN: [id number] Arrival date & time: 08/01/16  1946   By signing my name below, I, Eunice Blase, attest that this documentation has been prepared under the direction and in the presence of [employer], PA-C. Electronically Signed: Eunice Blase, Scribe. 08/01/16. 11:28 PM.   History   Chief Complaint Chief Complaint  Patient presents with  . Numbness   The history is provided by the patient and medical records. No language interpreter was used.    Karen [name] is a 46 y.o. female with h/o HTN and anemia, to the Emergency Department with concern for waxing waning moderate to severe chest pressure x 4 days. She states this chest pressure pain around 4-5 PM today while sitting down. Pain has subsided currently. Associated L shoulder numbness and tightness, paresthesias on L fingers and toes, nausea onset with pain, loss of sensation on the L extremities, unsteadiness over the past month, increased stress, decreased sleep noted. She states this pain is worse with deep breathing and it radiates to the L side of her neck. She states she took two tylenol at home this evening with no note on modification to her symptoms. No other modifying factors noted. Pt states she has to use sleep aids nightly. Pt not currently taking HTN medications, though she states she was previously prescribed them and stopped taking them AMA. She adds she does not take any daily medications. Pt states her father had a heart attack at age 26 and her aunt had a stroke at a young age. No SOB, lightheadedness, syncope, fevers, vomiting, visual changes, ringing in the ears, hearing loss, weakness, injury, diaphoresis, trauma or any other complaints noted at this time.      Past Medical History:  Diagnosis Date  . Anemia   . Arthritis    Left knee  . Headache   . Hypertension     Patient Active Problem List   Diagnosis Date Noted  . Menorrhagia 02/22/2015  . Morbid obesity  ([place]) 11/10/2012    Past Surgical History:  Procedure Laterality Date  . CHOLECYSTECTOMY    . gastic sleeve  2015  . HYSTEROSCOPY WITH NOVASURE N/A 03/29/2015   Procedure: HYSTEROSCOPY WITH NOVASURE;  Surgeon: Anastasio Auerbach, MD;  Location: [facility];  Service: Gynecology;  Laterality: N/A;  to follow first case around 10:15am.  Requests one hour OR time.  . INTRAUTERINE DEVICE INSERTION     X 2  Spontaneously extruded X 2.   2014/2015  . TUBAL LIGATION  approximately 2000   as an interval procedure historically    OB History    Gravida Para Term Preterm AB Living   2 2           SAB TAB Ectopic Multiple Live Births                   Home Medications    Prior to Admission medications   Medication Sig Start Date End Date Taking? Authorizing Provider  b complex vitamins tablet Take 1 tablet by mouth daily.   Yes Historical Provider, MD  Calcium Citrate-Vitamin D (CALCIUM CITRATE + PO) Take 1 tablet by mouth daily.   Yes Historical Provider, MD  Cholecalciferol (VITAMIN D PO) Take 1 capsule by mouth daily.    Yes Historical Provider, MD  Cyanocobalamin (VITAMIN B-12 PO) Take 1 tablet by mouth daily.   Yes Historical Provider, MD  IRON PO Take 1 tablet by mouth daily.   Yes Historical  Provider, MD  Liniments (DEEP BLUE RELIEF) GEL Apply 1 application topically daily as needed (for knee pain).   Yes Historical Provider, MD  lisinopril-hydrochlorothiazide (PRINZIDE,ZESTORETIC) 20-25 MG tablet Take 1 tablet by mouth daily.   Yes Historical Provider, MD  Multiple Vitamin (MULTIVITAMIN WITH MINERALS) TABS tablet Take 1 tablet by mouth daily.   Yes Historical Provider, MD  Multiple Vitamins-Minerals (HAIR/SKIN/NAILS PO) Take 1 tablet by mouth daily.   Yes Historical Provider, MD  Omega-3 Fatty Acids (FISH OIL PO) Take 1 capsule by mouth daily.   Yes Historical Provider, MD  fluticasone (FLONASE) 50 MCG/ACT nasal spray Place 2 sprays into both nostrils daily. Patient not taking:  Reported on 08/01/2016 04/12/16 04/22/16  Kara Dies, NP    Family History Family History  Problem Relation Age of Onset  . Hyperlipidemia Father   . Heart attack Father   . Heart attack Other   . Hypertension Other   . Cancer Other   . Stroke Other   . Hypertension Mother   . Parkinson's disease Mother     Social History Social History  Substance Use Topics  . Smoking status: Former Research scientist (life sciences)  . Smokeless tobacco: Never Used  . Alcohol use No     Allergies   Nsaids and Other   Review of Systems Review of Systems All other systems reviewed and all systems are negative for acute changes except as noted in the HPI and PMH.     Physical Exam Updated Vital Signs BP 125/68 (BP Location: Left Arm)   Pulse 78   Temp 98.5 F (36.9 C) (Oral)   Resp 18   SpO2 99%   Physical Exam  Constitutional: She is oriented to person, place, and time. She appears well-developed and well-nourished. No distress.  Calm and cooperative. Patient in no acute distress. Nontoxic appearing. Obese.  HENT:  Head: Normocephalic and atraumatic.  Mouth/Throat: Oropharynx is clear and moist.  Eyes: Conjunctivae and EOM are normal. Pupils are equal, round, and reactive to light. No scleral icterus.  Neck: Normal range of motion.  No meningismus  Cardiovascular: Normal rate, regular rhythm and intact distal pulses.   Pulmonary/Chest: Effort normal. No respiratory distress. She has no wheezes. She has no rales.  Respirations even and unlabored. Lungs clear to auscultation bilaterally.  Musculoskeletal: Normal range of motion.  Neurological: She is alert and oriented to person, place, and time. No cranial nerve deficit. She exhibits normal muscle tone. Coordination normal.  GCS 15. Speech is goal oriented. No cranial nerve deficits appreciated; symmetric eyebrow raise, no facial drooping, tongue midline. Patient has equal grip strength bilaterally with 5/5 strength against resistance in all major  muscle groups bilaterally. Sensation to light touch intact. Patient moves extremities without ataxia. Normal finger-nose-finger. Patient ambulatory with steady gait.  Skin: Skin is warm and dry. No rash noted. She is not diaphoretic. No erythema. No pallor.  Psychiatric: She has a normal mood and affect. Her behavior is normal.  Nursing note and vitals reviewed.    ED Treatments / Results  DIAGNOSTIC STUDIES: Oxygen Saturation is 99% on RA, NL by my interpretation.    COORDINATION OF CARE: 10:54 PM-Discussed next steps with pt. Pt verbalized understanding and is agreeable with the plan. Will order imaging, labs and consult attending before reassessing pt.   Labs (all labs ordered are listed, but only abnormal results are displayed) Labs Reviewed  CBC - Abnormal; Notable for the following:       Result Value   WBC  11.7 (*)    All other components within normal limits  PROTIME-INR  APTT  DIFFERENTIAL  COMPREHENSIVE METABOLIC PANEL  I-STAT CHEM 8, ED  I-STAT TROPOININ, ED    EKG  EKG Interpretation None       Radiology Dg Chest 2 View  Result Date: 08/01/2016 CLINICAL DATA:  Dyspnea and chest tightness EXAM: CHEST  2 VIEW COMPARISON:  08/25/2013 CXR FINDINGS: The heart size and mediastinal contours are within normal limits. Both lungs are clear. The visualized skeletal structures are unremarkable. IMPRESSION: No active cardiopulmonary disease. Electronically Signed   By: Ashley Royalty M.D.   On: 08/01/2016 23:48   Ct Head Wo Contrast  Result Date: 08/01/2016 CLINICAL DATA:  Acute onset of neck pain and left-sided facial numbness. Numbness extends down the left arm. Initial encounter. EXAM: CT HEAD WITHOUT CONTRAST TECHNIQUE: Contiguous axial images were obtained from the base of the skull through the vertex without intravenous contrast. COMPARISON:  None. FINDINGS: Brain: No evidence of acute infarction, hemorrhage, hydrocephalus, extra-axial collection or mass lesion/mass  effect. The posterior fossa, including the cerebellum, brainstem and fourth ventricle, is within normal limits. The third and lateral ventricles, and basal ganglia are unremarkable in appearance. The cerebral hemispheres are symmetric in appearance, with normal gray-white differentiation. No mass effect or midline shift is seen. Vascular: No hyperdense vessel or unexpected calcification. Skull: There is no evidence of fracture; visualized osseous structures are unremarkable in appearance. Sinuses/Orbits: The orbits are within normal limits. The paranasal sinuses and mastoid air cells are well-aerated. Other: No significant soft tissue abnormalities are seen. IMPRESSION: Unremarkable noncontrast CT of the head. Electronically Signed   By: Garald Balding M.D.   On: 08/01/2016 23:44     Procedures Procedures (including critical care time)  Medications Ordered in ED Medications - No data to display   Initial Impression / Assessment and Plan / ED Course  I have reviewed the triage vital signs and the nursing notes.  Pertinent labs & imaging results that were available during my care of the patient were reviewed by me and considered in my medical decision making (see chart for details).     46 year old female presents to the emergency department for multiple complaints. Her primary complaint is of chest tightness which has been waxing and waning over the last 4 days. Pain is now constant. Patient also reporting associated paresthesias and subjective numbness in her left upper extremity. During my assessment she also commented that upper extremity paresthesias were associated with paresthesias in her left lower extremity. Patient subsequently denied this being the case when evaluated by my attending, Dr. Thomasene Lot.  Full workup was completed including labs, chest x-ray, and head CT. Workup today reassuring without evidence of acute or emergent process. Results discussed with the patient in their entirety.  She verbalizes understanding. Given subjective reports of paresthesias, and MRI was offered to the patient to further evaluate for potential stroke. Patient declines further imaging, stating that she is very claustrophobic. She states that she would prefer to follow up with a neurologist on an outpatient basis. I do not believe this is unreasonable, especially in light of symptom chronicity. I also suspect her symptoms are more likely secondary to a peripheral cause of radiculopathy.  Referrals provided for outpatient follow-up. Return precautions discussed and provided. Patient discharged in stable condition with no unaddressed concerns. Patient examined also by my attending, Dr. Thomasene Lot, who is in agreement with this workup, assessment, management plan, and patient's stability for discharge.   Final  Clinical Impressions(s) / ED Diagnoses   Final diagnoses:  Radiculopathy affecting upper extremity  Left sided numbness    New Prescriptions New Prescriptions   No medications on file    I personally performed the services described in this documentation, which was scribed in my presence. The recorded information has been reviewed and is accurate.      Antonietta Breach, PA-C 08/22/16 [id number]    Macarthur Critchley, MD 08/22/16 2029

## 2016-08-02 LAB — I-STAT TROPONIN, ED: TROPONIN I, POC: 0 ng/mL (ref 0.00–0.08)

## 2016-08-02 LAB — CBC
HCT: 41.7 % (ref 36.0–46.0)
Hemoglobin: 13.9 g/dL (ref 12.0–15.0)
MCH: 28.1 pg (ref 26.0–34.0)
MCHC: 33.3 g/dL (ref 30.0–36.0)
MCV: 84.4 fL (ref 78.0–100.0)
PLATELETS: 314 10*3/uL (ref 150–400)
RBC: 4.94 MIL/uL (ref 3.87–5.11)
RDW: 14.2 % (ref 11.5–15.5)
WBC: 11.7 10*3/uL — ABNORMAL HIGH (ref 4.0–10.5)

## 2016-08-02 LAB — COMPREHENSIVE METABOLIC PANEL
ALK PHOS: 83 U/L (ref 38–126)
ALT: 17 U/L (ref 14–54)
ANION GAP: 9 (ref 5–15)
AST: 19 U/L (ref 15–41)
Albumin: 3.9 g/dL (ref 3.5–5.0)
BILIRUBIN TOTAL: 0.6 mg/dL (ref 0.3–1.2)
BUN: 9 mg/dL (ref 6–20)
CALCIUM: 9.5 mg/dL (ref 8.9–10.3)
CO2: 26 mmol/L (ref 22–32)
Chloride: 102 mmol/L (ref 101–111)
Creatinine, Ser: 0.67 mg/dL (ref 0.44–1.00)
Glucose, Bld: 88 mg/dL (ref 65–99)
Potassium: 3.8 mmol/L (ref 3.5–5.1)
SODIUM: 137 mmol/L (ref 135–145)
TOTAL PROTEIN: 8.1 g/dL (ref 6.5–8.1)

## 2016-08-02 LAB — DIFFERENTIAL
Basophils Absolute: 0 10*3/uL (ref 0.0–0.1)
Basophils Relative: 0 %
EOS PCT: 1 %
Eosinophils Absolute: 0.1 10*3/uL (ref 0.0–0.7)
LYMPHS PCT: 32 %
Lymphs Abs: 3.7 10*3/uL (ref 0.7–4.0)
MONO ABS: 0.9 10*3/uL (ref 0.1–1.0)
MONOS PCT: 8 %
Neutro Abs: 6.9 10*3/uL (ref 1.7–7.7)
Neutrophils Relative %: 59 %

## 2016-08-02 LAB — PROTIME-INR
INR: 1.03
PROTHROMBIN TIME: 13.5 s (ref 11.4–15.2)

## 2016-08-02 LAB — APTT: aPTT: 27 seconds (ref 24–36)

## 2016-08-02 NOTE — Discharge Instructions (Signed)
Your workup today was reassuring. We advise that you schedule follow-up within one month with a neurologist for further evaluation of your symptoms. We recommend continued use of Tylenol as needed for pain or discomfort. Return to the emergency department for new or concerning symptoms.

## 2016-08-22 ENCOUNTER — Encounter: Payer: Self-pay | Admitting: Gynecology

## 2016-09-10 ENCOUNTER — Ambulatory Visit: Payer: Self-pay | Admitting: Neurology

## 2016-10-26 DIAGNOSIS — Z8679 Personal history of other diseases of the circulatory system: Secondary | ICD-10-CM | POA: Insufficient documentation

## 2016-10-26 DIAGNOSIS — I10 Essential (primary) hypertension: Secondary | ICD-10-CM

## 2016-10-26 HISTORY — DX: Essential (primary) hypertension: I10

## 2017-08-21 ENCOUNTER — Ambulatory Visit (INDEPENDENT_AMBULATORY_CARE_PROVIDER_SITE_OTHER): Payer: Self-pay | Admitting: Orthopedic Surgery

## 2017-08-28 ENCOUNTER — Ambulatory Visit (INDEPENDENT_AMBULATORY_CARE_PROVIDER_SITE_OTHER): Payer: BLUE CROSS/BLUE SHIELD

## 2017-08-28 ENCOUNTER — Ambulatory Visit (INDEPENDENT_AMBULATORY_CARE_PROVIDER_SITE_OTHER): Payer: BLUE CROSS/BLUE SHIELD | Admitting: Orthopedic Surgery

## 2017-08-28 ENCOUNTER — Encounter (INDEPENDENT_AMBULATORY_CARE_PROVIDER_SITE_OTHER): Payer: Self-pay | Admitting: Orthopedic Surgery

## 2017-08-28 DIAGNOSIS — G8929 Other chronic pain: Secondary | ICD-10-CM | POA: Diagnosis not present

## 2017-08-28 DIAGNOSIS — M25562 Pain in left knee: Secondary | ICD-10-CM

## 2017-08-28 DIAGNOSIS — M1712 Unilateral primary osteoarthritis, left knee: Secondary | ICD-10-CM

## 2017-08-28 DIAGNOSIS — M1711 Unilateral primary osteoarthritis, right knee: Secondary | ICD-10-CM | POA: Diagnosis not present

## 2017-08-28 DIAGNOSIS — M25561 Pain in right knee: Secondary | ICD-10-CM

## 2017-08-28 MED ORDER — BUPIVACAINE HCL 0.25 % IJ SOLN
4.0000 mL | INTRAMUSCULAR | Status: AC | PRN
  Administered 2017-08-28: 4 mL via INTRA_ARTICULAR

## 2017-08-28 MED ORDER — METHYLPREDNISOLONE ACETATE 40 MG/ML IJ SUSP
40.0000 mg | INTRAMUSCULAR | Status: AC | PRN
  Administered 2017-08-28: 40 mg via INTRA_ARTICULAR

## 2017-08-28 MED ORDER — LIDOCAINE HCL 1 % IJ SOLN
5.0000 mL | INTRAMUSCULAR | Status: AC | PRN
  Administered 2017-08-28: 5 mL

## 2017-08-28 NOTE — Progress Notes (Addendum)
Office Visit Note   Patient: Karen [name]           Date of Birth: [birth year]           MRN: [id number] Visit Date: 08/28/2017 Requested by: Glendon Axe, MD [place], [place] [address] PCP: Glendon Axe, MD Subjective: Chief Complaint  Patient presents with  . Left Knee - Pain  . Right Knee - Pain    [id number] presents for bilateral knee pain right worse than left.  She ambulates with a cane.  The right knee pain started last year.  Left knee pain is been more chronic.  The right knee hurts worse.  She reports burning tingling numbness and some pressure in the knee.  Cortisone injections have helped for a month in the past.  She is taking ibuprofen.  She reports some cracking popping in both knees.  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  The pain will wake her from sleep.  Her walking endurance has decreased.  She does do water aerobics.              ROS: All systems reviewed are negative as they relate to the chief complaint within the history of present illness.  Patient denies  fevers or chills. Impression is  Assessment & Plan: Visit Diagnoses:  1. Chronic pain of both knees   2. Unilateral primary osteoarthritis, left knee   3. Unilateral primary osteoarthritis, right knee     Plan: End-stage arthritis in patient with morbid obesity.  BMI over 50 at this time.  She needs to get down into the 225 range in order to be below 40 BMI.  Hemoglobin A1c not an issue.  Injection performed today into the right knee for temporary pain relief.  Follow-up with me as needed  Follow-Up Instructions: Return if symptoms worsen or fail to improve.   Orders:  Orders Placed This Encounter  Procedures  . XR KNEE 3 VIEW LEFT  . XR KNEE 3 VIEW RIGHT   No orders of the defined types were placed in this encounter.     Procedures: Large Joint Inj: R knee on 08/28/2017 2:01 PM Indications: diagnostic evaluation, joint swelling and pain Details: 18 G 1.5 in  needle, superolateral approach  Arthrogram: No  Medications: 5 mL lidocaine 1 %; 40 mg methylPREDNISolone acetate 40 MG/ML; 4 mL bupivacaine 0.25 % Outcome: tolerated well, no immediate complications Procedure, treatment alternatives, risks and benefits explained, specific risks discussed. Consent was given by the patient. Immediately prior to procedure a time out was called to verify the correct patient, procedure, equipment, support staff and site/side marked as required. Patient was prepped and draped in the usual sterile fashion.       Clinical Data: No additional findings.  Objective: Vital Signs: There were no vitals taken for this visit.  Physical Exam:   Constitutional: Patient appears well-developed HEENT:  Head: Normocephalic Eyes:EOM are normal Neck: Normal range of motion Cardiovascular: Normal rate Pulmonary/chest: Effort normal Neurologic: Patient is alert Skin: Skin is warm Psychiatric: Patient has normal mood and affect    Ortho Exam: Orthopedic exam demonstrates increased body mass index with slight varus alignment bilateral lower extremities.  Pedal pulses palpable.  Mild effusion present in both knees.  Extensor mechanism is intact.  Patient has about 60 to 70 degrees of flexion in both knees.  No groin pain with internal/external rotation of the leg.  Specialty Comments:  No specialty comments available.  Imaging: Xr  Knee 3 View Left  Result Date: 08/28/2017 AP lateral merchant left knee reviewed.  Varus alignment present.  End-stage medial compartment arthritis is present in the midst of general tricompartmental degenerative changes.  No fracture or dislocation is seen.  Soft tissue envelope is large.  Xr Knee 3 View Right  Result Date: 08/28/2017 AP lateral merchant right knee reviewed.  Tricompartmental osteoarthritis is present worse in the medial compartment.  Varus alignment also present.  No fracture dislocation seen.  Soft tissue envelope  large    PMFS History: Patient Active Problem List   Diagnosis Date Noted  . Menorrhagia 02/22/2015  . Morbid obesity ([place]) 11/10/2012   Past Medical History:  Diagnosis Date  . Anemia   . Arthritis    Left knee  . Headache   . Hypertension     Family History  Problem Relation Age of Onset  . Hyperlipidemia Father   . Heart attack Father   . Heart attack Other   . Hypertension Other   . Cancer Other   . Stroke Other   . Hypertension Mother   . Parkinson's disease Mother     Past Surgical History:  Procedure Laterality Date  . CHOLECYSTECTOMY    . gastic sleeve  2015  . HYSTEROSCOPY WITH NOVASURE N/A 03/29/2015   Procedure: HYSTEROSCOPY WITH NOVASURE;  Surgeon: Anastasio Auerbach, MD;  Location: [facility];  Service: Gynecology;  Laterality: N/A;  to follow first case around 10:15am.  Requests one hour OR time.  . INTRAUTERINE DEVICE INSERTION     X 2  Spontaneously extruded X 2.   2014/2015  . TUBAL LIGATION  approximately 2000   as an interval procedure historically   Social History   Occupational History  . Not on file  Tobacco Use  . Smoking status: Former Research scientist (life sciences)  . Smokeless tobacco: Never Used  Substance and Sexual Activity  . Alcohol use: No  . Drug use: No  . Sexual activity: Yes    Birth control/protection: Surgical    Comment: BTL

## 2017-12-12 ENCOUNTER — Telehealth (INDEPENDENT_AMBULATORY_CARE_PROVIDER_SITE_OTHER): Payer: Self-pay | Admitting: Orthopedic Surgery

## 2017-12-12 NOTE — Telephone Encounter (Signed)
Patient called concerning knee replacement surgery. Patient asked if she need to be a certain height and weight in order to have surgery done? Patient asked if a message can be left if she can't answer her phone.The number to contact patient is [phone]

## 2017-12-12 NOTE — Telephone Encounter (Signed)
FYI IC patient, no answer. LMVM for her and advised typically Dr Marlou Sa prefers for BMI to be 40 or less for knee replacement surgery.

## 2017-12-12 NOTE — Telephone Encounter (Signed)
Please advise 

## 2017-12-12 NOTE — Telephone Encounter (Signed)
Ok thx.

## 2017-12-12 NOTE — Telephone Encounter (Signed)
Patient called back and I advised her of the same message you left on her vm.  She wanted to know if the BMI under 40 is only Dr. 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  If Dr. Marlou Sa will not perform the surgery due to her BMI then she would like to be referred to another provider that will perform the procedure.  Thank you.

## 2017-12-13 NOTE — Telephone Encounter (Signed)
Waiting to be advised by Dr Dean.  

## 2017-12-13 NOTE — Telephone Encounter (Signed)
I checked with 2 of my partners and they are reluctant to perform total knee replacement on someone with body mass index over 50.  This is in line with current literature.  She can try another practice if she would like.  The ones to try would be [facility] and [facility]

## 2017-12-13 NOTE — Telephone Encounter (Signed)
Patient returned your call # [phone]

## 2017-12-16 NOTE — Telephone Encounter (Signed)
Tried calling pt to discuss. No answer LM for her to call me back to discuss message below per Dr Marlou Sa.

## 2017-12-18 ENCOUNTER — Ambulatory Visit (INDEPENDENT_AMBULATORY_CARE_PROVIDER_SITE_OTHER): Payer: BLUE CROSS/BLUE SHIELD | Admitting: Orthopedic Surgery

## 2018-01-21 ENCOUNTER — Telehealth (INDEPENDENT_AMBULATORY_CARE_PROVIDER_SITE_OTHER): Payer: Self-pay | Admitting: Orthopedic Surgery

## 2018-01-21 NOTE — Telephone Encounter (Signed)
Returned call to patient left message to return call °

## 2018-01-27 DIAGNOSIS — M79641 Pain in right hand: Secondary | ICD-10-CM | POA: Insufficient documentation

## 2018-01-27 HISTORY — DX: Pain in right hand: M79.641

## 2018-02-10 DIAGNOSIS — G5601 Carpal tunnel syndrome, right upper limb: Secondary | ICD-10-CM | POA: Insufficient documentation

## 2018-02-10 HISTORY — DX: Carpal tunnel syndrome, right upper limb: G56.01

## 2018-03-26 ENCOUNTER — Encounter (INDEPENDENT_AMBULATORY_CARE_PROVIDER_SITE_OTHER): Payer: Self-pay | Admitting: Family Medicine

## 2018-03-26 ENCOUNTER — Ambulatory Visit (INDEPENDENT_AMBULATORY_CARE_PROVIDER_SITE_OTHER): Payer: BLUE CROSS/BLUE SHIELD | Admitting: Family Medicine

## 2018-03-26 DIAGNOSIS — M1711 Unilateral primary osteoarthritis, right knee: Secondary | ICD-10-CM

## 2018-03-26 DIAGNOSIS — M1712 Unilateral primary osteoarthritis, left knee: Secondary | ICD-10-CM

## 2018-03-26 MED ORDER — LIDOCAINE HCL (PF) 1 % IJ SOLN
3.0000 mL | INTRAMUSCULAR | Status: AC | PRN
  Administered 2018-03-26: 3 mL

## 2018-03-26 MED ORDER — METHYLPREDNISOLONE ACETATE 40 MG/ML IJ SUSP
40.0000 mg | INTRAMUSCULAR | Status: AC | PRN
  Administered 2018-03-26: 40 mg via INTRA_ARTICULAR

## 2018-03-26 NOTE — Progress Notes (Signed)
   Office Visit Note   Patient: Karen [name]           Date of Birth: 1970-12-03           MRN: [id number] Visit Date: 03/26/2018 Requested by: Glendon Axe, MD [place], [place] [address] PCP: Glendon Axe, MD  Subjective: Chief Complaint  Patient presents with  . Right Knee - Pain  . Left Knee - Pain  . requesting bil cortisone injection/?aspirations    HPI: She is here with bilateral knee pain.  History of osteoarthritis.  Injections give temporary relief.  Her last ones were in May.  She is requesting them again.               ROS: She has carpal tunnel syndrome and is having surgery next week.  She is working on weight loss for morbid obesity.  Other systems were negative.  She is not diabetic.  Objective: Vital Signs: There were no vitals taken for this visit.  Physical Exam:  Knees: 1+ effusion in both knees, no warmth or erythema.  Tender on the medial joint line of each knee.  Imaging: None today.  Assessment & Plan: 1.  Bilateral knee osteoarthritis -Steroid injections in both knees today.  Follow-up as needed.   Follow-Up Instructions: No follow-ups on file.      Procedures: Large Joint Inj on 03/26/2018 10:25 AM Details: 25 G 1.5 in needle, lateral approach  Arthrogram: No  Medications: 3 mL lidocaine (PF) 1 %; 40 mg methylPREDNISolone acetate 40 MG/ML Aspirate: clear and yellow Consent was given by the patient.      No notes on file    PMFS History: Patient Active Problem List   Diagnosis Date Noted  . Menorrhagia 02/22/2015  . Morbid obesity ([place]) 11/10/2012   Past Medical History:  Diagnosis Date  . Anemia   . Arthritis    Left knee  . Headache   . Hypertension     Family History  Problem Relation Age of Onset  . Hyperlipidemia Father   . Heart attack Father   . Heart attack Other   . Hypertension Other   . Cancer Other   . Stroke Other   . Hypertension Mother   . Parkinson's disease Mother     Past  Surgical History:  Procedure Laterality Date  . CHOLECYSTECTOMY    . gastic sleeve  2015  . HYSTEROSCOPY WITH NOVASURE N/A 03/29/2015   Procedure: HYSTEROSCOPY WITH NOVASURE;  Surgeon: Anastasio Auerbach, MD;  Location: [facility];  Service: Gynecology;  Laterality: N/A;  to follow first case around 10:15am.  Requests one hour OR time.  . INTRAUTERINE DEVICE INSERTION     X 2  Spontaneously extruded X 2.   2014/2015  . TUBAL LIGATION  approximately 2000   as an interval procedure historically   Social History   Occupational History  . Not on file  Tobacco Use  . Smoking status: Former Research scientist (life sciences)  . Smokeless tobacco: Never Used  Substance and Sexual Activity  . Alcohol use: No  . Drug use: No  . Sexual activity: Yes    Birth control/protection: Surgical    Comment: BTL

## 2018-03-31 ENCOUNTER — Encounter (HOSPITAL_COMMUNITY): Payer: Self-pay | Admitting: *Deleted

## 2018-03-31 ENCOUNTER — Other Ambulatory Visit: Payer: Self-pay

## 2018-03-31 NOTE — Progress Notes (Signed)
Need orders in epic.  Surgery on 04/04/2018.

## 2018-04-03 ENCOUNTER — Encounter (HOSPITAL_COMMUNITY)
Admission: RE | Admit: 2018-04-03 | Discharge: 2018-04-03 | Disposition: A | Discharge status: Home / Self Care | Payer: BLUE CROSS/BLUE SHIELD | Source: Ambulatory Visit | Attending: Orthopedic Surgery | Admitting: Orthopedic Surgery

## 2018-04-03 ENCOUNTER — Other Ambulatory Visit: Payer: Self-pay

## 2018-04-03 DIAGNOSIS — Z01818 Encounter for other preprocedural examination: Secondary | ICD-10-CM | POA: Diagnosis not present

## 2018-04-03 LAB — COMPREHENSIVE METABOLIC PANEL
ALT: 18 U/L (ref 0–44)
AST: 19 U/L (ref 15–41)
Albumin: 4.1 g/dL (ref 3.5–5.0)
Alkaline Phosphatase: 70 U/L (ref 38–126)
Anion gap: 7 (ref 5–15)
BUN: 10 mg/dL (ref 6–20)
CO2: 28 mmol/L (ref 22–32)
Calcium: 8.9 mg/dL (ref 8.9–10.3)
Chloride: 103 mmol/L (ref 98–111)
Creatinine, Ser: 0.67 mg/dL (ref 0.44–1.00)
GFR calc Af Amer: >60 mL/min (ref >60)
GFR calc non Af Amer: >60 mL/min (ref >60)
Glucose, Bld: 100 mg/dL — ABNORMAL HIGH (ref 70–99)
Potassium: 4.1 mmol/L (ref 3.5–5.1)
Sodium: 138 mmol/L (ref 135–145)
Total Bilirubin: 0.6 mg/dL (ref 0.3–1.2)
Total Protein: 7.5 g/dL (ref 6.5–8.1)

## 2018-04-03 LAB — PROTIME-INR
INR: 1.04
Prothrombin Time: 13.6 seconds (ref 11.4–15.2)

## 2018-04-03 LAB — ABO/RH: ABO/RH(D): O POS

## 2018-04-03 MED ORDER — DEXTROSE 5 % IV SOLN
3.0000 g | INTRAVENOUS | Status: AC
  Administered 2018-04-04: 3 g via INTRAVENOUS
  Filled 2018-04-03: qty 3

## 2018-04-03 NOTE — Patient Instructions (Signed)
[facility] - Preparing for Surgery Before surgery, you can play an important role.  Because skin is not sterile, your skin needs to be as free of germs as possible.  You can reduce the number of germs on your skin by washing with CHG (chlorahexidine gluconate) soap before surgery.  CHG is an antiseptic cleaner which kills germs and bonds with the skin to continue killing germs even after washing. Please DO NOT use if you have an allergy to CHG or antibacterial soaps.  If your skin becomes reddened/irritated stop using the CHG and inform your nurse when you arrive at Short Stay. Do not shave (including legs and underarms) for at least 48 hours prior to the first CHG shower.  You may shave your face/neck. Please follow these instructions carefully:  1.  Shower with CHG Soap the night before surgery and the  morning of Surgery.  2.  If you choose to wash your hair, wash your hair first as usual with your  normal  shampoo.  3.  After you shampoo, rinse your hair and body thoroughly to remove the  shampoo.                           4.  Use CHG as you would any other liquid soap.  You can apply chg directly  to the skin and wash                       Gently with a scrungie or clean washcloth.  5.  Apply the CHG Soap to your body ONLY FROM THE NECK DOWN.   Do not use on face/ open                           Wound or open sores. Avoid contact with eyes, ears mouth and genitals (private parts).                       Wash face,  Genitals (private parts) with your normal soap.             6.  Wash thoroughly, paying special attention to the area where your surgery  will be performed.  7.  Thoroughly rinse your body with warm water from the neck down.  8.  DO NOT shower/wash with your normal soap after using and rinsing off  the CHG Soap.                9.  Pat yourself dry with a clean towel.            10.  Wear clean pajamas.            11.  Place clean sheets on your bed the night of your first shower and  do not  sleep with pets. Day of Surgery : Do not apply any lotions/deodorants the morning of surgery.  Please wear clean clothes to the hospital/surgery center.  FAILURE TO FOLLOW THESE INSTRUCTIONS MAY RESULT IN THE CANCELLATION OF YOUR SURGERY PATIENT SIGNATURE_________________________________  NURSE SIGNATURE__________________________________  ________________________________________________________________________  

## 2018-04-04 ENCOUNTER — Ambulatory Visit (HOSPITAL_COMMUNITY): Payer: BLUE CROSS/BLUE SHIELD | Admitting: Certified Registered"

## 2018-04-04 ENCOUNTER — Ambulatory Visit (HOSPITAL_COMMUNITY)
Admission: RE | Admit: 2018-04-04 | Discharge: 2018-04-04 | Disposition: A | Discharge status: Home / Self Care | Payer: BLUE CROSS/BLUE SHIELD | Attending: Orthopedic Surgery | Admitting: Orthopedic Surgery

## 2018-04-04 ENCOUNTER — Encounter (HOSPITAL_COMMUNITY): Payer: Self-pay

## 2018-04-04 ENCOUNTER — Encounter (HOSPITAL_COMMUNITY)
Admission: RE | Disposition: A | Discharge status: Home / Self Care | Payer: Self-pay | Source: Home / Self Care | Attending: Orthopedic Surgery

## 2018-04-04 DIAGNOSIS — Z87891 Personal history of nicotine dependence: Secondary | ICD-10-CM | POA: Diagnosis not present

## 2018-04-04 DIAGNOSIS — I10 Essential (primary) hypertension: Secondary | ICD-10-CM | POA: Insufficient documentation

## 2018-04-04 DIAGNOSIS — G5601 Carpal tunnel syndrome, right upper limb: Secondary | ICD-10-CM

## 2018-04-04 DIAGNOSIS — Z6841 Body Mass Index (BMI) 40.0 and over, adult: Secondary | ICD-10-CM | POA: Insufficient documentation

## 2018-04-04 DIAGNOSIS — Z886 Allergy status to analgesic agent status: Secondary | ICD-10-CM | POA: Insufficient documentation

## 2018-04-04 DIAGNOSIS — Z79899 Other long term (current) drug therapy: Secondary | ICD-10-CM | POA: Insufficient documentation

## 2018-04-04 HISTORY — DX: Other specified postprocedural states: Z98.890

## 2018-04-04 HISTORY — DX: Nausea with vomiting, unspecified: R11.2

## 2018-04-04 HISTORY — PX: CARPAL TUNNEL RELEASE: SHX101

## 2018-04-04 LAB — CBC WITH DIFFERENTIAL/PLATELET
Abs Immature Granulocytes: 0.03 10*3/uL (ref 0.00–0.07)
Basophils Absolute: 0 10*3/uL (ref 0.0–0.1)
Basophils Relative: 1 %
Eosinophils Absolute: 0.3 10*3/uL (ref 0.0–0.5)
Eosinophils Relative: 3 %
HCT: 43.5 % (ref 36.0–46.0)
Hemoglobin: 13.3 g/dL (ref 12.0–15.0)
IMMATURE GRANULOCYTES: 0 %
Lymphocytes Relative: 22 %
Lymphs Abs: 1.9 10*3/uL (ref 0.7–4.0)
MCH: 27.8 pg (ref 26.0–34.0)
MCHC: 30.6 g/dL (ref 30.0–36.0)
MCV: 90.8 fL (ref 80.0–100.0)
MONOS PCT: 11 %
Monocytes Absolute: 1 10*3/uL (ref 0.1–1.0)
Neutro Abs: 5.3 10*3/uL (ref 1.7–7.7)
Neutrophils Relative %: 63 %
Platelets: 253 10*3/uL (ref 150–400)
RBC: 4.79 MIL/uL (ref 3.87–5.11)
RDW: 14 % (ref 11.5–15.5)
WBC: 8.5 10*3/uL (ref 4.0–10.5)
nRBC: 0 % (ref 0.0–0.2)

## 2018-04-04 LAB — TYPE AND SCREEN
ABO/RH(D): O POS
Antibody Screen: NEGATIVE

## 2018-04-04 SURGERY — CARPAL TUNNEL RELEASE
Anesthesia: General | Site: Wrist | Laterality: Right

## 2018-04-04 MED ORDER — MIDAZOLAM HCL 2 MG/2ML IJ SOLN
INTRAMUSCULAR | Status: AC
  Filled 2018-04-04: qty 2

## 2018-04-04 MED ORDER — EPHEDRINE SULFATE 50 MG/ML IJ SOLN
INTRAMUSCULAR | Status: DC | PRN
  Administered 2018-04-04 (×2): 10 mg via INTRAVENOUS

## 2018-04-04 MED ORDER — GLYCOPYRROLATE PF 0.2 MG/ML IJ SOSY
PREFILLED_SYRINGE | INTRAMUSCULAR | Status: AC
  Filled 2018-04-04: qty 1

## 2018-04-04 MED ORDER — FENTANYL CITRATE (PF) 100 MCG/2ML IJ SOLN
INTRAMUSCULAR | Status: AC
  Filled 2018-04-04: qty 2

## 2018-04-04 MED ORDER — ONDANSETRON HCL 4 MG/2ML IJ SOLN: 4.0000 mg | Freq: Once | INTRAMUSCULAR | Status: DC | PRN

## 2018-04-04 MED ORDER — VASOPRESSIN 20 UNIT/ML IV SOLN
INTRAVENOUS | Status: AC
  Filled 2018-04-04: qty 1

## 2018-04-04 MED ORDER — BACITRACIN-NEOMYCIN-POLYMYXIN 400-5-5000 EX OINT
TOPICAL_OINTMENT | CUTANEOUS | Status: DC | PRN
  Administered 2018-04-04: 1 via TOPICAL

## 2018-04-04 MED ORDER — FENTANYL CITRATE (PF) 100 MCG/2ML IJ SOLN
INTRAMUSCULAR | Status: DC | PRN
  Administered 2018-04-04: 50 ug via INTRAVENOUS

## 2018-04-04 MED ORDER — PROPOFOL 10 MG/ML IV BOLUS
INTRAVENOUS | Status: AC
  Filled 2018-04-04: qty 60

## 2018-04-04 MED ORDER — SODIUM CHLORIDE 0.9 % IR SOLN
Status: DC | PRN
  Administered 2018-04-04: 1000 mL

## 2018-04-04 MED ORDER — ONDANSETRON HCL 4 MG/2ML IJ SOLN
INTRAMUSCULAR | Status: DC | PRN
  Administered 2018-04-04: 4 mg via INTRAVENOUS

## 2018-04-04 MED ORDER — BACITRACIN ZINC 500 UNIT/GM EX OINT
TOPICAL_OINTMENT | CUTANEOUS | Status: AC
  Filled 2018-04-04: qty 28.35

## 2018-04-04 MED ORDER — PHENYLEPHRINE HCL 10 MG/ML IJ SOLN
INTRAMUSCULAR | Status: DC | PRN
  Administered 2018-04-04 (×11): 80 ug via INTRAVENOUS

## 2018-04-04 MED ORDER — PROPOFOL 10 MG/ML IV BOLUS
INTRAVENOUS | Status: DC | PRN
  Administered 2018-04-04: 100 mg via INTRAVENOUS
  Administered 2018-04-04: 200 mg via INTRAVENOUS

## 2018-04-04 MED ORDER — LIDOCAINE 2% (20 MG/ML) 5 ML SYRINGE
INTRAMUSCULAR | Status: AC
  Filled 2018-04-04: qty 5

## 2018-04-04 MED ORDER — SODIUM CHLORIDE 0.9 % IV SOLN
INTRAVENOUS | Status: DC | PRN
  Administered 2018-04-04: 15 ug/min via INTRAVENOUS

## 2018-04-04 MED ORDER — CHLORHEXIDINE GLUCONATE 4 % EX LIQD: 60.0000 mL | Freq: Once | CUTANEOUS | Status: DC

## 2018-04-04 MED ORDER — FENTANYL CITRATE (PF) 100 MCG/2ML IJ SOLN
25.0000 ug | INTRAMUSCULAR | Status: DC | PRN
  Administered 2018-04-04 (×3): 50 ug via INTRAVENOUS

## 2018-04-04 MED ORDER — GLYCOPYRROLATE 0.2 MG/ML IJ SOLN
INTRAMUSCULAR | Status: DC | PRN
  Administered 2018-04-04: 0.1 mg via INTRAVENOUS

## 2018-04-04 MED ORDER — LACTATED RINGERS IV SOLN
INTRAVENOUS | Status: DC
  Administered 2018-04-04 (×2): via INTRAVENOUS

## 2018-04-04 MED ORDER — LIDOCAINE 2% (20 MG/ML) 5 ML SYRINGE
INTRAMUSCULAR | Status: DC | PRN
  Administered 2018-04-04: 60 mg via INTRAVENOUS

## 2018-04-04 MED ORDER — MIDAZOLAM HCL 5 MG/5ML IJ SOLN
INTRAMUSCULAR | Status: DC | PRN
  Administered 2018-04-04: 0.5 mg via INTRAVENOUS

## 2018-04-04 MED ORDER — DEXAMETHASONE SODIUM PHOSPHATE 4 MG/ML IJ SOLN
INTRAMUSCULAR | Status: DC | PRN
  Administered 2018-04-04: 4 mg via INTRAVENOUS

## 2018-04-04 SURGICAL SUPPLY — 43 items
BAG SPEC THK2 15X12 ZIP CLS (MISCELLANEOUS) ×1
BAG ZIPLOCK 12X15 (MISCELLANEOUS) ×2 IMPLANT
BANDAGE ACE 3X5.8 VEL STRL LF (GAUZE/BANDAGES/DRESSINGS) ×2 IMPLANT
BLADE SURG 15 STRL LF DISP TIS (BLADE) ×1 IMPLANT
BLADE SURG 15 STRL SS (BLADE) ×2
BNDG CMPR 9X4 STRL LF SNTH (GAUZE/BANDAGES/DRESSINGS) ×1
BNDG CONFORM 3 STRL LF (GAUZE/BANDAGES/DRESSINGS) ×1 IMPLANT
BNDG ESMARK 4X9 LF (GAUZE/BANDAGES/DRESSINGS) ×1 IMPLANT
BNDG GAUZE ELAST 4 BULKY (GAUZE/BANDAGES/DRESSINGS) ×1 IMPLANT
CORD BIPOLAR FORCEPS 12FT (ELECTRODE) ×1 IMPLANT
COVER SURGICAL LIGHT HANDLE (MISCELLANEOUS) ×2 IMPLANT
COVER WAND RF STERILE (DRAPES) ×1 IMPLANT
CUFF TOURN SGL QUICK 18 (TOURNIQUET CUFF) ×1 IMPLANT
CUFF TOURN SGL QUICK 24 (TOURNIQUET CUFF) ×2
CUFF TRNQT CYL 24X4X40X1 (TOURNIQUET CUFF) IMPLANT
DECANTER SPIKE VIAL GLASS SM (MISCELLANEOUS) ×1 IMPLANT
DRSG EMULSION OIL 3X3 NADH (GAUZE/BANDAGES/DRESSINGS) ×2 IMPLANT
DURAPREP 26ML APPLICATOR (WOUND CARE) ×2 IMPLANT
ELECT REM PT RETURN 15FT ADLT (MISCELLANEOUS) ×2 IMPLANT
GAUZE 4X4 16PLY RFD (DISPOSABLE) IMPLANT
GAUZE SPONGE 4X4 12PLY STRL (GAUZE/BANDAGES/DRESSINGS) ×2 IMPLANT
GLOVE BIO SURGEON STRL SZ7.5 (GLOVE) ×1 IMPLANT
GLOVE BIOGEL PI IND STRL 6.5 (GLOVE) IMPLANT
GLOVE BIOGEL PI IND STRL 7.0 (GLOVE) IMPLANT
GLOVE BIOGEL PI IND STRL 8.5 (GLOVE) ×1 IMPLANT
GLOVE BIOGEL PI INDICATOR 6.5 (GLOVE) ×1
GLOVE BIOGEL PI INDICATOR 7.0 (GLOVE) ×1
GLOVE BIOGEL PI INDICATOR 8.5 (GLOVE) ×1
GLOVE ECLIPSE 8.0 STRL XLNG CF (GLOVE) ×4 IMPLANT
GOWN STRL REUS W/TWL LRG LVL3 (GOWN DISPOSABLE) ×3 IMPLANT
GOWN STRL REUS W/TWL XL LVL3 (GOWN DISPOSABLE) ×2 IMPLANT
KIT BASIN OR (CUSTOM PROCEDURE TRAY) ×2 IMPLANT
MANIFOLD NEPTUNE II (INSTRUMENTS) ×2 IMPLANT
NS IRRIG 1000ML POUR BTL (IV SOLUTION) ×2 IMPLANT
PACK ORTHO EXTREMITY (CUSTOM PROCEDURE TRAY) ×2 IMPLANT
PAD ABD 8X10 STRL (GAUZE/BANDAGES/DRESSINGS) ×1 IMPLANT
PAD CAST 3X4 CTTN HI CHSV (CAST SUPPLIES) ×1 IMPLANT
PADDING CAST COTTON 3X4 STRL (CAST SUPPLIES)
PROTECTOR NERVE ULNAR (MISCELLANEOUS) ×2 IMPLANT
SUT ETHILON 3 0 PS 1 (SUTURE) ×2 IMPLANT
SYR CONTROL 10ML LL (SYRINGE) ×1 IMPLANT
TOWEL OR 17X26 10 PK STRL BLUE (TOWEL DISPOSABLE) ×3 IMPLANT
WATER STERILE IRR 1000ML POUR (IV SOLUTION) ×2 IMPLANT

## 2018-04-04 NOTE — Anesthesia Preprocedure Evaluation (Signed)
Anesthesia Evaluation  Patient identified by MRN, date of birth, ID band Patient awake    Reviewed: Allergy & Precautions, NPO status , Patient's Chart, lab work & pertinent test results  Airway Mallampati: II  TM Distance: >3 FB Neck ROM: Full    Dental  (+) Dental Advisory Given   Pulmonary former smoker,    breath sounds clear to auscultation       Cardiovascular hypertension, Pt. on medications  Rhythm:Regular Rate:Normal     Neuro/Psych negative neurological ROS     GI/Hepatic negative GI ROS, Neg liver ROS,   Endo/Other  Morbid obesity  Renal/GU negative Renal ROS     Musculoskeletal  (+) Arthritis ,   Abdominal   Peds  Hematology negative hematology ROS (+)   Anesthesia Other Findings   Reproductive/Obstetrics                             Lab Results  Component Value Date   WBC 11.7 (H) 08/01/2016   HGB 13.9 08/01/2016   HCT 41.7 08/01/2016   MCV 84.4 08/01/2016   PLT 314 08/01/2016   Lab Results  Component Value Date   CREATININE 0.67 04/03/2018   BUN 10 04/03/2018   NA 138 04/03/2018   K 4.1 04/03/2018   CL 103 04/03/2018   CO2 28 04/03/2018    Anesthesia Physical Anesthesia Plan  ASA: III  Anesthesia Plan: General   Post-op Pain Management:    Induction: Intravenous  PONV Risk Score and Plan: 3 and Dexamethasone, Ondansetron and Treatment may vary due to age or medical condition  Airway Management Planned: LMA and Oral ETT  Additional Equipment:   Intra-op Plan:   Post-operative Plan: Extubation in OR  Informed Consent: I have reviewed the patients History and Physical, chart, labs and discussed the procedure including the risks, benefits and alternatives for the proposed anesthesia with the patient or authorized representative who has indicated his/her understanding and acceptance.   Dental advisory given  Plan Discussed with: CRNA  Anesthesia  Plan Comments:         Anesthesia Quick Evaluation

## 2018-04-04 NOTE — Interval H&P Note (Signed)
History and Physical Interval Note:  04/04/2018 3:04 PM  Karen [name]  has presented today for surgery, with the diagnosis of right carpal tunnel syndrome  The various methods of treatment have been discussed with the patient and family. After consideration of risks, benefits and other options for treatment, the patient has consented to  Procedure(s) with comments: CARPAL TUNNEL RELEASE (Right) - 72min as a surgical intervention .  The patient's history has been reviewed, patient examined, no change in status, stable for surgery.  I have reviewed the patient's chart and labs.  Questions were answered to the patient's satisfaction.     Latanya Maudlin

## 2018-04-04 NOTE — Anesthesia Procedure Notes (Signed)
Procedure Name: LMA Insertion Date/Time: 04/04/2018 3:55 PM Performed by: Adalberto Ill, CRNA Pre-anesthesia Checklist: Patient identified, Emergency Drugs available, Suction available, Timeout performed and Patient being monitored Patient Re-evaluated:Patient Re-evaluated prior to induction Oxygen Delivery Method: Circle system utilized Preoxygenation: Pre-oxygenation with 100% oxygen Induction Type: IV induction Ventilation: Mask ventilation without difficulty LMA: LMA inserted LMA Size: 5.0 Number of attempts: 1 (brief atraumatic lubricated with ky jelly before placed ) Placement Confirmation: positive ETCO2 and breath sounds checked- equal and bilateral ETT to lip (cm): yes. Tube secured with: Tape Dental Injury: Teeth and Oropharynx as per pre-operative assessment

## 2018-04-04 NOTE — Discharge Instructions (Signed)
Office Monday at 9:oo A.M.Keep dressing Dry

## 2018-04-04 NOTE — Op Note (Signed)
NAME: Karen [name], Karen [name] MEDICAL RECORD PY:[id number] ACCOUNT [id number] DATE OF BIRTH:1970-06-22 FACILITY: [facility] LOCATION: [facility] PHYSICIAN:Cecilie Heidel Fransico Setters, MD  OPERATIVE REPORT  DATE OF PROCEDURE:  04/04/2018  SURGEON:  Latanya Maudlin, MD  ASSISTANT:  OR nurse.  PREOPERATIVE DIAGNOSES:  Severe carpal tunnel syndrome on the right.  POSTOPERATIVE DIAGNOSES:  Severe carpal tunnel syndrome on the right.  OPERATION:  Right carpal tunnel release.  DESCRIPTION OF PROCEDURE:  Under general anesthesia, routine orthopedic prep and draping were carried out in the right upper extremity.  The tourniquet was elevated at 250 mmHg.  Total tourniquet time was 14 minutes.  At this time, an incision was made  over the palmar aspect of the right hand with great care taken not to injure any underlying neurovascular structures.  I then inserted a self-retaining retractor.  A small incision was made in the deep and superficial transverse carpal ligaments.  A  Valora Corporal was entered under the ligament, and then I carefully incised the ligament all the way down into the palm and proximally until the nerve was totally decompressed.  The nerve was visualized at all times.  I thoroughly irrigated out the area and  closed the skin only with 3-0 nylon suture.  A sterile Neosporin bundle dressing was applied, and the patient left the operating room in satisfactory condition.  She had 3 g of IV Ancef preop.  The patient will be seen in the office Monday for a dressing  change or prior to that if there is any problem.  LN/NUANCE  D:04/04/2018 T:04/04/2018 JOB:[id number]

## 2018-04-04 NOTE — H&P (Signed)
Karen [name] is an 47 y.o. female.   Chief Complaint: Pain in her Right Hand and numbness. HPI: She has had progressive pain in her Right hand especially at night.  Past Medical History:  Diagnosis Date  . Arthritis    Left knee  . Hypertension   . PONV (postoperative nausea and vomiting)     Past Surgical History:  Procedure Laterality Date  . CHOLECYSTECTOMY    . gastic sleeve  2015  . HYSTEROSCOPY WITH NOVASURE N/A 03/29/2015   Procedure: HYSTEROSCOPY WITH NOVASURE;  Surgeon: Anastasio Auerbach, MD;  Location: [facility];  Service: Gynecology;  Laterality: N/A;  to follow first case around 10:15am.  Requests one hour OR time.  . INTRAUTERINE DEVICE INSERTION     X 2  Spontaneously extruded X 2.   2014/2015  . TUBAL LIGATION  approximately 2000   as an interval procedure historically    Family History  Problem Relation Age of Onset  . Hyperlipidemia Father   . Heart attack Father   . Heart attack Other   . Hypertension Other   . Cancer Other   . Stroke Other   . Hypertension Mother   . Parkinson's disease Mother    Social History:  reports that she has quit smoking. She has never used smokeless tobacco. She reports current alcohol use. She reports that she does not use drugs.  Allergies:  Allergies  Allergen Reactions  . Nsaids     History of gastric sleeve  . Other Other (See Comments)    Patient stated that she can only take chewable and liquid medication    Medications Prior to Admission  Medication Sig Dispense Refill  . amLODipine (NORVASC) 5 MG tablet Take 5 mg by mouth daily.     Marland Kitchen b complex vitamins tablet Take 1 tablet by mouth daily.    . Calcium Carbonate-Vitamin D (CALCIUM-VITAMIN D) 600-125 MG-UNIT TABS Take 1 tablet by mouth daily.    . Cholecalciferol (VITAMIN D-3) 25 MCG (1000 UT) CAPS Take 2,000 Units by mouth daily.    . IRON PO Take 1 tablet by mouth daily.    . Multiple Vitamin (MULTIVITAMIN WITH MINERALS) TABS tablet Take 1 tablet by  mouth daily.    . Multiple Vitamins-Minerals (HAIR/SKIN/NAILS PO) Take 1 tablet by mouth daily.    . Omega-3 Fatty Acids (FISH OIL PO) Take 1 capsule by mouth daily.    . vitamin B-12 (CYANOCOBALAMIN) 1000 MCG tablet Take 5,000 mcg by mouth daily.      Results for orders placed or performed during the hospital encounter of 04/03/18 (from the past 48 hour(s))  ABO/Rh     Status: None   Collection Time: 04/03/18 11:37 AM  Result Value Ref Range   ABO/RH(D)      O POS Performed at [facility], [place] [address] [place], [place] [address]   Comprehensive metabolic panel     Status: Abnormal   Collection Time: 04/03/18 11:42 AM  Result Value Ref Range   Sodium 138 135 - 145 mmol/L   Potassium 4.1 3.5 - 5.1 mmol/L   Chloride 103 98 - 111 mmol/L   CO2 28 22 - 32 mmol/L   Glucose, Bld 100 (H) 70 - 99 mg/dL   BUN 10 6 - 20 mg/dL   Creatinine, Ser 0.67 0.44 - 1.00 mg/dL   Calcium 8.9 8.9 - 10.3 mg/dL   Total Protein 7.5 6.5 - 8.1 g/dL   Albumin 4.1 3.5 - 5.0  g/dL   AST 19 15 - 41 U/L   ALT 18 0 - 44 U/L   Alkaline Phosphatase 70 38 - 126 U/L   Total Bilirubin 0.6 0.3 - 1.2 mg/dL   GFR calc non Af Amer >60 >60 mL/min   GFR calc Af Amer >60 >60 mL/min   Anion gap 7 5 - 15    Comment: Performed at [facility], [place] [address] [place], [place] [address] [address]  Protime-INR     Status: None   Collection Time: 04/03/18 11:42 AM  Result Value Ref Range   Prothrombin Time 13.6 11.4 - 15.2 seconds   INR 1.04     Comment: Performed at [facility], [place] [address], [place], [place] [address]  Type and screen Order type and screen if day of surgery is less than 15 days from draw of preadmission visit or order morning of surgery if day of surgery is greater than 6 days from preadmission visit.     Status: None   Collection Time: 04/03/18 11:42 AM  Result Value Ref Range   ABO/RH(D) O POS    Antibody Screen NEG    Sample Expiration 04/07/2018     Extend sample reason      NO TRANSFUSIONS OR PREGNANCY IN THE PAST 3 MONTHS Performed at [facility], [place] [address], [place], [place] [address]    No results found.  Review of Systems  Constitutional: Negative.   HENT: Negative.   Eyes: Negative.   Respiratory: Negative.   Cardiovascular: Negative.   Gastrointestinal: Negative.   Genitourinary: Negative.   Musculoskeletal: Negative.   Skin: Negative.   Neurological: Positive for tingling, sensory change and weakness.  Endo/Heme/Allergies: Negative.   Psychiatric/Behavioral: Negative.     Blood pressure 127/83, pulse 94, temperature 99.3 F (37.4 C), temperature source Oral, resp. rate 16, height 5\' 3"  (1.6 m), weight (!) 145.2 kg, SpO2 98 %. Physical Exam  Constitutional: She appears well-developed.  HENT:  Head: Normocephalic.  Eyes: Pupils are equal, round, and reactive to light.  Neck: Normal range of motion.  Cardiovascular: Normal rate.  Respiratory: Effort normal.  GI: Soft.  Musculoskeletal:        General: Tenderness present.  Neurological:  Numbness in her Right thumb and index and middle fingers.pain at night in her Right Hand.  Skin: Skin is warm.  Psychiatric: She has a normal mood and affect.     Assessment/Plan Right Carpal Tunnel Release.  Latanya Maudlin, MD 04/04/2018, 2:59 PM

## 2018-04-04 NOTE — Brief Op Note (Signed)
04/04/2018  4:37 PM  PATIENT:  Karen [name]  47 y.o. female  PRE-OPERATIVE DIAGNOSIS:  right carpal tunnel syndrome  POST-OPERATIVE DIAGNOSIS:  right carpal tunnel syndrome  PROCEDURE:  Procedure(s) with comments: CARPAL TUNNEL RELEASE (Right) - 51min  SURGEON:  Surgeon(s) and Role:    * Latanya Maudlin, MD - Primary  PHYSICIAN ASSISTANT: OR Tech  ASSISTANTS: OR Tech   ANESTHESIA:   general  EBL:  0 mL   BLOOD ADMINISTERED:none  DRAINS: none   LOCAL MEDICATIONS USED:  NONE  SPECIMEN:  No Specimen  DISPOSITION OF SPECIMEN:  N/A  COUNTS:  YES  TOURNIQUET:   Total Tourniquet Time Documented: Upper Arm (Right) - 14 minutes Total: Upper Arm (Right) - 14 minutes   DICTATION: .Other Dictation: Dictation Number [id number]  PLAN OF CARE: Discharge to home after PACU  PATIENT DISPOSITION:  PACU - hemodynamically stable.   Delay start of Pharmacological VTE agent (>24hrs) due to surgical blood loss or risk of bleeding: yes

## 2018-04-04 NOTE — Anesthesia Postprocedure Evaluation (Signed)
Anesthesia Post Note  Patient: Juri J Bradt  Procedure(s) Performed: CARPAL TUNNEL RELEASE (Right Wrist)     Patient location during evaluation: PACU Anesthesia Type: General Level of consciousness: awake and alert Pain management: pain level controlled Vital Signs Assessment: post-procedure vital signs reviewed and stable Respiratory status: spontaneous breathing, nonlabored ventilation, respiratory function stable and patient connected to nasal cannula oxygen Cardiovascular status: blood pressure returned to baseline and stable Postop Assessment: no apparent nausea or vomiting Anesthetic complications: no    Last Vitals:  Vitals:   04/04/18 1715 04/04/18 1730  BP: 121/71 (!) 146/80  Pulse: 93 93  Resp: 11 14  Temp: 36.7 C 36.6 C  SpO2: 94% 94%    Last Pain:  Vitals:   04/04/18 1730  TempSrc: Oral  PainSc: 3                  Tiajuana Amass

## 2018-04-04 NOTE — Transfer of Care (Signed)
Immediate Anesthesia Transfer of Care Note  Patient: Karen [name]  Procedure(s) Performed: CARPAL TUNNEL RELEASE (Right Wrist)  Patient Location: PACU  Anesthesia Type:General  Level of Consciousness: awake, alert , oriented and patient cooperative  Airway & Oxygen Therapy: Patient Spontanous Breathing and Patient connected to face mask oxygen  Post-op Assessment: Report given to RN and Post -op Vital signs reviewed and stable  Post vital signs: Reviewed and stable  Last Vitals:  Vitals Value Taken Time  BP 111/78 04/04/2018  4:47 PM  Temp    Pulse 115 04/04/2018  4:50 PM  Resp 15 04/04/2018  4:50 PM  SpO2 98 % 04/04/2018  4:50 PM  Vitals shown include unvalidated device data.  Last Pain:  Vitals:   04/04/18 1212  TempSrc: Oral         Complications: No apparent anesthesia complications

## 2018-04-05 ENCOUNTER — Encounter (HOSPITAL_COMMUNITY): Payer: Self-pay | Admitting: Orthopedic Surgery

## 2018-06-25 ENCOUNTER — Encounter (INDEPENDENT_AMBULATORY_CARE_PROVIDER_SITE_OTHER): Payer: Self-pay | Admitting: Family Medicine

## 2018-06-25 ENCOUNTER — Ambulatory Visit (INDEPENDENT_AMBULATORY_CARE_PROVIDER_SITE_OTHER): Payer: 59 | Admitting: Family Medicine

## 2018-06-25 ENCOUNTER — Other Ambulatory Visit: Payer: Self-pay

## 2018-06-25 DIAGNOSIS — K8020 Calculus of gallbladder without cholecystitis without obstruction: Secondary | ICD-10-CM | POA: Insufficient documentation

## 2018-06-25 DIAGNOSIS — R1011 Right upper quadrant pain: Secondary | ICD-10-CM

## 2018-06-25 DIAGNOSIS — M1711 Unilateral primary osteoarthritis, right knee: Secondary | ICD-10-CM | POA: Diagnosis not present

## 2018-06-25 DIAGNOSIS — M1712 Unilateral primary osteoarthritis, left knee: Secondary | ICD-10-CM | POA: Diagnosis not present

## 2018-06-25 DIAGNOSIS — K760 Fatty (change of) liver, not elsewhere classified: Secondary | ICD-10-CM | POA: Insufficient documentation

## 2018-06-25 DIAGNOSIS — R110 Nausea: Secondary | ICD-10-CM | POA: Insufficient documentation

## 2018-06-25 HISTORY — DX: Calculus of gallbladder without cholecystitis without obstruction: K80.20

## 2018-06-25 HISTORY — DX: Right upper quadrant pain: R10.11

## 2018-06-25 HISTORY — DX: Fatty (change of) liver, not elsewhere classified: K76.0

## 2018-06-25 MED ORDER — METHYLPREDNISOLONE ACETATE 40 MG/ML IJ SUSP: 40.0000 mg | Freq: Once | INTRAMUSCULAR | Status: DC

## 2018-06-25 NOTE — Progress Notes (Signed)
Office Visit Note   Patient: Karen [name]           Date of Birth: 29-Apr-1970           MRN: [id number] Visit Date: 06/25/2018 Requested by: Glendon Axe, MD [place], [place] [address] PCP: Glendon Axe, MD  Subjective: Chief Complaint  Patient presents with  . Right Knee - Pain  . Left Knee - Pain    HPI: She is here with recurrent bilateral knee pain.  Injections in December gave her relief until about a week ago.  She would like to have them again.  In the past she had gel injections which did not help as much.              ROS: Fevers or chills.  All other systems were reviewed and are negative.  Objective: Vital Signs: There were no vitals taken for this visit.  Physical Exam:  General:  Alert and oriented, in no acute distress. Pulm:  Breathing unlabored. Psy:  Normal mood, congruent affect. Skin: No rash on her skin. Knees: No significant effusion in either knee today.  Trace patellofemoral crepitus.  Full range of motion bilaterally.  Tender on the medial and lateral joint lines.  Imaging: None today.  Assessment & Plan: 1.  Bilateral knee osteoarthritis -Steroid injections today, follow-up as needed.     Procedures: Bilateral steroid injections knees: After sterile prep with Betadine, injected 3 cc 1% lidocaine without epinephrine and 40 mg methylprednisolone from lateral midpatellar approach into each knee without complication.    PMFS History: Patient Active Problem List   Diagnosis Date Noted  . Gallstone 06/25/2018  . Nausea 06/25/2018  . Right upper quadrant pain 06/25/2018  . Steatosis of liver 06/25/2018  . Carpal tunnel syndrome of right wrist 02/10/2018  . Pain in right hand 01/27/2018  . Menorrhagia 02/22/2015  . Morbid obesity ([place]) 11/10/2012   Past Medical History:  Diagnosis Date  . Arthritis    Left knee  . Hypertension   . PONV (postoperative nausea and vomiting)     Family History  Problem Relation Age of  Onset  . Hyperlipidemia Father   . Heart attack Father   . Heart attack Other   . Hypertension Other   . Cancer Other   . Stroke Other   . Hypertension Mother   . Parkinson's disease Mother     Past Surgical History:  Procedure Laterality Date  . CARPAL TUNNEL RELEASE Right 04/04/2018   Procedure: CARPAL TUNNEL RELEASE;  Surgeon: Latanya Maudlin, MD;  Location: [facility];  Service: Orthopedics;  Laterality: Right;  68min  . CHOLECYSTECTOMY    . gastic sleeve  2015  . HYSTEROSCOPY WITH NOVASURE N/A 03/29/2015   Procedure: HYSTEROSCOPY WITH NOVASURE;  Surgeon: Anastasio Auerbach, MD;  Location: [facility];  Service: Gynecology;  Laterality: N/A;  to follow first case around 10:15am.  Requests one hour OR time.  . INTRAUTERINE DEVICE INSERTION     X 2  Spontaneously extruded X 2.   2014/2015  . TUBAL LIGATION  approximately 2000   as an interval procedure historically   Social History   Occupational History  . Not on file  Tobacco Use  . Smoking status: Former Research scientist (life sciences)  . Smokeless tobacco: Never Used  Substance and Sexual Activity  . Alcohol use: Yes    Comment: occas   . Drug use: No  . Sexual activity: Yes    Birth control/protection: Surgical  Comment: BTL

## 2018-09-26 ENCOUNTER — Ambulatory Visit (INDEPENDENT_AMBULATORY_CARE_PROVIDER_SITE_OTHER): Payer: 59 | Admitting: Family Medicine

## 2018-09-26 ENCOUNTER — Other Ambulatory Visit: Payer: Self-pay

## 2018-09-26 ENCOUNTER — Encounter: Payer: Self-pay | Admitting: Family Medicine

## 2018-09-26 DIAGNOSIS — M1712 Unilateral primary osteoarthritis, left knee: Secondary | ICD-10-CM

## 2018-09-26 DIAGNOSIS — M1711 Unilateral primary osteoarthritis, right knee: Secondary | ICD-10-CM

## 2018-09-26 NOTE — Progress Notes (Signed)
   Office Visit Note   Patient: Karen [name]           Date of Birth: 03-08-1971           MRN: [id number] Visit Date: 09/26/2018 Requested by: Glendon Axe, MD [place],  [place] [address] PCP: Glendon Axe, MD  Subjective: No chief complaint on file.   HPI: She is here with recurrent bilateral knee pain.  Injections only helped about 2 months.  She would like to try it again.              ROS: No fevers or chills.  All other systems were reviewed and are negative.  Objective: Vital Signs: There were no vitals taken for this visit.  Physical Exam:  General:  Alert and oriented, in no acute distress. Pulm:  Breathing unlabored. Psy:  Normal mood, congruent affect. Skin: No rash or erythema. Knees: Trace effusion in both knees, tender on the medial and lateral joint lines of both.  Imaging: None today.  Assessment & Plan: 1.  Bilateral knee osteoarthritis -Repeat injections today.  If she only gets short-term relief, we might do them ultrasound-guided next time.     Procedures: Bilateral knee steroid injections: After sterile prep with Betadine, injected 5 cc 1% lidocaine without epinephrine and 40 mg methylprednisolone from lateral midpatellar approach into each knee.  Flash of synovial fluid obtained prior to the left knee injection.    PMFS History: Patient Active Problem List   Diagnosis Date Noted  . Gallstone 06/25/2018  . Nausea 06/25/2018  . Right upper quadrant pain 06/25/2018  . Steatosis of liver 06/25/2018  . Carpal tunnel syndrome of right wrist 02/10/2018  . Pain in right hand 01/27/2018  . Menorrhagia 02/22/2015  . Morbid obesity ([place]) 11/10/2012   Past Medical History:  Diagnosis Date  . Arthritis    Left knee  . Hypertension   . PONV (postoperative nausea and vomiting)     Family History  Problem Relation Age of Onset  . Hyperlipidemia Father   . Heart attack Father   . Heart attack Other   . Hypertension Other   .  Cancer Other   . Stroke Other   . Hypertension Mother   . Parkinson's disease Mother     Past Surgical History:  Procedure Laterality Date  . CARPAL TUNNEL RELEASE Right 04/04/2018   Procedure: CARPAL TUNNEL RELEASE;  Surgeon: Latanya Maudlin, MD;  Location: [facility];  Service: Orthopedics;  Laterality: Right;  37min  . CHOLECYSTECTOMY    . gastic sleeve  2015  . HYSTEROSCOPY WITH NOVASURE N/A 03/29/2015   Procedure: HYSTEROSCOPY WITH NOVASURE;  Surgeon: Anastasio Auerbach, MD;  Location: [facility];  Service: Gynecology;  Laterality: N/A;  to follow first case around 10:15am.  Requests one hour OR time.  . INTRAUTERINE DEVICE INSERTION     X 2  Spontaneously extruded X 2.   2014/2015  . TUBAL LIGATION  approximately 2000   as an interval procedure historically   Social History   Occupational History  . Not on file  Tobacco Use  . Smoking status: Former Research scientist (life sciences)  . Smokeless tobacco: Never Used  Substance and Sexual Activity  . Alcohol use: Yes    Comment: occas   . Drug use: No  . Sexual activity: Yes    Birth control/protection: Surgical    Comment: BTL

## 2018-12-31 ENCOUNTER — Encounter: Payer: Self-pay | Admitting: Gynecology

## 2019-02-23 ENCOUNTER — Telehealth: Payer: Self-pay | Admitting: Orthopedic Surgery

## 2019-02-23 NOTE — Telephone Encounter (Signed)
Patient called left vm wanting appt. Called patient back and n/a.

## 2019-02-26 ENCOUNTER — Other Ambulatory Visit: Payer: Self-pay

## 2019-02-26 ENCOUNTER — Telehealth: Payer: Self-pay | Admitting: Radiology

## 2019-02-26 ENCOUNTER — Telehealth: Payer: Self-pay

## 2019-02-26 ENCOUNTER — Ambulatory Visit (INDEPENDENT_AMBULATORY_CARE_PROVIDER_SITE_OTHER): Payer: 59 | Admitting: Family Medicine

## 2019-02-26 ENCOUNTER — Encounter: Payer: Self-pay | Admitting: Family Medicine

## 2019-02-26 DIAGNOSIS — M1711 Unilateral primary osteoarthritis, right knee: Secondary | ICD-10-CM | POA: Diagnosis not present

## 2019-02-26 DIAGNOSIS — M1712 Unilateral primary osteoarthritis, left knee: Secondary | ICD-10-CM | POA: Diagnosis not present

## 2019-02-26 NOTE — Telephone Encounter (Signed)
Bilateral knee gel injections (bilateral knee OA) per Dr. Junius Roads. [employer]

## 2019-02-26 NOTE — Progress Notes (Signed)
Office Visit Note   Patient: Karen [name]           Date of Birth: 07-25-1970           MRN: [id number] Visit Date: 02/26/2019 Requested by: Glendon Axe, MD [place],  [place] [address] PCP: Glendon Axe, MD  Subjective: Chief Complaint  Patient presents with  . Right Knee - Pain    Bilateral knee cortisone injections from 09/26/18 lasted until sometime in September. Would like repeat injections today.  . Left Knee - Pain    HPI: She is here with recurrent bilateral knee pain.  Last injections helped for about 3 months, she has been hurting for the past 2 months.              ROS: No fever or chills.  All other systems were reviewed and are negative.  Objective: Vital Signs: There were no vitals taken for this visit.  Physical Exam:  General:  Alert and oriented, in no acute distress. Pulm:  Breathing unlabored. Psy:  Normal mood, congruent affect. Skin: No rash or erythema. Knees: Trace effusion bilaterally with no warmth or erythema.  Imaging: None today.  Assessment & Plan: 1.  Bilateral knee osteoarthritis -Discussed options with her, elected to inject with cortisone in both knees today.  We will request approval for bilateral gel injections for the future.     Procedures: Bilateral knee steroid injections: After sterile prep with Betadine, injected 5 cc 1% lidocaine without epinephrine and 40 mg methylprednisolone from lateral midpatellar approach into each knee, a flash of synovial fluid was obtained prior to each injection.    PMFS History: Patient Active Problem List   Diagnosis Date Noted  . Gallstone 06/25/2018  . Nausea 06/25/2018  . Right upper quadrant pain 06/25/2018  . Steatosis of liver 06/25/2018  . Carpal tunnel syndrome of right wrist 02/10/2018  . Pain in right hand 01/27/2018  . Menorrhagia 02/22/2015  . Morbid obesity ([place]) 11/10/2012   Past Medical History:  Diagnosis Date  . Arthritis    Left knee  .  Hypertension   . PONV (postoperative nausea and vomiting)     Family History  Problem Relation Age of Onset  . Hyperlipidemia Father   . Heart attack Father   . Heart attack Other   . Hypertension Other   . Cancer Other   . Stroke Other   . Hypertension Mother   . Parkinson's disease Mother     Past Surgical History:  Procedure Laterality Date  . CARPAL TUNNEL RELEASE Right 04/04/2018   Procedure: CARPAL TUNNEL RELEASE;  Surgeon: Latanya Maudlin, MD;  Location: [facility];  Service: Orthopedics;  Laterality: Right;  29min  . CHOLECYSTECTOMY    . gastic sleeve  2015  . HYSTEROSCOPY WITH NOVASURE N/A 03/29/2015   Procedure: HYSTEROSCOPY WITH NOVASURE;  Surgeon: Anastasio Auerbach, MD;  Location: [facility];  Service: Gynecology;  Laterality: N/A;  to follow first case around 10:15am.  Requests one hour OR time.  . INTRAUTERINE DEVICE INSERTION     X 2  Spontaneously extruded X 2.   2014/2015  . TUBAL LIGATION  approximately 2000   as an interval procedure historically   Social History   Occupational History  . Not on file  Tobacco Use  . Smoking status: Former Research scientist (life sciences)  . Smokeless tobacco: Never Used  Substance and Sexual Activity  . Alcohol use: Yes    Comment: occas   . Drug use: No  .  Sexual activity: Yes    Birth control/protection: Surgical    Comment: BTL

## 2019-02-26 NOTE — Telephone Encounter (Signed)
IC patient LM to call me to discuss.

## 2019-02-26 NOTE — Telephone Encounter (Signed)
Patient is going to wait until after Jan to have gel injections.  She said you had mentioned medication for pain, and she would still like you to send that to her pharmacy if you will.  So she will have it if needed.   [place]

## 2019-02-26 NOTE — Telephone Encounter (Signed)
Send in for benefits investigation in January.  Then call pt to advise and discuss when she wants to proceed with these injections.

## 2019-02-26 NOTE — Telephone Encounter (Signed)
Call patient to see how soon she wants to have the gel injections.  She just had cortisone injections.

## 2019-02-27 ENCOUNTER — Encounter: Payer: Self-pay | Admitting: Radiology

## 2019-02-27 ENCOUNTER — Ambulatory Visit: Payer: 59 | Admitting: Family Medicine

## 2019-02-27 NOTE — Telephone Encounter (Signed)
See below, check benefits after Jan 1.  Call patient to review/discuss when/if she wants to proceed.

## 2019-02-27 NOTE — Telephone Encounter (Signed)
I was going to call in an NSAID, but later realized she's allergic.  Hopefully the shot will kick in soon and she won't need anything.

## 2019-02-27 NOTE — Telephone Encounter (Signed)
I sent mychart message to patient advising.

## 2019-03-03 NOTE — Telephone Encounter (Signed)
Noted! Thank you

## 2019-03-09 ENCOUNTER — Encounter: Payer: Self-pay | Admitting: Emergency Medicine

## 2019-03-09 ENCOUNTER — Other Ambulatory Visit: Payer: Self-pay

## 2019-03-09 ENCOUNTER — Ambulatory Visit
Admission: EM | Admit: 2019-03-09 | Discharge: 2019-03-09 | Disposition: A | Discharge status: Home / Self Care | Payer: 59 | Attending: Emergency Medicine | Admitting: Emergency Medicine

## 2019-03-09 DIAGNOSIS — U071 COVID-19: Secondary | ICD-10-CM | POA: Diagnosis not present

## 2019-03-09 DIAGNOSIS — Z760 Encounter for issue of repeat prescription: Secondary | ICD-10-CM

## 2019-03-09 LAB — POC SARS CORONAVIRUS 2 AG -  ED: SARS Coronavirus 2 Ag: POSITIVE — AB

## 2019-03-09 MED ORDER — AMLODIPINE BESYLATE 5 MG PO TABS: 5.0000 mg | ORAL_TABLET | Freq: Every day | ORAL | 0 refills | Status: DC

## 2019-03-09 NOTE — ED Provider Notes (Signed)
[name]    CSN: [id number] Arrival date & time: 03/09/19  1626      History   Chief Complaint Chief Complaint  Patient presents with  . jittery    HPI Karen [name] is a 48 y.o. female with history of hypertension, obesity presenting for "feeling off "since Tuesday.  Patient has had intermittent left hand distal hand tingling without known exacerbating factor, increase shortness of breath, jitteriness, sweating.  Patient thought it may be due to elevated blood pressure she has been out of her medications for last 2 months.  Patient denies known sick contacts, fever, cough, chest pain.  Patient also admits to generalized headaches which are alleviated by Tylenol.  Denies thunderclap headache, fall, trauma, change in vision or hearing.    Past Medical History:  Diagnosis Date  . Arthritis    Left knee  . Hypertension   . PONV (postoperative nausea and vomiting)     Patient Active Problem List   Diagnosis Date Noted  . Gallstone 06/25/2018  . Nausea 06/25/2018  . Right upper quadrant pain 06/25/2018  . Steatosis of liver 06/25/2018  . Carpal tunnel syndrome of right wrist 02/10/2018  . Pain in right hand 01/27/2018  . Menorrhagia 02/22/2015  . Morbid obesity ([place]) 11/10/2012    Past Surgical History:  Procedure Laterality Date  . CARPAL TUNNEL RELEASE Right 04/04/2018   Procedure: CARPAL TUNNEL RELEASE;  Surgeon: Latanya Maudlin, MD;  Location: [facility];  Service: Orthopedics;  Laterality: Right;  72min  . CHOLECYSTECTOMY    . gastic sleeve  2015  . HYSTEROSCOPY WITH NOVASURE N/A 03/29/2015   Procedure: HYSTEROSCOPY WITH NOVASURE;  Surgeon: Anastasio Auerbach, MD;  Location: [facility];  Service: Gynecology;  Laterality: N/A;  to follow first case around 10:15am.  Requests one hour OR time.  . INTRAUTERINE DEVICE INSERTION     X 2  Spontaneously extruded X 2.   2014/2015  . TUBAL LIGATION  approximately 2000   as an interval procedure historically     OB History    Gravida  2   Para  2   Term      Preterm      AB      Living        SAB      TAB      Ectopic      Multiple      Live Births               Home Medications    Prior to Admission medications   Medication Sig Start Date End Date Taking? Authorizing Provider  amLODipine (NORVASC) 5 MG tablet Take 1 tablet (5 mg total) by mouth daily. 03/09/19   Hall-Potvin, [place], PA-C  b complex vitamins tablet Take 1 tablet by mouth daily.    [provider]  Calcium Carbonate-Vitamin D (CALCIUM-VITAMIN D) 600-125 MG-UNIT TABS Take 1 tablet by mouth daily.    [provider]  Cholecalciferol (VITAMIN D-3) 25 MCG (1000 UT) CAPS Take 2,000 Units by mouth daily.    [provider]  gabapentin (NEURONTIN) 300 MG capsule START WITH 1 CAPSULE AT BEDTIME X 3 DAYS THEN 2 CAPS AT BEDTIME EVERY DAY 05/27/18   [provider]  Multiple Vitamins-Minerals (HAIR/SKIN/NAILS PO) Take 1 tablet by mouth daily.    [provider]  Omega-3 Fatty Acids (FISH OIL PO) Take 1 capsule by mouth daily.    [provider]  ondansetron (ZOFRAN-ODT) 8 MG  disintegrating tablet DISSOLVE 1 TABLET ON TONGUE EVERY 8 HOURS IF NEEDED FOR NAUSEA AND VOMITING 04/07/18   [provider]  vitamin B-12 (CYANOCOBALAMIN) 1000 MCG tablet Take 5,000 mcg by mouth daily.    [provider]    Family History Family History  Problem Relation Age of Onset  . Hyperlipidemia Father   . Heart attack Father   . Heart attack Other   . Hypertension Other   . Cancer Other   . Stroke Other   . Hypertension Mother   . Parkinson's disease Mother     Social History Social History   Tobacco Use  . Smoking status: Former Research scientist (life sciences)  . Smokeless tobacco: Never Used  Substance Use Topics  . Alcohol use: Yes    Comment: occas   . Drug use: No     Allergies   Nsaids and Other   Review of Systems Review of Systems  Constitutional:  Positive for fatigue. Negative for activity change, appetite change and fever.  HENT: Negative for ear pain, sinus pain, sore throat and voice change.   Eyes: Negative for pain, redness and visual disturbance.  Respiratory: Positive for shortness of breath. Negative for cough and wheezing.   Cardiovascular: Negative for chest pain, palpitations and leg swelling.  Gastrointestinal: Negative for abdominal distention, abdominal pain, blood in stool, diarrhea and vomiting.  Musculoskeletal: Negative for arthralgias and myalgias.  Skin: Negative for rash and wound.  Neurological: Positive for headaches. Negative for dizziness, syncope, facial asymmetry, weakness and light-headedness.     Physical Exam Triage Vital Signs ED Triage Vitals [03/09/19 1636]  Enc Vitals Group     BP 134/85     Pulse Rate (!) 111     Resp 18     Temp 97.8 F (36.6 C)     Temp Source Temporal     SpO2 95 %     Weight      Height      Head Circumference      Peak Flow      Pain Score 3     Pain Loc      Pain Edu?      Excl. in [place]?    No data found.  Updated Vital Signs BP 134/85 (BP Location: Left Wrist)   Pulse 97 Comment: Taken by APP  Temp 97.8 F (36.6 C) (Temporal)   Resp 18   SpO2 95%   Visual Acuity Right Eye Distance:   Left Eye Distance:   Bilateral Distance:    Right Eye Near:   Left Eye Near:    Bilateral Near:     Physical Exam Constitutional:      General: She is not in acute distress.    Appearance: She is obese. She is not ill-appearing.  HENT:     Head: Normocephalic and atraumatic.     Mouth/Throat:     Mouth: Mucous membranes are moist.     Pharynx: Oropharynx is clear.  Eyes:     General: No scleral icterus.    Pupils: Pupils are equal, round, and reactive to light.  Neck:     Musculoskeletal: Normal range of motion. No neck rigidity.  Cardiovascular:     Rate and Rhythm: Normal rate.  Pulmonary:     Effort: Pulmonary effort is normal. No respiratory distress.      Breath sounds: No wheezing.  Musculoskeletal: Normal range of motion.     Right lower leg: No edema.     Left lower leg:  No edema.  Skin:    Capillary Refill: Capillary refill takes less than 2 seconds.     Coloration: Skin is not jaundiced or pale.  Neurological:     General: No focal deficit present.     Mental Status: She is alert and oriented to person, place, and time.      UC Treatments / Results  Labs (all labs ordered are listed, but only abnormal results are displayed) Labs Reviewed  POC SARS CORONAVIRUS 2 AG -  ED - Abnormal; Notable for the following components:      Result Value   SARS Coronavirus 2 Ag Positive (*)    All other components within normal limits    EKG   Radiology No results found.  Procedures Procedures (including critical care time)  Medications Ordered in UC Medications - No data to display  Initial Impression / Assessment and Plan / UC Course  I have reviewed the triage vital signs and the nursing notes.  Pertinent labs & imaging results that were available during my care of the patient were reviewed by me and considered in my medical decision making (see chart for details).     Patient with vague constitutional symptoms.  Afebrile, nontoxic, and without neurocognitive deficit.  Low concern for ACS or malignant process at this time.  POC Covid testing done in office, reviewed by me: Positive.  Will provide refill for patient's blood pressure medication.  Patient quarantine until 14 days since symptom onset.  Return precautions discussed, patient verbalized understanding and is agreeable to plan. Final Clinical Impressions(s) / UC Diagnoses   Final diagnoses:  [id number] virus infection     Discharge Instructions     Your COVID test is positive - it is important to quarantine / isolate at home for 14 days since her symptoms started. If you would like further evaluation for persistent or worsening symptoms, you may schedule an  E-visit or virtual (video) visit throughout the [facility] app or website.  PLEASE NOTE: If you develop severe chest pain or shortness of breath please go to the ER or call 9-1-1 for further evaluation --> DO NOT schedule electronic or virtual visits for this. Please call our office for further guidance / recommendations as needed.    ED Prescriptions    Medication Sig Dispense Auth. Provider   amLODipine (NORVASC) 5 MG tablet Take 1 tablet (5 mg total) by mouth daily. 30 tablet Hall-Potvin, [place], PA-C     PDMP not reviewed this encounter.   Neldon Mc [place], [place] 03/09/19 1737

## 2019-03-09 NOTE — Discharge Instructions (Addendum)
Your COVID test is positive - it is important to quarantine / isolate at home for 14 days since her symptoms started. If you would like further evaluation for persistent or worsening symptoms, you may schedule an E-visit or virtual (video) visit throughout the [facility] app or website.  PLEASE NOTE: If you develop severe chest pain or shortness of breath please go to the ER or call 9-1-1 for further evaluation --> DO NOT schedule electronic or virtual visits for this. Please call our office for further guidance / recommendations as needed.

## 2019-03-09 NOTE — ED Notes (Signed)
Patient able to ambulate independently  

## 2019-03-09 NOTE — ED Triage Notes (Addendum)
Pt presents to [facility] for assessment of jitteriness, sweats, SOB and left hand tingling x 1 week.  Hx of hypertension, ha snot been taking HTN meds x 2 months due to PCP moving and COVID.  Pt has an appointment with PCP next door, but not until mid December.

## 2019-03-24 ENCOUNTER — Telehealth: Payer: Self-pay | Admitting: Emergency Medicine

## 2019-03-24 NOTE — Telephone Encounter (Signed)
Pt called to discuss recent visit with RN from work who assessed her worsening of symptoms since her COVID diagnosis in office.  Patient states the RN at work informed her she should receive an inhaler.  This RN confirmed with patient that these symptoms developed after her visit with [place], and at the time there was no need for prescription of medications.  This RN explained to patient that an additional visit with re-assessment by a provider is necessary for changes in symptoms/new symptoms and medication prescription.  Patient verbalized frustration with this process and was given the Patient Experience number for follow up on her concerns.

## 2019-03-24 NOTE — Telephone Encounter (Signed)
Attempted to return patient's call. Left voicemail.

## 2019-04-08 ENCOUNTER — Ambulatory Visit (INDEPENDENT_AMBULATORY_CARE_PROVIDER_SITE_OTHER): Payer: 59 | Admitting: Physician Assistant

## 2019-04-08 DIAGNOSIS — R519 Headache, unspecified: Secondary | ICD-10-CM | POA: Diagnosis not present

## 2019-04-08 DIAGNOSIS — R110 Nausea: Secondary | ICD-10-CM | POA: Diagnosis not present

## 2019-04-08 DIAGNOSIS — J209 Acute bronchitis, unspecified: Secondary | ICD-10-CM

## 2019-04-08 DIAGNOSIS — R05 Cough: Secondary | ICD-10-CM

## 2019-04-08 MED ORDER — AZITHROMYCIN 250 MG PO TABS: ORAL_TABLET | ORAL | 0 refills | Status: DC

## 2019-04-08 MED ORDER — FLUCONAZOLE 150 MG PO TABS: 150.0000 mg | ORAL_TABLET | Freq: Once | ORAL | 0 refills | Status: AC

## 2019-04-08 MED ORDER — ONDANSETRON HCL 8 MG PO TABS: 8.0000 mg | ORAL_TABLET | Freq: Three times a day (TID) | ORAL | 0 refills | Status: DC | PRN

## 2019-04-08 NOTE — Progress Notes (Signed)
Virtual Visit via Telephone Note  I connected with Karen [name] on 04/08/19 at  3:30 PM EST by telephone and verified that I am speaking with the correct person using two identifiers.   I discussed the limitations, risks, security and privacy concerns of performing an evaluation and management service by telephone and the availability of in person appointments. I also discussed with the patient that there may be a patient responsible charge related to this service. The patient expressed understanding and agreed to proceed.  Patient location:  home My Location:  [place] office Persons on the call:  Me and the patient   History of Present Illness:  Patient tested + for Covid 03/09/2019 and still doesn't feel back to normal.  Finished 5 day course of prednisone 1 week ago.  Denies respiratory distress.  No fever in over 1 week.  Occasional discolored mucus with cough.  More tired than normal.  Some HA and nausea w/o vomiting.  Overall feeling better but says only slowly improving.  Still with nasal congestion.  ST on and off   Observations/Objective:  NAD.  A&Ox3   Assessment and Plan: 1. Nausea without vomiting Resolving Covid-should be out if contagious period bc test was +03/09/2019 - ondansetron (ZOFRAN) 8 MG tablet; Take 1 tablet (8 mg total) by mouth every 8 (eight) hours as needed for nausea or vomiting.  Dispense: 20 tablet; Refill: 0  2. Acute bronchitis, unspecified organism Salt water gargles for ST,  advil or Tylenol per pkg.  Fluids, rest.  To ED if worsens - azithromycin (ZITHROMAX) 250 MG tablet; Take 2 today then 1 daily  Dispense: 6 tablet; Refill: 0 - fluconazole (DIFLUCAN) 150 MG tablet; Take 1 tablet (150 mg total) by mouth once for 1 dose.  Dispense: 1 tablet; Refill: 0    Follow Up Instructions: 2-3 weeks to assign PCP   I discussed the assessment and treatment plan with the patient. The patient was provided an opportunity to ask questions and all were  answered. The patient agreed with the plan and demonstrated an understanding of the instructions.   The patient was advised to call back or seek an in-person evaluation if the symptoms worsen or if the condition fails to improve as anticipated.  I provided 12 minutes of non-face-to-face time during this encounter.   Freeman Caldron, PA-C  Patient ID: Karen [name], female   DOB: February 12, 1971, 48 y.o.   MRN: [id number]

## 2019-04-13 ENCOUNTER — Ambulatory Visit: Payer: 59

## 2019-04-21 ENCOUNTER — Telehealth: Payer: Self-pay | Admitting: Family Medicine

## 2019-04-21 NOTE — Telephone Encounter (Signed)
Patient called saying that she is still experiencing COVID symptoms, she said that you had mentioned that if she had finished medications and was still having symptoms to give [place] a call. She is requesting an inhaler and headache medication. Please contact patient at earliest convenience.

## 2019-04-22 ENCOUNTER — Other Ambulatory Visit: Payer: Self-pay | Admitting: Physician Assistant

## 2019-04-22 ENCOUNTER — Telehealth: Payer: Self-pay

## 2019-04-22 MED ORDER — ALBUTEROL SULFATE HFA 108 (90 BASE) MCG/ACT IN AERS: 2.0000 | INHALATION_SPRAY | Freq: Four times a day (QID) | RESPIRATORY_TRACT | 1 refills | Status: DC | PRN

## 2019-04-22 NOTE — Telephone Encounter (Signed)
Submitted VOB for Durolane, bilateral knee. °

## 2019-04-24 ENCOUNTER — Telehealth: Payer: Self-pay

## 2019-04-24 NOTE — Telephone Encounter (Signed)
Talked with patient and advised her that she is approved for gel injection.  Patient stated that she will call back to schedule appointment with Dr. Junius Roads.  Approved for Durolane, bilateral knee. Buy & Bill Must meet deductible first Patient will be responsible for 20% OOP. Co-pay of $25.00 No PA required

## 2019-04-28 ENCOUNTER — Telehealth: Payer: Self-pay | Admitting: Family Medicine

## 2019-04-28 NOTE — Telephone Encounter (Signed)
Patient called.   She said she missed a call from our office that was about scheduling a procedure with [facility]. I know Dr. Junius Roads doesn't do surgery so I was unsure what to schedule and how long. I told her that I would notify him and his nurse as well as have them give her another call back.   Call back number: [phone]

## 2019-04-28 NOTE — Telephone Encounter (Signed)
I called the patient and left a message for her to call back to schedule bilateral knee Durolane injections with Dr. Junius Roads (these have already been approved).

## 2019-04-29 ENCOUNTER — Telehealth: Payer: Self-pay

## 2019-04-29 NOTE — Telephone Encounter (Signed)
Patient called asking about her appointment on Friday. I let her know that due to her having symptoms it would have to be a video visit or over the phone. She then became very rude and said that she needed to be in person because her job is requiring her to have labs. I instructed her that we would be able to order the labs and she may have them drawn at [facility]. She then said that I was refusing to treat her due to her having tested positive and that was not a policy. I informed her that we are not refusing to treat her as we have offered two options for her appointment. She then stated that it was an extreme inconvenience for her to have to have labs drawn somewhere else. She began yelling and fussing at me, blaming me for her appointment having to be over the phone. I explained that it being over the was only to protect herself, other patients, and our employees. She then demanded to speak with someone else. I transferred the call to [facility]

## 2019-04-29 NOTE — Telephone Encounter (Addendum)
Patient called in & spoke with front desk staff. She was informed that her appointment on 05/01/2019 would be a virtual appointment due to her still having COVID symptoms. She states that her insurance requires a physical for her to keep her benefits. I informed her that the [facility] system has a protocol in place that is there to protect both patients & staff. She says that I am ok with her losing her benefits. I informed that that was not the case, I was just relaying the protocol. I asked her if she wanted to keep her appointment on 05/01/2019. She said yes. I informed her again that it would be a MyChart video visit. Patient continued yelling & said that she would be going to [place] [facility]. Let patient know that call would be ended.

## 2019-05-01 ENCOUNTER — Other Ambulatory Visit: Payer: Self-pay

## 2019-05-01 ENCOUNTER — Encounter: Payer: Self-pay | Admitting: Family Medicine

## 2019-05-01 ENCOUNTER — Ambulatory Visit: Payer: Self-pay

## 2019-05-01 ENCOUNTER — Telehealth: Payer: 59 | Admitting: Internal Medicine

## 2019-05-01 ENCOUNTER — Ambulatory Visit (INDEPENDENT_AMBULATORY_CARE_PROVIDER_SITE_OTHER): Payer: 59 | Admitting: Family Medicine

## 2019-05-01 DIAGNOSIS — U071 COVID-19: Secondary | ICD-10-CM

## 2019-05-01 DIAGNOSIS — M1712 Unilateral primary osteoarthritis, left knee: Secondary | ICD-10-CM

## 2019-05-01 DIAGNOSIS — M1711 Unilateral primary osteoarthritis, right knee: Secondary | ICD-10-CM | POA: Diagnosis not present

## 2019-05-01 NOTE — Progress Notes (Signed)
   Office Visit Note   Patient: Karen [name]           Date of Birth: [birth year]           MRN: [id number] Visit Date: 05/01/2019 Requested by: Glendon Axe, MD [place],  [place] [id number] PCP: Glendon Axe, MD  Subjective: Chief Complaint  Patient presents with  . bilateral knee Durolane injections    HPI: She is here for bilateral knee Durolane injections for osteoarthritis.              ROS:   All other systems were reviewed and are negative.  Objective: Vital Signs: There were no vitals taken for this visit.  Physical Exam:  General:  Alert and oriented, in no acute distress. Pulm:  Breathing unlabored. Psy:  Normal mood, congruent affect.  Knees: Trace effusion bilaterally with no warmth.  Imaging: None other than for needle guidance  Assessment & Plan: 1.  Bilateral knee osteoarthritis -Ultrasound-guided Durolane injections today.  Ultrasound used due to body habitus to ensure accurate needle placement.     Procedures: Bilateral knee injections: After sterile prep with Betadine, injected 3 cc 1% lidocaine without epinephrine, then Durolane from medial midpatellar approach, using ultrasound to guide needle placement into the medial joint recess.  A flash of synovial fluid was obtained prior to each injection confirming intra-articular placement.  A 22-gauge spinal needle was used.    PMFS History: Patient Active Problem List   Diagnosis Date Noted  . Gallstone 06/25/2018  . Nausea 06/25/2018  . Right upper quadrant pain 06/25/2018  . Steatosis of liver 06/25/2018  . Carpal tunnel syndrome of right wrist 02/10/2018  . Pain in right hand 01/27/2018  . Menorrhagia 02/22/2015  . Morbid obesity ([place]) 11/10/2012   Past Medical History:  Diagnosis Date  . Arthritis    Left knee  . Hypertension   . PONV (postoperative nausea and vomiting)     Family History  Problem Relation Age of Onset  . Hyperlipidemia Father   . Heart attack Father     . Heart attack Other   . Hypertension Other   . Cancer Other   . Stroke Other   . Hypertension Mother   . Parkinson's disease Mother     Past Surgical History:  Procedure Laterality Date  . CARPAL TUNNEL RELEASE Right 04/04/2018   Procedure: CARPAL TUNNEL RELEASE;  Surgeon: Latanya Maudlin, MD;  Location: [facility];  Service: Orthopedics;  Laterality: Right;  59min  . CHOLECYSTECTOMY    . gastic sleeve  2015  . HYSTEROSCOPY WITH NOVASURE N/A 03/29/2015   Procedure: HYSTEROSCOPY WITH NOVASURE;  Surgeon: Anastasio Auerbach, MD;  Location: [facility];  Service: Gynecology;  Laterality: N/A;  to follow first case around 10:15am.  Requests one hour OR time.  . INTRAUTERINE DEVICE INSERTION     X 2  Spontaneously extruded X 2.   2014/2015  . TUBAL LIGATION  approximately 2000   as an interval procedure historically   Social History   Occupational History  . Not on file  Tobacco Use  . Smoking status: Former Research scientist (life sciences)  . Smokeless tobacco: Never Used  Substance and Sexual Activity  . Alcohol use: Yes    Comment: occas   . Drug use: No  . Sexual activity: Yes    Birth control/protection: Surgical    Comment: BTL

## 2019-05-01 NOTE — Progress Notes (Signed)
Patient was scheduled as a MyChart visit for follow up on COVID. However, when speaking to patient she reports she was prescribed an inhaler last week for her symptoms. While she still has a mild sore throat and occasional headaches, her symptoms have essentially resolved. She was due for a physical exam and bloodwork. She has no other concerns today. Given >21 days since positive test and dramatic improvement in symptoms, patient should be able to have an in office visit. Will have [facility] call to schedule. Will do no charge for today's telephone call.   Phill Myron, D.O. Primary Care at [facility]  05/01/2019, 10:21 AM

## 2019-05-04 ENCOUNTER — Telehealth: Payer: Self-pay | Admitting: Family Medicine

## 2019-05-04 MED ORDER — TRAMADOL HCL 50 MG PO TABS: 50.0000 mg | ORAL_TABLET | Freq: Four times a day (QID) | ORAL | 0 refills | Status: DC | PRN

## 2019-05-04 NOTE — Telephone Encounter (Signed)
Please advise 

## 2019-05-04 NOTE — Telephone Encounter (Signed)
Patient came in on Friday and stated Tramadol was supposed to be called in and she called her pharmacy and it had not been.  Please call patient to advise.  [phone]

## 2019-05-04 NOTE — Telephone Encounter (Signed)
Left message on patient's voice mail - Rx has been sent in to her pharmacy.

## 2019-05-04 NOTE — Telephone Encounter (Signed)
Sent!

## 2019-05-18 ENCOUNTER — Telehealth: Payer: Self-pay

## 2019-05-18 NOTE — Telephone Encounter (Signed)
Patient called stating that she received gel injections on 05/01/2019, but is still having a lot of pain in her knees.  Would like a call back to discuss?  Cb# is [phone].  Please advise.  Thank you.

## 2019-05-19 NOTE — Telephone Encounter (Signed)
I advised the patient.

## 2019-05-19 NOTE — Telephone Encounter (Signed)
The patient is s/p bilateral Durolane injections 2 & 1/2 weeks ago. She has not noticed any change in her pain level thus far. Does she give it more time or should they have started working by now? What would be the next step if these do not work?

## 2019-05-19 NOTE — Telephone Encounter (Signed)
Unlike cortisone, these can take a few weeks to start helping.  I would suggest just giving it a little more time.

## 2019-05-19 NOTE — Telephone Encounter (Signed)
Left message on patient's voicemail to call back

## 2019-05-20 ENCOUNTER — Ambulatory Visit: Payer: 59

## 2019-05-29 ENCOUNTER — Telehealth: Payer: Self-pay

## 2019-05-29 NOTE — Telephone Encounter (Signed)
Called patient to do their pre-visit COVID screening.  Have you tested positive for COVID or are you currently waiting for COVID test results? no  Have you recently traveled internationally([place], [place], [place], [place], [place]) or within the [place] to a hotspot area([place], [place], [place], [place], [place])? no  Are you currently experiencing any of the following symptoms: fever, cough, SHOB, fatigue, body aches, loss of smell/taste, rash, diarrhea, vomiting, severe headaches, weakness, sore throat? no  Have you been in contact with anyone who has recently travelled? no  Have you been in contact with anyone who is experiencing any of the above symptoms or been diagnosed with COVID  or works in or has recently visited a SNF? no  Did ask patient to come fasting for physical labs.

## 2019-06-01 ENCOUNTER — Telehealth: Payer: Self-pay | Admitting: Family Medicine

## 2019-06-01 ENCOUNTER — Encounter: Payer: 59 | Admitting: Internal Medicine

## 2019-06-01 NOTE — Telephone Encounter (Signed)
Called patient and LVM to reschedule appointment for today

## 2019-06-19 ENCOUNTER — Other Ambulatory Visit: Payer: Self-pay | Admitting: Family

## 2019-06-19 ENCOUNTER — Ambulatory Visit: Payer: 59

## 2019-06-19 DIAGNOSIS — Z1231 Encounter for screening mammogram for malignant neoplasm of breast: Secondary | ICD-10-CM

## 2019-06-22 ENCOUNTER — Telehealth: Payer: Self-pay

## 2019-06-22 ENCOUNTER — Ambulatory Visit
Admission: RE | Admit: 2019-06-22 | Discharge: 2019-06-22 | Disposition: A | Discharge status: Home / Self Care | Payer: 59 | Source: Ambulatory Visit | Attending: Family | Admitting: Family

## 2019-06-22 ENCOUNTER — Other Ambulatory Visit: Payer: Self-pay

## 2019-06-22 DIAGNOSIS — Z1231 Encounter for screening mammogram for malignant neoplasm of breast: Secondary | ICD-10-CM

## 2019-06-22 NOTE — Telephone Encounter (Signed)
Called patient to do their pre-visit COVID screening.  Call went to voicemail. Unable to do prescreening.  

## 2019-06-23 ENCOUNTER — Telehealth: Payer: Self-pay

## 2019-06-23 ENCOUNTER — Other Ambulatory Visit: Payer: Self-pay | Admitting: Family

## 2019-06-23 ENCOUNTER — Ambulatory Visit (INDEPENDENT_AMBULATORY_CARE_PROVIDER_SITE_OTHER): Payer: 59 | Admitting: Family

## 2019-06-23 ENCOUNTER — Encounter: Payer: Self-pay | Admitting: Neurology

## 2019-06-23 VITALS — BP 131/90 | HR 103 | Temp 97.2°F | Resp 17 | Wt 352.8 lb

## 2019-06-23 DIAGNOSIS — R079 Chest pain, unspecified: Secondary | ICD-10-CM

## 2019-06-23 DIAGNOSIS — G4489 Other headache syndrome: Secondary | ICD-10-CM

## 2019-06-23 DIAGNOSIS — R0602 Shortness of breath: Secondary | ICD-10-CM

## 2019-06-23 DIAGNOSIS — I10 Essential (primary) hypertension: Secondary | ICD-10-CM

## 2019-06-23 DIAGNOSIS — R928 Other abnormal and inconclusive findings on diagnostic imaging of breast: Secondary | ICD-10-CM

## 2019-06-23 DIAGNOSIS — Z8616 Personal history of COVID-19: Secondary | ICD-10-CM

## 2019-06-23 MED ORDER — AMLODIPINE BESYLATE 10 MG PO TABS: 10.0000 mg | ORAL_TABLET | Freq: Every day | ORAL | 0 refills | Status: DC

## 2019-06-23 MED ORDER — TOPIRAMATE 25 MG PO TABS: 25.0000 mg | ORAL_TABLET | Freq: Every day | ORAL | 1 refills | Status: DC

## 2019-06-23 MED ORDER — BUTALBITAL-APAP-CAFFEINE 50-325-40 MG PO TABS: 1.0000 | ORAL_TABLET | Freq: Three times a day (TID) | ORAL | 1 refills | Status: DC | PRN

## 2019-06-23 NOTE — Telephone Encounter (Signed)
NOTES ON FILE FROM [facility] [phone], REFERRAL SENT TO SCHEDULING

## 2019-06-23 NOTE — Patient Instructions (Addendum)
Follow-up with primary provider in 1 month. Shortness of Breath, Adult Shortness of breath means you have trouble breathing. Shortness of breath could be a sign of a medical problem. Follow these instructions at home:   Watch for any changes in your symptoms.  Do not use any products that contain nicotine or tobacco, such as cigarettes, e-cigarettes, and chewing tobacco.  Do not smoke. Smoking can cause shortness of breath. If you need help to quit smoking, ask your doctor.  Avoid things that can make it harder to breathe, such as: ? Mold. ? Dust. ? Air pollution. ? Chemical smells. ? Things that can cause allergy symptoms (allergens), if you have allergies.  Keep your living space clean. Use products that help remove mold and dust.  Rest as needed. Slowly return to your normal activities.  Take over-the-counter and prescription medicines only as told by your doctor. This includes oxygen therapy and inhaled medicines.  Keep all follow-up visits as told by your doctor. This is important. Contact a doctor if:  Your condition does not get better as soon as expected.  You have a hard time doing your normal activities, even after you rest.  You have new symptoms. Get help right away if:  Your shortness of breath gets worse.  You have trouble breathing when you are resting.  You feel light-headed or you pass out (faint).  You have a cough that is not helped by medicines.  You cough up blood.  You have pain with breathing.  You have pain in your chest, arms, shoulders, or belly (abdomen).  You have a fever.  You cannot walk up stairs.  You cannot exercise the way you normally do. These symptoms may represent a serious problem that is an emergency. Do not wait to see if the symptoms will go away. Get medical help right away. Call your local emergency services (911 in the [place] Do not drive yourself to the hospital. Summary  Shortness of breath is when you have  trouble breathing enough air. It can be a sign of a medical problem.  Avoid things that make it hard for you to breathe, such as smoking, pollution, mold, and dust.  Watch for any changes in your symptoms. Contact your doctor if you do not get better or you get worse. This information is not intended to replace advice given to you by your health care provider. Make sure you discuss any questions you have with your health care provider. Document Revised: 08/26/2017 Document Reviewed: 08/26/2017 Elsevier Patient Education  [place].

## 2019-06-23 NOTE — Progress Notes (Signed)
Patient ID: Karen [name], female    DOB: Apr 01, 1971  MRN: [id number]  CC: Shortness of breath, chest pain, and headaches  Subjective: Karen [name] is a 49 y.o. female with history of essential hypertension, gallstone, steatosis of liver, carpal tunnel syndrome of right wrist, morbid obesity, and menorrhagia. Last provider at [facility], cant recall provider, about 2 years ago. Her concerns today include: Shortness of breath, chest pain, and headaches.  1. SHORTNESS OF BREATH:  Previously diagnosed with Covid in November 2020 (tested at [facility]) and then again in February 2021 (tested at [facility]). Reports shortness of breath started in November 2020 with initial Covid infection. Shortness of breath was worse in December 2020 and January 2021 then improved in early February 2021. Shortness of breath began to worsen at the end of February 2021 and has remained the same since. Reports was prescribed an inhaler which she uses about 2-3 times per week, cannot recall name, which helps. Has shortness of breath at rest and with activity.  Chest pain occurs with shortness of breath sometimes. Denies history of asthma, cough, nighttime wheezing, fever, sore throat, smoking, illicit drugs, recent long distance travel, birth control pills/injections. Swelling of bilateral lower extremities with the right lower extremity being the worst, cannot recall when this began. Eating and drinking as normal.  2. CHEST PAIN:   Reports chest pain since the end of February 2021. Rating 6/10. Takes Tylenol for pain management and feels it helps. Described as feeling like something heavy sitting at the top of my chest.  Radiates to left arm and fingers with numbing and tingling sensation and left upper back. Chest pain comes and goes, present at rest, and not worse with movement. Episodes of chest pain causes shortness of breath and/or headaches sometimes. Family history of father with massive heart attack at 82 years  old and triple bypass in his 92s.  3. HEADACHES:  Began December 2020. States headaches are not every day (about 17 days/month ) and come and go. Rating 5/10.  Takes Tylenol (two 500 mg pills and then another two 500 mg pills four hours later) for management which helps some. Takes around 45 minutes for Tylenol to take effect. Nothing makes headaches worse. Located at frontal lobe and back of head, with blurred vision, sensitivity to light, and nausea present.   4. HYPERTENSION FOLLOW-UP:  Currently taking: see medication list Med Adherence: [x]  Yes    []  No SOB? [x]  Yes    []  No Chest Pain?: [x]  Yes    []  No Leg swelling?: [x]  Yes    []  No Headaches?: [x]  Yes    []  No Dizziness? []  Yes    [x]  No Comments: Not seen recently by primary care for management of chronic condition related to having Covid.    Patient Active Problem List   Diagnosis Date Noted  . Gallstone 06/25/2018  . Steatosis of liver 06/25/2018  . Carpal tunnel syndrome of right wrist 02/10/2018  . Essential hypertension 10/26/2016  . Menorrhagia 02/22/2015  . Morbid obesity ([place]) 11/10/2012     Current Outpatient Medications on File Prior to Visit  Medication Sig Dispense Refill  . albuterol (VENTOLIN HFA) 108 (90 Base) MCG/ACT inhaler Inhale 2 puffs into the lungs every 6 (six) hours as needed for wheezing or shortness of breath. 18 g 1  . amLODipine (NORVASC) 5 MG tablet Take 1 tablet (5 mg total) by mouth daily. 30 tablet 0  . b complex vitamins tablet Take 1 tablet by  mouth daily.    . Calcium Carbonate-Vitamin D (CALCIUM-VITAMIN D) 600-125 MG-UNIT TABS Take 1 tablet by mouth daily.    . Cholecalciferol (VITAMIN D-3) 25 MCG (1000 UT) CAPS Take 2,000 Units by mouth daily.    Marland Kitchen CONTRAVE 8-90 MG TB12 Take 2 tablets by mouth 2 (two) times daily.    Marland Kitchen gabapentin (NEURONTIN) 300 MG capsule START WITH 1 CAPSULE AT BEDTIME X 3 DAYS THEN 2 CAPS AT BEDTIME EVERY DAY    . Multiple Vitamins-Minerals (HAIR/SKIN/NAILS PO) Take  1 tablet by mouth daily.    . Omega-3 Fatty Acids (FISH OIL PO) Take 1 capsule by mouth daily.    . traMADol (ULTRAM) 50 MG tablet Take 1 tablet (50 mg total) by mouth every 6 (six) hours as needed. 20 tablet 0  . vitamin B-12 (CYANOCOBALAMIN) 1000 MCG tablet Take 5,000 mcg by mouth daily.     No current facility-administered medications on file prior to visit.    Allergies  Allergen Reactions  . Nsaids     History of gastric sleeve  . Other Other (See Comments)    Patient stated that she can only take chewable and liquid medication Patient says she can now swallow solid pills/capsules/caplets 06/25/2018    Social History   Socioeconomic History  . Marital status: Married    Spouse name: Not on file  . Number of children: Not on file  . Years of education: Not on file  . Highest education level: Not on file  Occupational History  . Not on file  Tobacco Use  . Smoking status: Former Research scientist (life sciences)  . Smokeless tobacco: Never Used  Substance and Sexual Activity  . Alcohol use: Yes    Comment: occas   . Drug use: No  . Sexual activity: Yes    Birth control/protection: Surgical    Comment: BTL  Other Topics Concern  . Not on file  Social History Narrative  . Not on file   Social Determinants of Health   Financial Resource Strain:   . Difficulty of Paying Living Expenses:   Food Insecurity:   . Worried About Charity fundraiser in the Last Year:   . Arboriculturist in the Last Year:   Transportation Needs:   . Film/video editor (Medical):   Marland Kitchen Lack of Transportation (Non-Medical):   Physical Activity:   . Days of Exercise per Week:   . Minutes of Exercise per Session:   Stress:   . Feeling of Stress :   Social Connections:   . Frequency of Communication with Friends and Family:   . Frequency of Social Gatherings with Friends and Family:   . Attends Religious Services:   . Active Member of Clubs or Organizations:   . Attends Archivist Meetings:   Marland Kitchen  Marital Status:   Intimate Partner Violence:   . Fear of Current or Ex-Partner:   . Emotionally Abused:   Marland Kitchen Physically Abused:   . Sexually Abused:     Family History  Problem Relation Age of Onset  . Hyperlipidemia Father   . Heart attack Father   . Heart attack Other   . Hypertension Other   . Cancer Other   . Stroke Other   . Hypertension Mother   . Parkinson's disease Mother     Past Surgical History:  Procedure Laterality Date  . CARPAL TUNNEL RELEASE Right 04/04/2018   Procedure: CARPAL TUNNEL RELEASE;  Surgeon: Latanya Maudlin, MD;  Location: [facility];  Service: Orthopedics;  Laterality: Right;  72min  . CHOLECYSTECTOMY    . gastic sleeve  2015  . HYSTEROSCOPY WITH NOVASURE N/A 03/29/2015   Procedure: HYSTEROSCOPY WITH NOVASURE;  Surgeon: Anastasio Auerbach, MD;  Location: [facility];  Service: Gynecology;  Laterality: N/A;  to follow first case around 10:15am.  Requests one hour OR time.  . INTRAUTERINE DEVICE INSERTION     X 2  Spontaneously extruded X 2.   2014/2015  . TUBAL LIGATION  approximately 2000   as an interval procedure historically    ROS: Review of Systems Negative except as stated above  PHYSICAL EXAM: Vitals with BMI 06/23/2019 03/09/2019 03/09/2019  Height - - -  Weight 352 lbs 13 oz - -  BMI - - -  Systolic [id number] - [id number]  Diastolic 90 - 85  Pulse [id number] 97 111    Physical Exam  General appearance - alert, well appearing, and in no distress, oriented to person, place, and time and overweight Mental status - alert, oriented to person, place, and time, normal mood, behavior, speech, dress, motor activity, and thought processes Eyes - pupils equal and reactive, extraocular eye movements intact, funduscopic exam normal, discs flat and sharp Ears - bilateral TM's and external ear canals normal Nose - normal and patent, no erythema, discharge or polyps and normal nontender sinuses Mouth - mucous membranes moist, pharynx normal without lesions Neck -  supple, no significant adenopathy Lymphatics - no palpable lymphadenopathy, no hepatosplenomegaly Chest - clear to auscultation, no wheezes, rales or rhonchi, symmetric air entry, no tachypnea, retractions or cyanosis, no chest wall tenderness Heart - normal rate, regular rhythm, normal S1, S2, no murmurs, rubs, clicks or gallops Neurological - alert, oriented, normal speech, no focal findings or movement disorder noted, neck supple without rigidity, cranial nerves II through XII intact, funduscopic exam normal, discs flat and sharp, DTR's normal and symmetric, normal muscle tone, no tremors, strength 5/5, Romberg sign negative, normal gait and station, sensory decreased on right aspect of face Musculoskeletal - no joint tenderness or deformity, no muscular tenderness noted, swelling bilateral lower extremities 1+ Extremities - intact peripheral pulses Skin - normal coloration and turgor, no rashes, no suspicious skin lesions noted, two greenish-yellow bruising on left lower extremity  CMP Latest Ref Rng & Units 04/03/2018 08/01/2016 03/21/2015  Glucose 70 - 99 mg/dL 100(H) 88 95  BUN 6 - 20 mg/dL 10 9 11   Creatinine 0.44 - 1.00 mg/dL 0.67 0.67 0.70  Sodium 135 - 145 mmol/L 138 137 138  Potassium 3.5 - 5.1 mmol/L 4.1 3.8 3.6  Chloride 98 - 111 mmol/L 103 102 104  CO2 22 - 32 mmol/L 28 26 26   Calcium 8.9 - 10.3 mg/dL 8.9 9.5 9.3  Total Protein 6.5 - 8.1 g/dL 7.5 8.1 8.1  Total Bilirubin 0.3 - 1.2 mg/dL 0.6 0.6 0.6  Alkaline Phos 38 - 126 U/L 70 83 68  AST 15 - 41 U/L 19 19 17   ALT 0 - 44 U/L 18 17 14    Lipid Panel     Component Value Date/Time   CHOL 150 11/10/2012 0955   TRIG 69 11/10/2012 0955   HDL 32 (L) 11/10/2012 0955   CHOLHDL 4.7 11/10/2012 0955   VLDL 14 11/10/2012 0955   LDLCALC 104 (H) 11/10/2012 0955    CBC    Component Value Date/Time   WBC 8.5 04/04/2018 1244   RBC 4.79 04/04/2018 1244   HGB 13.3 04/04/2018 1244   HGB 10.7 (L) 05/29/2013  0420   HCT 43.5  04/04/2018 1244   HCT 32.6 (L) 05/29/2013 0420   PLT 253 04/04/2018 1244   PLT 262 05/29/2013 0420   MCV 90.8 04/04/2018 1244   MCV 81 05/29/2013 0420   MCH 27.8 04/04/2018 1244   MCHC 30.6 04/04/2018 1244   RDW 14.0 04/04/2018 1244   RDW 16.6 (H) 05/29/2013 0420   LYMPHSABS 1.9 04/04/2018 1244   LYMPHSABS 1.2 05/29/2013 0420   MONOABS 1.0 04/04/2018 1244   MONOABS 0.9 05/29/2013 0420   EOSABS 0.3 04/04/2018 1244   EOSABS 0.0 05/29/2013 0420   BASOSABS 0.0 04/04/2018 1244   BASOSABS 0.0 05/29/2013 0420   Assessment & Plan:  1. SOB (shortness of breath):  - D-dimer, quantitative (not at [facility])  -D-dimer to assess for possibility of venous thromboembolism.  -Brain natriuretic peptide -BNP to assess for possible congestive heart failure.  -CT Angio Chest W/Cm &/Or Wo Cm; Future -CT Angio Chest to rule out possible pulmonary embolism. -Wells Criteria Score 4.5 which is moderate risk for pulmonary embolism. 3 points for clinical symptoms of DVT (leg swelling, pain with palpitation) and 1.5 points for heart rate > 100.    -Ambulate patient taking pulse oximetry before (95%), during (93-94%), and after ambulation (97%). Slight decrease in oxygen saturation during ambulation related to relative hypoxia however oxygen saturation remained in therapeutic range.   -Initially ordered a CBC w/ differential and a CMP. However, patient informed that she had labs drawn last month (at [place]) and did not want to be billed again for the service. Significant results I was able to collect by patient sharing information in mobile phone. Hemoglobin 13; Hematocrit 31; Creatinine 0.65; BUN 8; GFR 120; LDL 109; and LFTs normal.  2. Chest pain in adult: - EKG 12-Lead -EKG resulted sinus rhythm   - D-dimer, quantitative (not at [facility])  - Brain natriuretic peptide  - Ambulatory referral to Cardiology -Referral to cardiology for persistent chest pain since February 2021 and significant family history of  myocardial infarction and coronary artery disease in first degree relative.  -Take baby aspirin 81 mg (purchased over-the-counter) daily which may help decrease risk of heart attacks and strokes.   3. Headache syndrome: - Ambulatory referral to Neurology -Referral to neurology for new onset headache in adult 38 years of age and older.   - topiramate (TOPAMAX) 25 MG tablet; Take 1 tablet (25 mg total) by mouth at bedtime.  Dispense: 30 tablet; Refill: 1 -Topiramate to decrease frequency of headache, take daily at bedtime.   - butalbital-acetaminophen-caffeine (FIORICET) 50-325-40 MG tablet; Take 1-2 tablets by mouth every 8 (eight) hours as needed for headache.  Dispense: 30 tablet; Refill: 1 -Fioricet to take as needed with headache onset.  4. History of COVID-19: -Persistent symptoms of shortness of breath and headaches may be related to history of Covid-19.   5. Essential hypertension:  - amLODipine (NORVASC) 10 MG tablet; Take 1 tablet (10 mg total) by mouth daily.  Dispense: 90 tablet; Refill: 0 -Blood pressure (131/90) not at goal. Increase dose of amlodipine from 5 mg/daily to 10 mg/daily.  -Take blood pressures at home and record results. -Counseled on blood pressure goal of less than 130/80, low-sodium, DASH diet, medication compliance, 150 minutes of moderate intensity exercise per week. Discussed medication compliance, adverse effects.  -Follow-up with primary provider in 1 month for management of chronic conditions. . -Follow a Healthy Eating Plan - You can do it! . Limit sugary drinks.  Avoid sodas, sweet tea,  sport or energy drinks, or fruit drinks.  Drink water, lo-fat milk, or diet drinks. . Limit snack foods.   Cut back on candy, cake, cookies, chips, ice cream.  These are a special treat, only in small amounts. . Eat plenty of vegetables.  Especially dark green, red, and orange vegetables. Aim for at least 3 servings a day. More is better! Include fruit in your daily diet.   Whole fruit is much healthier than fruit juice! . Limit "white" bread, "white" pasta, "white" rice.   Choose "100% whole grain" products, brown or wild rice. Marland Kitchen Avoid fatty meats. Try "Meatless Monday" and choose eggs or beans one day a week.  When eating meat, choose lean meats like chicken, [place], and fish.  Grill, broil, or bake meats instead of frying, and eat poultry without the skin. . Eat less salt.  Avoid frozen pizzas, frozen dinners and salty foods.  Use seasonings other than salt in cooking.  This can help blood pressure and keep you from swelling . Beer, wine and liquor have calories.  If you can safely drink alcohol, limit to 1 drink per day for women, 2 drinks for men  Patient was given the opportunity to ask questions. Patient verbalized understanding of the plan and was able to repeat key elements of the plan. Patient was given clear instructions to go to Emergency Department or return to medical center if symptoms don't improve, worsen, or new problems develop.The patient verbalized understanding.   Requested Prescriptions    No prescriptions requested or ordered in this encounter    Follow-Up with primary provider in 1 month or sooner if needed.   Camillia Herter, NP

## 2019-06-24 ENCOUNTER — Other Ambulatory Visit: Payer: Self-pay | Admitting: Physician Assistant

## 2019-06-24 ENCOUNTER — Telehealth: Payer: Self-pay

## 2019-06-24 DIAGNOSIS — Z8616 Personal history of COVID-19: Secondary | ICD-10-CM

## 2019-06-24 NOTE — Telephone Encounter (Signed)
Called patient's insurance to see if CPT [phone] needed prior authorization. Was informed that no PA was needed for outpatient imaging. Will call Central Scheduling to get appointment for CT.

## 2019-06-24 NOTE — Telephone Encounter (Signed)
Patient is scheduled for her CT scan at [facility] on 06/25/2019 at 3 PM. Patient can only have liquids starting at 12 PM.  Please notify her on appointment details.  If this date & time doesn't work for patient she can call [phone], option 2) to reschedule.

## 2019-06-25 ENCOUNTER — Observation Stay (HOSPITAL_COMMUNITY)
Admission: EM | Admit: 2019-06-25 | Discharge: 2019-06-26 | Disposition: A | Discharge status: Home / Self Care | Payer: 59 | Attending: Internal Medicine | Admitting: Internal Medicine

## 2019-06-25 ENCOUNTER — Other Ambulatory Visit: Payer: Self-pay

## 2019-06-25 ENCOUNTER — Other Ambulatory Visit: Payer: Self-pay | Admitting: Internal Medicine

## 2019-06-25 ENCOUNTER — Emergency Department (HOSPITAL_COMMUNITY): Payer: 59

## 2019-06-25 ENCOUNTER — Ambulatory Visit (HOSPITAL_COMMUNITY)
Admission: RE | Admit: 2019-06-25 | Discharge: 2019-06-25 | Disposition: A | Discharge status: Home / Self Care | Payer: 59 | Source: Ambulatory Visit | Attending: Family | Admitting: Family

## 2019-06-25 DIAGNOSIS — Z87891 Personal history of nicotine dependence: Secondary | ICD-10-CM | POA: Diagnosis not present

## 2019-06-25 DIAGNOSIS — R911 Solitary pulmonary nodule: Secondary | ICD-10-CM

## 2019-06-25 DIAGNOSIS — Z8616 Personal history of COVID-19: Secondary | ICD-10-CM | POA: Insufficient documentation

## 2019-06-25 DIAGNOSIS — Z79899 Other long term (current) drug therapy: Secondary | ICD-10-CM | POA: Insufficient documentation

## 2019-06-25 DIAGNOSIS — Z9049 Acquired absence of other specified parts of digestive tract: Secondary | ICD-10-CM | POA: Diagnosis not present

## 2019-06-25 DIAGNOSIS — I2699 Other pulmonary embolism without acute cor pulmonale: Secondary | ICD-10-CM | POA: Diagnosis present

## 2019-06-25 DIAGNOSIS — R0602 Shortness of breath: Secondary | ICD-10-CM

## 2019-06-25 DIAGNOSIS — Z886 Allergy status to analgesic agent status: Secondary | ICD-10-CM | POA: Insufficient documentation

## 2019-06-25 DIAGNOSIS — Z6841 Body Mass Index (BMI) 40.0 and over, adult: Secondary | ICD-10-CM | POA: Diagnosis not present

## 2019-06-25 DIAGNOSIS — Z9884 Bariatric surgery status: Secondary | ICD-10-CM | POA: Insufficient documentation

## 2019-06-25 DIAGNOSIS — I10 Essential (primary) hypertension: Secondary | ICD-10-CM | POA: Diagnosis not present

## 2019-06-25 DIAGNOSIS — I2693 Single subsegmental pulmonary embolism without acute cor pulmonale: Secondary | ICD-10-CM

## 2019-06-25 DIAGNOSIS — R928 Other abnormal and inconclusive findings on diagnostic imaging of breast: Secondary | ICD-10-CM

## 2019-06-25 DIAGNOSIS — Z20822 Contact with and (suspected) exposure to covid-19: Secondary | ICD-10-CM | POA: Diagnosis not present

## 2019-06-25 HISTORY — DX: Family history of ischemic heart disease and other diseases of the circulatory system: Z82.49

## 2019-06-25 LAB — COMPREHENSIVE METABOLIC PANEL
ALT: 20 U/L (ref 0–44)
AST: 24 U/L (ref 15–41)
Albumin: 4.1 g/dL (ref 3.5–5.0)
Alkaline Phosphatase: 75 U/L (ref 38–126)
Anion gap: 10 (ref 5–15)
BUN: 11 mg/dL (ref 6–20)
CO2: 26 mmol/L (ref 22–32)
Calcium: 9.6 mg/dL (ref 8.9–10.3)
Chloride: 105 mmol/L (ref 98–111)
Creatinine, Ser: 0.99 mg/dL (ref 0.44–1.00)
GFR calc Af Amer: >60 mL/min (ref >60)
GFR calc non Af Amer: >60 mL/min (ref >60)
Glucose, Bld: 94 mg/dL (ref 70–99)
Potassium: 3.7 mmol/L (ref 3.5–5.1)
Sodium: 141 mmol/L (ref 135–145)
Total Bilirubin: 0.6 mg/dL (ref 0.3–1.2)
Total Protein: 8 g/dL (ref 6.5–8.1)

## 2019-06-25 LAB — CBC WITH DIFFERENTIAL/PLATELET
Abs Immature Granulocytes: 0.02 10*3/uL (ref 0.00–0.07)
Basophils Absolute: 0 10*3/uL (ref 0.0–0.1)
Basophils Relative: 0 %
Eosinophils Absolute: 0.1 10*3/uL (ref 0.0–0.5)
Eosinophils Relative: 1 %
HCT: 44 % (ref 36.0–46.0)
Hemoglobin: 13.6 g/dL (ref 12.0–15.0)
Immature Granulocytes: 0 %
Lymphocytes Relative: 23 %
Lymphs Abs: 2.5 10*3/uL (ref 0.7–4.0)
MCH: 27.6 pg (ref 26.0–34.0)
MCHC: 30.9 g/dL (ref 30.0–36.0)
MCV: 89.4 fL (ref 80.0–100.0)
Monocytes Absolute: 0.9 10*3/uL (ref 0.1–1.0)
Monocytes Relative: 8 %
Neutro Abs: 7.3 10*3/uL (ref 1.7–7.7)
Neutrophils Relative %: 68 %
Platelets: 308 10*3/uL (ref 150–400)
RBC: 4.92 MIL/uL (ref 3.87–5.11)
RDW: 14.3 % (ref 11.5–15.5)
WBC: 10.8 10*3/uL — ABNORMAL HIGH (ref 4.0–10.5)
nRBC: 0 % (ref 0.0–0.2)

## 2019-06-25 LAB — APTT: aPTT: 32 seconds (ref 24–36)

## 2019-06-25 LAB — TROPONIN I (HIGH SENSITIVITY): Troponin I (High Sensitivity): 3 ng/L (ref <18)

## 2019-06-25 MED ORDER — IOHEXOL 350 MG/ML SOLN
100.0000 mL | Freq: Once | INTRAVENOUS | Status: AC | PRN
  Administered 2019-06-25: 100 mL via INTRAVENOUS

## 2019-06-25 MED ORDER — ENOXAPARIN SODIUM 80 MG/0.8ML ~~LOC~~ SOLN
1.0000 mg/kg | SUBCUTANEOUS | Status: AC
  Administered 2019-06-25: 160 mg via SUBCUTANEOUS
  Filled 2019-06-25: qty 1.6

## 2019-06-25 NOTE — Progress Notes (Signed)
ANTICOAGULATION CONSULT NOTE - Initial Consult  Pharmacy Consult for Enoxaparin Indication: pulmonary embolus  Allergies  Allergen Reactions  . Nsaids     History of gastric sleeve  . Other Other (See Comments)    Patient stated that she can only take chewable and liquid medication Patient says she can now swallow solid pills/capsules/caplets 06/25/2018    Patient Measurements: Height: 5\' 2"  (157.5 cm) Weight: (!) 348 lb (157.9 kg) IBW/kg (Calculated) : 50.1  Vital Signs: Temp: 98.8 F (37.1 C) (03/18 2038) Temp Source: Oral (03/18 2038) BP: 151/75 (03/19 0053) Pulse Rate: 79 (03/19 0053)  Labs: Recent Labs    06/25/19 2209  HGB 13.6  HCT 44.0  PLT 308  APTT 32  CREATININE 0.99  TROPONINIHS 3    Estimated Creatinine Clearance: 100 mL/min (by C-G formula based on SCr of 0.99 mg/dL).   Medical History: Past Medical History:  Diagnosis Date  . Arthritis    Left knee  . Hypertension   . PONV (postoperative nausea and vomiting)     Medications:  No anticoagulants PTA  Assessment: 49 y/oF who presented to [facility] ED on 06/25/2019 with chest pain, shortness of breath, and PE noted on outpatient CTa. LE venous dopplers ordered to r/o DVT. Pharmacy consulted for Enoxaparin dosing. SCr 0.99 with CrCl ~ 100 ml/min. Hgb and Pltc WNL.   Goal of Therapy:  Anti-Xa level 0.6-1 units/ml 4hrs after LMWH dose given Monitor platelets by anticoagulation protocol: Yes   Plan:  Enoxaparin 1mg /kg (160mg ) SQ q12h Monitor CBC at least q72h, renal function Monitor for s/sx of bleeding    Lindell Spar M 06/26/2019,1:13 AM

## 2019-06-25 NOTE — Progress Notes (Addendum)
06/25/2019 @ 18:50- Called patient to give update that she has blood clots in the lungs. Patient prefers to go to [facility] for treatment. Reports that she is going there tonight as I explained the urgency of immediate medical attention in order to help prevent adverse effects such as stroke. Patient intends to arrive by private vehicle.   06/25/2019 @ 18:57- I called report to the [facility] Emergency Department and gave report to charge nurse [place].

## 2019-06-25 NOTE — ED Provider Notes (Signed)
[facility] DEPT Provider Note   CSN: [id number] Arrival date & time: 06/25/19  1956     History Chief Complaint  Patient presents with  . Pulmonary Embolism    sent by PCP    Karen [name] is a 49 y.o. female.  HPI   Patient presents ED for evaluation of a pulmonary embolism.  Patient has been having difficulty with shortness of breath over the last several weeks.  Patient feels short of breath at rest and with activity.  She has had some leg cramping and has noticed some swelling in her legs.  Patient went to her doctor on the 16th.  She had an outpatient CT scan of her chest.  Patient was called today and was told she had a pulmonary embolism and needed to come to the emergency room.  Past Medical History:  Diagnosis Date  . Arthritis    Left knee  . Hypertension   . PONV (postoperative nausea and vomiting)     Patient Active Problem List   Diagnosis Date Noted  . Gallstone 06/25/2018  . Steatosis of liver 06/25/2018  . Carpal tunnel syndrome of right wrist 02/10/2018  . Essential hypertension 10/26/2016  . Menorrhagia 02/22/2015  . Morbid obesity ([place]) 11/10/2012    Past Surgical History:  Procedure Laterality Date  . CARPAL TUNNEL RELEASE Right 04/04/2018   Procedure: CARPAL TUNNEL RELEASE;  Surgeon: Latanya Maudlin, MD;  Location: [facility];  Service: Orthopedics;  Laterality: Right;  61min  . CHOLECYSTECTOMY    . gastic sleeve  2015  . HYSTEROSCOPY WITH NOVASURE N/A 03/29/2015   Procedure: HYSTEROSCOPY WITH NOVASURE;  Surgeon: Anastasio Auerbach, MD;  Location: [facility];  Service: Gynecology;  Laterality: N/A;  to follow first case around 10:15am.  Requests one hour OR time.  . INTRAUTERINE DEVICE INSERTION     X 2  Spontaneously extruded X 2.   2014/2015  . TUBAL LIGATION  approximately 2000   as an interval procedure historically     OB History    Gravida  2   Para  2   Term      Preterm      AB      Living          SAB      TAB      Ectopic      Multiple      Live Births              Family History  Problem Relation Age of Onset  . Hyperlipidemia Father   . Heart attack Father   . Heart attack Other   . Hypertension Other   . Cancer Other   . Stroke Other   . Hypertension Mother   . Parkinson's disease Mother     Social History   Tobacco Use  . Smoking status: Former Research scientist (life sciences)  . Smokeless tobacco: Never Used  Substance Use Topics  . Alcohol use: Yes    Comment: occas   . Drug use: No    Home Medications Prior to Admission medications   Medication Sig Start Date End Date Taking? Authorizing Provider  acetaminophen (TYLENOL) 500 MG tablet Take 500 mg by mouth every 6 (six) hours as needed for moderate pain.   Yes [provider]  albuterol (VENTOLIN HFA) 108 (90 Base) MCG/ACT inhaler Inhale 2 puffs into the lungs every 6 (six) hours as needed for wheezing or shortness of breath. 06/24/19  Yes Ladell Pier,  MD  amLODipine (NORVASC) 10 MG tablet Take 1 tablet (10 mg total) by mouth daily. 06/23/19  Yes Minette Brine, Amy J, NP  b complex vitamins tablet Take 1 tablet by mouth daily.   Yes [provider]  Calcium Carbonate-Vitamin D (CALCIUM-VITAMIN D) 600-125 MG-UNIT TABS Take 1 tablet by mouth daily.   Yes [provider]  Cholecalciferol (VITAMIN D-3) 25 MCG (1000 UT) CAPS Take 2,000 Units by mouth daily.   Yes [provider]  CONTRAVE 8-90 MG TB12 Take 2 tablets by mouth 2 (two) times daily. 06/17/19  Yes Blandford, Jannette Fogo, NP  gabapentin (NEURONTIN) 300 MG capsule Take 300 mg by mouth at bedtime as needed (pain).  05/27/18  Yes [provider]  Multiple Vitamins-Minerals (HAIR/SKIN/NAILS PO) Take 1 tablet by mouth daily.   Yes [provider]  Omega-3 Fatty Acids (FISH OIL PO) Take 1 capsule by mouth daily.   Yes [provider]  vitamin B-12 (CYANOCOBALAMIN) 1000 MCG tablet Take 5,000 mcg by mouth daily.    Yes [provider]  butalbital-acetaminophen-caffeine (FIORICET) 50-325-40 MG tablet Take 1-2 tablets by mouth every 8 (eight) hours as needed for headache. 06/23/19 06/22/20  Camillia Herter, NP  topiramate (TOPAMAX) 25 MG tablet Take 1 tablet (25 mg total) by mouth at bedtime. 06/23/19   Camillia Herter, NP    Allergies    Nsaids and Other  Review of Systems   Review of Systems  All other systems reviewed and are negative.   Physical Exam Updated Vital Signs BP 135/77   Pulse 89   Temp 98.8 F (37.1 C) (Oral)   Resp 19   Ht 1.575 m (5\' 2" )   Wt (!) 157.9 kg   SpO2 100%   BMI 63.65 kg/m   Physical Exam Vitals and nursing note reviewed.  Constitutional:      General: She is not in acute distress.    Appearance: She is well-developed. She is obese.  HENT:     Head: Normocephalic and atraumatic.     Right Ear: External ear normal.     Left Ear: External ear normal.  Eyes:     General: No scleral icterus.       Right eye: No discharge.        Left eye: No discharge.     Conjunctiva/sclera: Conjunctivae normal.  Neck:     Trachea: No tracheal deviation.  Cardiovascular:     Rate and Rhythm: Normal rate and regular rhythm.  Pulmonary:     Effort: Pulmonary effort is normal. No respiratory distress.     Breath sounds: Normal breath sounds. No stridor. No wheezing or rales.  Abdominal:     General: Bowel sounds are normal. There is no distension.     Palpations: Abdomen is soft.     Tenderness: There is no abdominal tenderness. There is no guarding or rebound.  Musculoskeletal:        General: No tenderness.     Cervical back: Neck supple.  Skin:    General: Skin is warm and dry.     Findings: No rash.  Neurological:     Mental Status: She is alert.     Cranial Nerves: No cranial nerve deficit (no facial droop, extraocular movements intact, no slurred speech).     Sensory: No sensory deficit.     Motor: No abnormal muscle tone or seizure activity.      Coordination: Coordination normal.     ED Results / Procedures /  Treatments   Labs (all labs ordered are listed, but only abnormal results are displayed) Labs Reviewed  CBC WITH DIFFERENTIAL/PLATELET - Abnormal; Notable for the following components:      Result Value   WBC 10.8 (*)    All other components within normal limits  APTT  COMPREHENSIVE METABOLIC PANEL  TROPONIN I (HIGH SENSITIVITY)  TROPONIN I (HIGH SENSITIVITY)    EKG None  Radiology CT Angio Chest W/Cm &/Or Wo Cm  Result Date: 06/25/2019 CLINICAL DATA:  Dyspnea. Dual COVID-19 diagnoses in November 2020 and February 2021. EXAM: CT ANGIOGRAPHY CHEST WITH CONTRAST TECHNIQUE: Multidetector CT imaging of the chest was performed using the standard protocol during bolus administration of intravenous contrast. Multiplanar CT image reconstructions and MIPs were obtained to evaluate the vascular anatomy. Automatic exposure control utilized. CONTRAST:  162mL OMNIPAQUE IOHEXOL 350 MG/ML SOLN COMPARISON:  None.  CT abdomen dated July 05, 2013. FINDINGS: Cardiovascular: Peripheral pulmonary embolism with nonocclusive central contrast filling defect at the bifurcation of the right lower lobe medial segmental branches. No central pulmonary embolism. RV/LV ratio 0.8; no right heart strain. Normal caliber of the thoracic aorta with cardiac motion at the root, but no aneurysmal dilatation or dissection or calcified atherosclerotic disease. Mild 3-vessel coronary calcification. Normal heart size. Mediastinum/Nodes: No adenopathy. Normal CT appearance of the esophagus and partially imaged thyroid. Lungs/Pleura: Minimal subpleural atelectasis. No pleural effusion or pneumonia. A 6 mm ovoid sub solid right middle lobe nodule fissural based along the minor fissure on axial series 5, image 39, of unknown stability. A 3 mm subsolid nodule in the right lower lobe on image 54, not significantly changed compared to the July 05, 2013 CT abdomen series 4,  image 5. Upper Abdomen: Remote gastric sleeve surgical sutures. Decompressed gastric lumen without an apparent abnormality. Mild hepatomegaly. Cholecystectomy. Stable mild thickening of the left adrenal gland. Normal right adrenal gland. Musculoskeletal: Minimal vertebral body degenerative endplate change. Review of the MIP images confirms the above findings. Critical Value/emergent results and recommended lung nodule surveillance imaging were called by telephone at the time of interpretation on 06/25/2019 at 5:50 pm to provider Dr. Karle Plumber, who verbally acknowledged these results. IMPRESSION: Peripheral pulmonary embolism, nonocclusive.  No right heart strain. A 6 mm right middle lobe subsolid fissural based nodule of unknown stability. Initial follow-up with CT at 6-12 months is recommended to confirm persistence. If persistent, repeat CT is recommended every 2 years until 5 years of stability has been established. This recommendation follows the consensus statement: Guidelines for Management of Incidental Pulmonary Nodules Detected on CT Images: From the Fleischner Society 2017; Radiology 2017; 284:228-243. Mild three-vessel coronary calcification. Electronically Signed   By: Revonda Humphrey   On: 06/25/2019 18:09    Procedures Procedures (including critical care time)  Medications Ordered in ED Medications  enoxaparin (LOVENOX) injection 160 mg (160 mg Subcutaneous Given 06/25/19 2252)    ED Course  I have reviewed the triage vital signs and the nursing notes.  Pertinent labs & imaging results that were available during my care of the patient were reviewed by me and considered in my medical decision making (see chart for details).  Clinical Course as of Jun 25 2346  Thu Jun 25, 2019  2346 Positive covid November 2020.   [JK]  2347 Labs reviewed.  No sig abnormalities.   [JK]    Clinical Course User Index [JK] Dorie Rank, MD   MDM Rules/Calculators/A&P  Pt  presented to the ED for treatment of a PE noted on outpt CT scan.  No signs of massive or submassive PE.  Will start on lovenox.  Consult with medical service for admission.  Final Clinical Impression(s) / ED Diagnoses Final diagnoses:  Single subsegmental pulmonary embolism without acute cor pulmonale ([place])  Lung nodule      Dorie Rank, MD 06/25/19 2348

## 2019-06-25 NOTE — ED Notes (Signed)
IV team at bedside 

## 2019-06-25 NOTE — ED Triage Notes (Signed)
Sent by PCP from home. Patient reports she had CT of chest today and was called by PCP to come to ED because she has blood clots in her lungs. Patient reports [facility] and that she has has tested positive for COVID twice (last May 21, 2019). Patient denies chest pain.

## 2019-06-26 ENCOUNTER — Ambulatory Visit (HOSPITAL_BASED_OUTPATIENT_CLINIC_OR_DEPARTMENT_OTHER): Payer: 59

## 2019-06-26 ENCOUNTER — Encounter (HOSPITAL_COMMUNITY): Payer: Self-pay | Admitting: Family Medicine

## 2019-06-26 DIAGNOSIS — R52 Pain, unspecified: Secondary | ICD-10-CM

## 2019-06-26 DIAGNOSIS — I2699 Other pulmonary embolism without acute cor pulmonale: Secondary | ICD-10-CM

## 2019-06-26 DIAGNOSIS — M7989 Other specified soft tissue disorders: Secondary | ICD-10-CM | POA: Diagnosis not present

## 2019-06-26 DIAGNOSIS — R911 Solitary pulmonary nodule: Secondary | ICD-10-CM

## 2019-06-26 HISTORY — DX: Other pulmonary embolism without acute cor pulmonale: I26.99

## 2019-06-26 HISTORY — DX: Solitary pulmonary nodule: R91.1

## 2019-06-26 LAB — BASIC METABOLIC PANEL
Anion gap: 9 (ref 5–15)
BUN: 13 mg/dL (ref 6–20)
CO2: 26 mmol/L (ref 22–32)
Calcium: 9.3 mg/dL (ref 8.9–10.3)
Chloride: 105 mmol/L (ref 98–111)
Creatinine, Ser: 0.66 mg/dL (ref 0.44–1.00)
GFR calc Af Amer: >60 mL/min (ref >60)
GFR calc non Af Amer: >60 mL/min (ref >60)
Glucose, Bld: 93 mg/dL (ref 70–99)
Potassium: 3.6 mmol/L (ref 3.5–5.1)
Sodium: 140 mmol/L (ref 135–145)

## 2019-06-26 LAB — CBC
HCT: 39.9 % (ref 36.0–46.0)
Hemoglobin: 12.4 g/dL (ref 12.0–15.0)
MCH: 27.6 pg (ref 26.0–34.0)
MCHC: 31.1 g/dL (ref 30.0–36.0)
MCV: 88.7 fL (ref 80.0–100.0)
Platelets: 265 10*3/uL (ref 150–400)
RBC: 4.5 MIL/uL (ref 3.87–5.11)
RDW: 14.3 % (ref 11.5–15.5)
WBC: 9.1 10*3/uL (ref 4.0–10.5)
nRBC: 0 % (ref 0.0–0.2)

## 2019-06-26 LAB — SARS CORONAVIRUS 2 (TAT 6-24 HRS): SARS Coronavirus 2: NEGATIVE

## 2019-06-26 LAB — BRAIN NATRIURETIC PEPTIDE: B Natriuretic Peptide: 33 pg/mL (ref 0.0–100.0)

## 2019-06-26 LAB — HIV ANTIBODY (ROUTINE TESTING W REFLEX): HIV Screen 4th Generation wRfx: NONREACTIVE

## 2019-06-26 LAB — TROPONIN I (HIGH SENSITIVITY): Troponin I (High Sensitivity): 3 ng/L (ref <18)

## 2019-06-26 MED ORDER — SODIUM CHLORIDE 0.9% FLUSH
3.0000 mL | Freq: Two times a day (BID) | INTRAVENOUS | Status: DC
  Administered 2019-06-26 (×2): 3 mL via INTRAVENOUS

## 2019-06-26 MED ORDER — APIXABAN (ELIQUIS) VTE STARTER PACK (10MG AND 5MG): ORAL_TABLET | ORAL | 0 refills | Status: DC

## 2019-06-26 MED ORDER — ENOXAPARIN SODIUM 300 MG/3ML IJ SOLN
1.0000 mg/kg | Freq: Two times a day (BID) | INTRAMUSCULAR | Status: DC
  Filled 2019-06-26: qty 1.6

## 2019-06-26 MED ORDER — GABAPENTIN 300 MG PO CAPS: 300.0000 mg | ORAL_CAPSULE | Freq: Every evening | ORAL | Status: DC | PRN

## 2019-06-26 MED ORDER — ONDANSETRON HCL 4 MG PO TABS: 4.0000 mg | ORAL_TABLET | Freq: Four times a day (QID) | ORAL | Status: DC | PRN

## 2019-06-26 MED ORDER — SODIUM CHLORIDE 0.9 % IV SOLN: 250.0000 mL | INTRAVENOUS | Status: DC | PRN

## 2019-06-26 MED ORDER — APIXABAN 5 MG PO TABS: 10.0000 mg | ORAL_TABLET | Freq: Two times a day (BID) | ORAL | Status: DC

## 2019-06-26 MED ORDER — SODIUM CHLORIDE 0.9% FLUSH: 3.0000 mL | INTRAVENOUS | Status: DC | PRN

## 2019-06-26 MED ORDER — ACETAMINOPHEN 325 MG PO TABS
650.0000 mg | ORAL_TABLET | Freq: Four times a day (QID) | ORAL | Status: DC | PRN
  Administered 2019-06-26 (×2): 650 mg via ORAL
  Filled 2019-06-26 (×2): qty 2

## 2019-06-26 MED ORDER — SENNOSIDES-DOCUSATE SODIUM 8.6-50 MG PO TABS: 1.0000 | ORAL_TABLET | Freq: Every evening | ORAL | Status: DC | PRN

## 2019-06-26 MED ORDER — ENOXAPARIN SODIUM 300 MG/3ML IJ SOLN
1.0000 mg/kg | Freq: Two times a day (BID) | INTRAMUSCULAR | Status: DC
  Administered 2019-06-26: 160 mg via SUBCUTANEOUS
  Filled 2019-06-26 (×2): qty 1.6

## 2019-06-26 MED ORDER — SODIUM CHLORIDE 0.9% FLUSH: 3.0000 mL | Freq: Two times a day (BID) | INTRAVENOUS | Status: DC

## 2019-06-26 MED ORDER — APIXABAN 5 MG PO TABS: 5.0000 mg | ORAL_TABLET | Freq: Two times a day (BID) | ORAL | Status: DC

## 2019-06-26 MED ORDER — HYDROCODONE-ACETAMINOPHEN 5-325 MG PO TABS
1.0000 | ORAL_TABLET | ORAL | Status: DC | PRN
  Administered 2019-06-26: 2 via ORAL
  Filled 2019-06-26: qty 2

## 2019-06-26 MED ORDER — ACETAMINOPHEN 650 MG RE SUPP: 650.0000 mg | Freq: Four times a day (QID) | RECTAL | Status: DC | PRN

## 2019-06-26 MED ORDER — ENOXAPARIN SODIUM 300 MG/3ML IJ SOLN: 1.0000 mg/kg | Freq: Two times a day (BID) | INTRAMUSCULAR | Status: DC

## 2019-06-26 MED ORDER — AMLODIPINE BESYLATE 5 MG PO TABS
10.0000 mg | ORAL_TABLET | Freq: Every day | ORAL | Status: DC
  Administered 2019-06-26: 10 mg via ORAL
  Filled 2019-06-26: qty 2

## 2019-06-26 MED ORDER — ONDANSETRON HCL 4 MG/2ML IJ SOLN: 4.0000 mg | Freq: Four times a day (QID) | INTRAMUSCULAR | Status: DC | PRN

## 2019-06-26 NOTE — Progress Notes (Signed)
Tele called saying pt. HR up to 150. Nurse went to assess pt. Pt. Up to bathroom. Nurse got vitals HR leveled out to 100-110. Pt. Stated she did feel the increased HR. MD notified. No new orders. Will continue to monitor.

## 2019-06-26 NOTE — Progress Notes (Signed)
Nurse provided incentive spirometer. Nurse reviewed it with pt. PT. Stated she used it before. 1st hour highest goal was 1600.

## 2019-06-26 NOTE — Discharge Summary (Addendum)
Physician Discharge Summary  Karen [name] [id number] DOB: [birth year]   PCP: Nicolette Bang, DO  Admit date: 06/25/2019 Discharge date: 06/26/2019 Length of Stay: 0 days   Code Status: Full Code  Admitted From:  Home Discharged to:   [place]:  None  Equipment/Devices:  None Discharge Condition:  Stable  Recommendations for Outpatient Follow-up   1. Follow up with PCP in 1 week 2. Started on Eliquis for 3 to 6 months for provoked PE in setting of recent COVID-19 infection.  Please follow-up outpatient  Hospital Summary  Karen [name] is a 49 y.o. female with a history of hypertension, morbid obesity, recent COVID-53 infection in November 2020 persistent shortness of breath who was admitted for acute on chronic shortness of breath with CTA chest positive outpatient for pulmonary embolus.  ED Course: Upon arrival to the ED, patient is found to be afebrile, saturating mid 90s on room air, mildly tachypneic, tachycardic in the low 120s, and with stable blood pressure.  EKG features sinus tachycardia.  Chemistry panel is unremarkable and CBC with mild leukocytosis.  Patient was started on Lovenox 1 mg/kg in the ED and hospitalist consulted for admission.   Bilateral lower extremity Doppler negative for DVT.  Discharged on Eliquis for submassive PE treatment for 3 to 6 months duration.  A & P   Principal Problem:   Acute pulmonary embolism (HCC) Active Problems:   Essential hypertension   Incidental lung nodule   1. Nonocclusive PE without cor pulmonale likely provoked from recent COVID-19 infection 1. Hemodynamically stable on room air 2. Bilateral lower extremity Doppler negative for DVT 3. Tolerating Lovenox while inpatient 4. Discharged on Eliquis for 3 to 6 months dosing and outpatient follow-up 2. Hypertension on Norvasc 3. Incidental subsolid lung nodule -repeat CT in 6 to 12 months   Consultants  . None  Procedures   . None  Antibiotics   Anti-infectives (From admission, onward)   None       Subjective  Patient seen and examined at bedside no acute distress and resting comfortably.  No events overnight.  Tolerating diet. In good spirits and anticipating discharge.  Gets tachycardic on exertion but otherwise tolerating room air.  Admits to some  pleuritic chest pain.  Otherwise shortness of breath, fever, nausea, vomiting, urinary or bowel complaints. Otherwise ROS negative    Objective   Discharge Exam: Vitals:   06/26/19 0946 06/26/19 1114  BP: 140/90 135/87  Pulse: 77 (!) 104  Resp: 16 17  Temp: 98.5 F (36.9 C)   SpO2: 98% 96%   Vitals:   06/26/19 0153 06/26/19 0604 06/26/19 0946 06/26/19 1114  BP: (!) 139/91 124/61 140/90 135/87  Pulse: 97 86 77 (!) 104  Resp: 18 20 16 17   Temp: 98.2 F (36.8 C) 97.8 F (36.6 C) 98.5 F (36.9 C)   TempSrc: Oral Oral Oral   SpO2: 95% 98% 98% 96%  Weight: (!) 158.3 kg     Height: 5\' 2"  (1.575 m)       Physical Exam Vitals and nursing note reviewed.  Constitutional:      Appearance: Normal appearance.  HENT:     Head: Normocephalic and atraumatic.  Eyes:     Conjunctiva/sclera: Conjunctivae normal.  Cardiovascular:     Rate and Rhythm: Normal rate and regular rhythm.  Pulmonary:     Effort: Pulmonary effort is normal.     Breath sounds: Normal breath sounds.  Abdominal:     General: Abdomen  is flat.     Palpations: Abdomen is soft.  Musculoskeletal:        General: No swelling or tenderness.  Skin:    Coloration: Skin is not jaundiced or pale.  Neurological:     Mental Status: She is alert. Mental status is at baseline.  Psychiatric:        Mood and Affect: Mood normal.        Behavior: Behavior normal.       The results of significant diagnostics from this hospitalization (including imaging, microbiology, ancillary and laboratory) are listed below for reference.     Microbiology: Recent Results (from the past 240  hour(s))  SARS CORONAVIRUS 2 (TAT 6-24 HRS) Nasopharyngeal Nasopharyngeal Swab     Status: None   Collection Time: 06/26/19 12:48 AM   Specimen: Nasopharyngeal Swab  Result Value Ref Range Status   SARS Coronavirus 2 NEGATIVE NEGATIVE Final    Comment: (NOTE) SARS-CoV-2 target nucleic acids are NOT DETECTED. The SARS-CoV-2 RNA is generally detectable in upper and lower respiratory specimens during the acute phase of infection. Negative results do not preclude SARS-CoV-2 infection, do not rule out co-infections with other pathogens, and should not be used as the sole basis for treatment or other patient management decisions. Negative results must be combined with clinical observations, patient history, and epidemiological information. The expected result is Negative. Fact Sheet for Patients: SugarRoll.be Fact Sheet for Healthcare Providers: https://www.woods-mathews.com/ This test is not yet approved or cleared by the [place] FDA and  has been authorized for detection and/or diagnosis of SARS-CoV-2 by FDA under an Emergency Use Authorization (EUA). This EUA will remain  in effect (meaning this test can be used) for the duration of the COVID-19 declaration under Section 56 4(b)(1) of the Act, 21 U.S.C. section 360bbb-3(b)(1), unless the authorization is terminated or revoked sooner. Performed at [facility], [place] [address], [place], [place] [address]      Labs: BNP (last 3 results) Recent Labs    06/26/19 0048  BNP 123456   Basic Metabolic Panel: Recent Labs  Lab 06/25/19 2209 06/26/19 0421  NA 141 140  K 3.7 3.6  CL 105 105  CO2 26 26  GLUCOSE 94 93  BUN 11 13  CREATININE 0.99 0.66  CALCIUM 9.6 9.3   Liver Function Tests: Recent Labs  Lab 06/25/19 2209  AST 24  ALT 20  ALKPHOS 75  BILITOT 0.6  PROT 8.0  ALBUMIN 4.1   No results for input(s): LIPASE, AMYLASE in the last 168 hours. No results for  input(s): AMMONIA in the last 168 hours. CBC: Recent Labs  Lab 06/25/19 2209 06/26/19 0421  WBC 10.8* 9.1  NEUTROABS 7.3  --   HGB 13.6 12.4  HCT 44.0 39.9  MCV 89.4 88.7  PLT 308 265   Cardiac Enzymes: No results for input(s): CKTOTAL, CKMB, CKMBINDEX, TROPONINI in the last 168 hours. BNP: Invalid input(s): POCBNP CBG: No results for input(s): GLUCAP in the last 168 hours. D-Dimer No results for input(s): DDIMER in the last 72 hours. Hgb A1c No results for input(s): HGBA1C in the last 72 hours. Lipid Profile No results for input(s): CHOL, HDL, LDLCALC, TRIG, CHOLHDL, LDLDIRECT in the last 72 hours. Thyroid function studies No results for input(s): TSH, T4TOTAL, T3FREE, THYROIDAB in the last 72 hours.  Invalid input(s): FREET3 Anemia work up No results for input(s): VITAMINB12, FOLATE, FERRITIN, TIBC, IRON, RETICCTPCT in the last 72 hours. Urinalysis    Component Value Date/Time  COLORURINE YELLOW 07/05/2013 [place] 07/05/2013 0531   LABSPEC >1.046 (H) 07/05/2013 0531   PHURINE 6.0 07/05/2013 0531   GLUCOSEU NEGATIVE 07/05/2013 0531   HGBUR NEGATIVE 07/05/2013 0531   BILIRUBINUR NEGATIVE 07/05/2013 0531   KETONESUR NEGATIVE 07/05/2013 0531   PROTEINUR NEGATIVE 07/05/2013 0531   UROBILINOGEN 0.2 07/05/2013 0531   NITRITE NEGATIVE 07/05/2013 0531   LEUKOCYTESUR NEGATIVE 07/05/2013 0531   Sepsis Labs Invalid input(s): PROCALCITONIN,  WBC,  LACTICIDVEN Microbiology Recent Results (from the past 240 hour(s))  SARS CORONAVIRUS 2 (TAT 6-24 HRS) Nasopharyngeal Nasopharyngeal Swab     Status: None   Collection Time: 06/26/19 12:48 AM   Specimen: Nasopharyngeal Swab  Result Value Ref Range Status   SARS Coronavirus 2 NEGATIVE NEGATIVE Final    Comment: (NOTE) SARS-CoV-2 target nucleic acids are NOT DETECTED. The SARS-CoV-2 RNA is generally detectable in upper and lower respiratory specimens during the acute phase of infection. Negative results do  not preclude SARS-CoV-2 infection, do not rule out co-infections with other pathogens, and should not be used as the sole basis for treatment or other patient management decisions. Negative results must be combined with clinical observations, patient history, and epidemiological information. The expected result is Negative. Fact Sheet for Patients: SugarRoll.be Fact Sheet for Healthcare Providers: https://www.woods-mathews.com/ This test is not yet approved or cleared by the [place] FDA and  has been authorized for detection and/or diagnosis of SARS-CoV-2 by FDA under an Emergency Use Authorization (EUA). This EUA will remain  in effect (meaning this test can be used) for the duration of the COVID-19 declaration under Section 56 4(b)(1) of the Act, 21 U.S.C. section 360bbb-3(b)(1), unless the authorization is terminated or revoked sooner. Performed at [facility], [place] [address], [place],  [address]     Discharge Instructions     Discharge Instructions    Diet - low sodium heart healthy   Complete by: As directed    Discharge instructions   Complete by: As directed    You were seen and examined in the hospital for blood clot in your lungs and cared for by a hospitalist.   Upon Discharge:  - start taking Eliquis as prescribed which is a blood thinner and puts you at risk for bleeding. Monitor your stool for blood or black tarry color as this can be a bleed. Make sure you be careful not to fall as this puts you at higher risk of bleeding as well. If this occurs, contact your primary care physician or return to the ED.  Request that your primary physician go over all hospital tests and procedures/radiological results at the follow up.   Please get all hospital records sent to your physician by signing a hospital release before you go home.   Read the complete instructions along with all the possible side effects for all  the medicines you take and that have been prescribed to you. Take any new medicines after you have completely understood and accept all the possible adverse reactions/side effects.   If you have any questions about your discharge medications or the care you received while you were in the hospital, you can call the unit and asked to speak with the hospitalist on call. Once you are discharged, your primary care physician will handle any further medical issues. Please note that NO REFILLS for any discharge medications will be authorized, as it is imperative that you return to your primary care physician (or establish a relationship with a  primary care physician if you do not have one) for your aftercare needs so that they can reassess your need for medications and monitor your lab values.   Do not drive, operate heavy machinery, perform activities at heights, swimming or participation in water activities or provide baby sitting services if your were admitted for loss of consciousness/seizures or if you are on sedating medications including, but not limited to benzodiazepines, sleep medications, narcotic pain medications, etc., until you have been cleared to do so by a medical doctor.   Do not take more than prescribed medications.   Wear a seat belt while driving.  If you have smoked or chewed Tobacco in the last 2 years please stop smoking; also stop any regular Alcohol and/or any Recreational drug use including marijuana.  If you experience worsening of your admission symptoms or develop shortness of breath, chest pain, suicidal or homicidal thoughts or experience a life threatening emergency, you must seek medical attention immediately by calling 911 or calling your PCP immediately.   Incentive spirometry RT   Complete by: As directed    Increase activity slowly   Complete by: As directed      Allergies as of 06/26/2019      Reactions   Nsaids    History of gastric sleeve   Other Other (See  Comments)   Patient stated that she can only take chewable and liquid medication Patient says she can now swallow solid pills/capsules/caplets 06/25/2018      Medication List    TAKE these medications   acetaminophen 500 MG tablet Commonly known as: TYLENOL Take 500 mg by mouth every 6 (six) hours as needed for moderate pain.   albuterol 108 (90 Base) MCG/ACT inhaler Commonly known as: VENTOLIN HFA Inhale 2 puffs into the lungs every 6 (six) hours as needed for wheezing or shortness of breath.   amLODipine 10 MG tablet Commonly known as: NORVASC Take 1 tablet (10 mg total) by mouth daily.   Apixaban Starter Pack 5 MG Tbpk Commonly known as: ELIQUIS STARTER PACK Take as directed on package: start with two-5mg  tablets twice daily for 7 days. On day 8, switch to one-5mg  tablet twice daily.   b complex vitamins tablet Take 1 tablet by mouth daily.   butalbital-acetaminophen-caffeine 50-325-40 MG tablet Commonly known as: FIORICET Take 1-2 tablets by mouth every 8 (eight) hours as needed for headache.   Calcium-Vitamin D 600-125 MG-UNIT Tabs Take 1 tablet by mouth daily.   Contrave 8-90 MG Tb12 Generic drug: Naltrexone-buPROPion HCl ER Take 2 tablets by mouth 2 (two) times daily.   FISH OIL PO Take 1 capsule by mouth daily.   gabapentin 300 MG capsule Commonly known as: NEURONTIN Take 300 mg by mouth at bedtime as needed (pain).   HAIR/SKIN/NAILS PO Take 1 tablet by mouth daily.   topiramate 25 MG tablet Commonly known as: Topamax Take 1 tablet (25 mg total) by mouth at bedtime.   vitamin B-12 1000 MCG tablet Commonly known as: CYANOCOBALAMIN Take 5,000 mcg by mouth daily.   Vitamin D-3 25 MCG (1000 UT) Caps Take 2,000 Units by mouth daily.       Allergies  Allergen Reactions  . Nsaids     History of gastric sleeve  . Other Other (See Comments)    Patient stated that she can only take chewable and liquid medication Patient says she can now swallow solid  pills/capsules/caplets 06/25/2018    Time coordinating discharge: Over 30 minutes   SIGNED:  Harold Hedge, D.O. [facility] Pager: [phone]  06/26/2019, 1:15 PM

## 2019-06-26 NOTE — Progress Notes (Signed)
Lower extremity venous has been completed.   Preliminary results in CV Proc.   Abram Sander 06/26/2019 9:10 AM

## 2019-06-26 NOTE — H&P (Signed)
History and Physical    Karen [name] [id number] DOB: 04/10/1970 DOA: 06/25/2019  PCP: Nicolette Bang, DO   Patient coming from: Home   Chief Complaint: Chest pain, SOB, PE on outpatient CTA   HPI: Karen [name] is a 49 y.o. female with medical history significant for hypertension, BMI 64, and COVID-19 infection and November 2020, now presenting to the emergency department with chest pain, shortness of breath, and PE noted on outpatient CTA.  Patient reports that she had COVID-19 infection in November, continued to have shortness of breath, and was retested last month and continued to test positive though she was not having any fevers at that point or other viral symptoms.  With persistent shortness of breath and now chest pain, she was seen in an outpatient clinic and sent for CTA chest.  Study was positive for PE and she was directed to the ED for further evaluation and management of this.  Patient denies any history of VTE, does not smoke, has not had any recent surgery, or prolonged immobilization.  Chest pain has been mainly on the left, but she has not noted any alleviating or exacerbating factors for this.  She has had some right leg swelling and tenderness recently.  Denies any history of bleeding.  ED Course: Upon arrival to the ED, patient is found to be afebrile, saturating mid 90s on room air, mildly tachypneic, tachycardic in the low 120s, and with stable blood pressure.  EKG features sinus tachycardia.  Chemistry panel is unremarkable and CBC with mild leukocytosis.  Patient was started on Lovenox 1 mg/kg in the ED and hospitalist consulted for admission.   Review of Systems:  All other systems reviewed and apart from HPI, are negative.  Past Medical History:  Diagnosis Date   Arthritis    Left knee   Hypertension    PONV (postoperative nausea and vomiting)     Past Surgical History:  Procedure Laterality Date   CARPAL TUNNEL RELEASE Right  04/04/2018   Procedure: CARPAL TUNNEL RELEASE;  Surgeon: Latanya Maudlin, MD;  Location: [facility];  Service: Orthopedics;  Laterality: Right;  49min   CHOLECYSTECTOMY     gastic sleeve  2015   HYSTEROSCOPY WITH NOVASURE N/A 03/29/2015   Procedure: HYSTEROSCOPY WITH NOVASURE;  Surgeon: Anastasio Auerbach, MD;  Location: [facility];  Service: Gynecology;  Laterality: N/A;  to follow first case around 10:15am.  Requests one hour OR time.   INTRAUTERINE DEVICE INSERTION     X 2  Spontaneously extruded X 2.   2014/2015   TUBAL LIGATION  approximately 2000   as an interval procedure historically     reports that she has quit smoking. She has never used smokeless tobacco. She reports current alcohol use. She reports that she does not use drugs.  Allergies  Allergen Reactions   Nsaids     History of gastric sleeve   Other Other (See Comments)    Patient stated that she can only take chewable and liquid medication Patient says she can now swallow solid pills/capsules/caplets 06/25/2018    Family History  Problem Relation Age of Onset   Hyperlipidemia Father    Heart attack Father    Heart attack Other    Hypertension Other    Cancer Other    Stroke Other    Hypertension Mother    Parkinson's disease Mother      Prior to Admission medications   Medication Sig Start Date End Date Taking? Authorizing Provider  acetaminophen (TYLENOL) 500 MG tablet Take 500 mg by mouth every 6 (six) hours as needed for moderate pain.   Yes [provider]  albuterol (VENTOLIN HFA) 108 (90 Base) MCG/ACT inhaler Inhale 2 puffs into the lungs every 6 (six) hours as needed for wheezing or shortness of breath. 06/24/19  Yes Ladell Pier, MD  amLODipine (NORVASC) 10 MG tablet Take 1 tablet (10 mg total) by mouth daily. 06/23/19  Yes Minette Brine, Amy J, NP  b complex vitamins tablet Take 1 tablet by mouth daily.   Yes [provider]  Calcium Carbonate-Vitamin D (CALCIUM-VITAMIN D)  600-125 MG-UNIT TABS Take 1 tablet by mouth daily.   Yes [provider]  Cholecalciferol (VITAMIN D-3) 25 MCG (1000 UT) CAPS Take 2,000 Units by mouth daily.   Yes [provider]  CONTRAVE 8-90 MG TB12 Take 2 tablets by mouth 2 (two) times daily. 06/17/19  Yes Blandford, Jannette Fogo, NP  gabapentin (NEURONTIN) 300 MG capsule Take 300 mg by mouth at bedtime as needed (pain).  05/27/18  Yes [provider]  Multiple Vitamins-Minerals (HAIR/SKIN/NAILS PO) Take 1 tablet by mouth daily.   Yes [provider]  Omega-3 Fatty Acids (FISH OIL PO) Take 1 capsule by mouth daily.   Yes [provider]  vitamin B-12 (CYANOCOBALAMIN) 1000 MCG tablet Take 5,000 mcg by mouth daily.   Yes [provider]  butalbital-acetaminophen-caffeine (FIORICET) 50-325-40 MG tablet Take 1-2 tablets by mouth every 8 (eight) hours as needed for headache. 06/23/19 06/22/20  Camillia Herter, NP  topiramate (TOPAMAX) 25 MG tablet Take 1 tablet (25 mg total) by mouth at bedtime. 06/23/19   Camillia Herter, NP    Physical Exam: Vitals:   06/25/19 2200 06/25/19 2215 06/25/19 2230 06/25/19 2245  BP: 135/82 (!) 141/89 135/77 135/77  Pulse: 84 84 82 89  Resp: 16 19 (!) 22 19  Temp:      TempSrc:      SpO2: 99% 98% 98% 100%  Weight:      Height:         Constitutional: NAD, calm  Eyes: PERTLA, lids and conjunctivae normal ENMT: Mucous membranes are moist. Posterior pharynx clear of any exudate or lesions.   Neck: normal, supple, no masses, no thyromegaly Respiratory:  no wheezing, no crackles. No accessory muscle use.  Cardiovascular: Rate ~120 and regular. No extremity edema. Abdomen: No distension, no tenderness, soft. Bowel sounds active.  Musculoskeletal: no clubbing / cyanosis. No joint deformity upper and lower extremities.   Skin: no significant rashes, lesions, ulcers. Warm, dry, well-perfused. Neurologic: No facial asymmetry. Sensation intact. Moving all extremities.   Psychiatric: Alert and oriented, appropriate throughout interview and exam. Pleasant and cooperative.    Labs and Imaging on Admission: I have personally reviewed following labs and imaging studies  CBC: Recent Labs  Lab 06/25/19 2209  WBC 10.8*  NEUTROABS 7.3  HGB 13.6  HCT 44.0  MCV 89.4  PLT [id number]   Basic Metabolic Panel: Recent Labs  Lab 06/25/19 2209  NA 141  K 3.7  CL 105  CO2 26  GLUCOSE 94  BUN 11  CREATININE 0.99  CALCIUM 9.6   GFR: Estimated Creatinine Clearance: 100 mL/min (by C-G formula based on SCr of 0.99 mg/dL). Liver Function Tests: Recent Labs  Lab 06/25/19 2209  AST 24  ALT 20  ALKPHOS 75  BILITOT 0.6  PROT 8.0  ALBUMIN 4.1   No results for input(s): LIPASE, AMYLASE in the last 168  hours. No results for input(s): AMMONIA in the last 168 hours. Coagulation Profile: No results for input(s): INR, PROTIME in the last 168 hours. Cardiac Enzymes: No results for input(s): CKTOTAL, CKMB, CKMBINDEX, TROPONINI in the last 168 hours. BNP (last 3 results) No results for input(s): PROBNP in the last 8760 hours. HbA1C: No results for input(s): HGBA1C in the last 72 hours. CBG: No results for input(s): GLUCAP in the last 168 hours. Lipid Profile: No results for input(s): CHOL, HDL, LDLCALC, TRIG, CHOLHDL, LDLDIRECT in the last 72 hours. Thyroid Function Tests: No results for input(s): TSH, T4TOTAL, FREET4, T3FREE, THYROIDAB in the last 72 hours. Anemia Panel: No results for input(s): VITAMINB12, FOLATE, FERRITIN, TIBC, IRON, RETICCTPCT in the last 72 hours. Urine analysis:    Component Value Date/Time   COLORURINE YELLOW 07/05/2013 0531   APPEARANCEUR CLEAR 07/05/2013 0531   LABSPEC >1.046 (H) 07/05/2013 0531   PHURINE 6.0 07/05/2013 0531   GLUCOSEU NEGATIVE 07/05/2013 0531   HGBUR NEGATIVE 07/05/2013 0531   BILIRUBINUR NEGATIVE 07/05/2013 0531   KETONESUR NEGATIVE 07/05/2013 0531   PROTEINUR NEGATIVE 07/05/2013 0531   UROBILINOGEN 0.2  07/05/2013 0531   NITRITE NEGATIVE 07/05/2013 0531   LEUKOCYTESUR NEGATIVE 07/05/2013 0531   Sepsis Labs: @LABRCNTIP (procalcitonin:4,lacticidven:4) )No results found for this or any previous visit (from the past 240 hour(s)).   Radiological Exams on Admission: CT Angio Chest W/Cm &/Or Wo Cm  Result Date: 06/25/2019 CLINICAL DATA:  Dyspnea. Dual COVID-19 diagnoses in November 2020 and February 2021. EXAM: CT ANGIOGRAPHY CHEST WITH CONTRAST TECHNIQUE: Multidetector CT imaging of the chest was performed using the standard protocol during bolus administration of intravenous contrast. Multiplanar CT image reconstructions and MIPs were obtained to evaluate the vascular anatomy. Automatic exposure control utilized. CONTRAST:  116mL OMNIPAQUE IOHEXOL 350 MG/ML SOLN COMPARISON:  None.  CT abdomen dated July 05, 2013. FINDINGS: Cardiovascular: Peripheral pulmonary embolism with nonocclusive central contrast filling defect at the bifurcation of the right lower lobe medial segmental branches. No central pulmonary embolism. RV/LV ratio 0.8; no right heart strain. Normal caliber of the thoracic aorta with cardiac motion at the root, but no aneurysmal dilatation or dissection or calcified atherosclerotic disease. Mild 3-vessel coronary calcification. Normal heart size. Mediastinum/Nodes: No adenopathy. Normal CT appearance of the esophagus and partially imaged thyroid. Lungs/Pleura: Minimal subpleural atelectasis. No pleural effusion or pneumonia. A 6 mm ovoid sub solid right middle lobe nodule fissural based along the minor fissure on axial series 5, image 39, of unknown stability. A 3 mm subsolid nodule in the right lower lobe on image 54, not significantly changed compared to the July 05, 2013 CT abdomen series 4, image 5. Upper Abdomen: Remote gastric sleeve surgical sutures. Decompressed gastric lumen without an apparent abnormality. Mild hepatomegaly. Cholecystectomy. Stable mild thickening of the left adrenal  gland. Normal right adrenal gland. Musculoskeletal: Minimal vertebral body degenerative endplate change. Review of the MIP images confirms the above findings. Critical Value/emergent results and recommended lung nodule surveillance imaging were called by telephone at the time of interpretation on 06/25/2019 at 5:50 pm to provider Dr. Karle Plumber, who verbally acknowledged these results. IMPRESSION: Peripheral pulmonary embolism, nonocclusive.  No right heart strain. A 6 mm right middle lobe subsolid fissural based nodule of unknown stability. Initial follow-up with CT at 6-12 months is recommended to confirm persistence. If persistent, repeat CT is recommended every 2 years until 5 years of stability has been established. This recommendation follows the consensus statement: Guidelines for Management of Incidental Pulmonary Nodules Detected on  CT Images: From the Fleischner Society 2017; Radiology 2017; [phone]. Mild three-vessel coronary calcification. Electronically Signed   By: Revonda Humphrey   On: 06/25/2019 18:09    EKG: Independently reviewed. Sinus tachycardia, rate 102.   Assessment/Plan   1. Pulmonary embolism  - She presents with PE on outpatient CTA chest that was ordered due to DOE and chest pain  - Likely hypercoagulable due to recent COVID infection  - PESI class II due to HR 120s in ED but BP and oxygenation have been stable   - She denies history of bleeding of was started on Lovenox 1mg /kg in ED  - She reports LLE pain and swelling, will check venous US  - Check cardiac enzymes, consider echo if elevated  - Continue anticoagulation, pain-control, consider transition to oral anticoagulation tomorrow if enzymes normal and there is not heavy clot burden on LE dopplers    2. Hypertension  - Continue  Norvasc as tolerated    3. Lung nodule  - Noted incidentally on CT; repeat CT in 6-12 months recommended    DVT prophylaxis: Lovenox 1 mg/kg  Code Status: Full  Family  Communication: Discussed with patient  Disposition Plan: Likely home with oral anticoagulant in 1-2 days if no heavy clot burden on LE venous US. Cardiac enzymes pending, should consider echo if enzymes elevated  Consults called: None  Admission status: Observation     Vianne Bulls, MD [facility] Pager: See [email or web address]  If 7AM-7PM, please contact the daytime attending [email or web address]  06/26/2019, 12:30 AM

## 2019-06-26 NOTE — Discharge Instructions (Signed)
Information on my medicine - ELIQUIS (apixaban)  This medication education was reviewed with me or my healthcare representative as part of my discharge preparation.    Why was Eliquis prescribed for you? Eliquis was prescribed to treat blood clots that may have been found in the veins of your legs (deep vein thrombosis) or in your lungs (pulmonary embolism) and to reduce the risk of them occurring again.  What do You need to know about Eliquis ? The starting dose is 10 mg (two 5 mg tablets) taken TWICE daily for the FIRST SEVEN (7) DAYS, then on 07/03/19  the dose is reduced to ONE 5 mg tablet taken TWICE daily.  Eliquis may be taken with or without food.   Try to take the dose about the same time in the morning and in the evening. If you have difficulty swallowing the tablet whole please discuss with your pharmacist how to take the medication safely.  Take Eliquis exactly as prescribed and DO NOT stop taking Eliquis without talking to the doctor who prescribed the medication.  Stopping may increase your risk of developing a new blood clot.  Refill your prescription before you run out.  After discharge, you should have regular check-up appointments with your healthcare provider that is prescribing your Eliquis.    What do you do if you miss a dose? If a dose of ELIQUIS is not taken at the scheduled time, take it as soon as possible on the same day and twice-daily administration should be resumed. The dose should not be doubled to make up for a missed dose.  Important Safety Information A possible side effect of Eliquis is bleeding. You should call your healthcare provider right away if you experience any of the following: ? Bleeding from an injury or your nose that does not stop. ? Unusual colored urine (red or dark brown) or unusual colored stools (red or black). ? Unusual bruising for unknown reasons. ? A serious fall or if you hit your head (even if there is no bleeding).  Some  medicines may interact with Eliquis and might increase your risk of bleeding or clotting while on Eliquis. To help avoid this, consult your healthcare provider or pharmacist prior to using any new prescription or non-prescription medications, including herbals, vitamins, non-steroidal anti-inflammatory drugs (NSAIDs) and supplements.  This website has more information on Eliquis (apixaban): http://www.eliquis.com/eliquis/home

## 2019-06-26 NOTE — TOC Benefit Eligibility Note (Signed)
Transition of Care [facility] Benefit Eligibility Note    Patient Details  Name: Karen [name] MRN: [id number] Date of Birth: [birth year]   Medication/Dose: Eliquis 10 mg 2 x a day requires prior auth [id number] plan only covers 5m 2 x day and Xarelto 15 mg 2 x day  Covered?: Yes  Tier: 3 Drug  Prescription Coverage Preferred Pharmacy: local pharmacy  Spoke with Person/Company/Phone Number:: [employer] [phone] Co-Pay: 0 for both Eliquis and Xarelto  Prior Approval: No  Deductible: Met       FKerin SalenPhone Number: 06/26/2019, 10:56 AM

## 2019-06-26 NOTE — ED Notes (Signed)
If the patient needs a bariatric bed please ask MD for order and then let Tori (RN on [address] [facility]) know.  Also, patient was Covid Positive in  November 2020, is patient on Contact? Thank you

## 2019-06-29 ENCOUNTER — Ambulatory Visit (INDEPENDENT_AMBULATORY_CARE_PROVIDER_SITE_OTHER): Payer: 59 | Admitting: Internal Medicine

## 2019-06-29 ENCOUNTER — Other Ambulatory Visit: Payer: 59

## 2019-06-29 ENCOUNTER — Ambulatory Visit
Admission: RE | Admit: 2019-06-29 | Discharge: 2019-06-29 | Disposition: A | Discharge status: Home / Self Care | Payer: 59 | Source: Ambulatory Visit | Attending: Internal Medicine | Admitting: Internal Medicine

## 2019-06-29 ENCOUNTER — Other Ambulatory Visit: Payer: Self-pay

## 2019-06-29 ENCOUNTER — Telehealth: Payer: Self-pay

## 2019-06-29 ENCOUNTER — Encounter: Payer: Self-pay | Admitting: Internal Medicine

## 2019-06-29 VITALS — BP 136/78 | HR 107 | Ht 62.0 in | Wt 349.0 lb

## 2019-06-29 DIAGNOSIS — I2699 Other pulmonary embolism without acute cor pulmonale: Secondary | ICD-10-CM | POA: Diagnosis not present

## 2019-06-29 DIAGNOSIS — R0602 Shortness of breath: Secondary | ICD-10-CM

## 2019-06-29 DIAGNOSIS — R928 Other abnormal and inconclusive findings on diagnostic imaging of breast: Secondary | ICD-10-CM

## 2019-06-29 NOTE — Patient Instructions (Signed)
Medication Instructions:  No changes today *If you need a refill on your cardiac medications before your next appointment, please call your pharmacy*   Lab Work: Today: lipids, bmet, bnp  If you have labs (blood work) drawn today and your tests are completely normal, you will receive your results only by: Marland Kitchen MyChart Message (if you have MyChart) OR . A paper copy in the mail If you have any lab test that is abnormal or we need to change your treatment, we will call you to review the results.   Testing/Procedures: Your physician has requested that you have an echocardiogram. Echocardiography is a painless test that uses sound waves to create images of your heart. It provides your doctor with information about the size and shape of your heart and how well your heart's chambers and valves are working. This procedure takes approximately one hour. There are no restrictions for this procedure.   Other Instructions

## 2019-06-29 NOTE — Telephone Encounter (Signed)
Transition Care Management Follow-up Telephone Call Date of discharge and from where: 06/26/2019, [facility]    Call placed to the patient # [phone], message left with call back requested to this CM   Patient has an appointment with Dr Juleen China 07/24/2019.

## 2019-06-29 NOTE — Progress Notes (Signed)
Cardiology Office Note   Date:  06/29/2019   ID:  Karen [name], DOB Sep 07, 1970, MRN [id number]  PCP:  Nicolette Bang, DO  Cardiologist:   Dorris Carnes, MD   Pt referred by Durene Fruits for chest pressure, SOB    History of Present Illness: Karen [name] is a 49 y.o. female who is followed by Durene Fruits   She has a hx of HTN, morbid obesity.   She said she felt OK until November 2020 when she developed COVID  Was SOB   This got worse in Dec, Jan   Improved some in Feb and then got worse at end of Feb 2021  SOB was at rest and with exertion.   WIth this SOB she developed chest tightness   A heaviness in top part of L chest that radiated to shoulder, L arm      When she would sit up or move around it would go away   [facility] would also take Tylenol for the pain  This would help some    The pt went to ED in March with persistent SOB   CTA done and it was + for PE   Found to be + again for COVID  LE dopplers were negative for DVT   She was discharged on Eliqus    Since discharge she has continued to have SOB and chest tightness   No presyncope/syncope    Pt has strong FHX of CAD; father had Hx of early CAD   Current Meds  Medication Sig  . acetaminophen (TYLENOL) 500 MG tablet Take 500 mg by mouth every 6 (six) hours as needed for moderate pain.  Marland Kitchen albuterol (VENTOLIN HFA) 108 (90 Base) MCG/ACT inhaler Inhale 2 puffs into the lungs every 6 (six) hours as needed for wheezing or shortness of breath.  Marland Kitchen amLODipine (NORVASC) 10 MG tablet Take 1 tablet (10 mg total) by mouth daily.  Marland Kitchen Apixaban Starter Pack (ELIQUIS STARTER PACK) 5 MG TBPK Take as directed on package: start with two-5mg  tablets twice daily for 7 days. On day 8, switch to one-5mg  tablet twice daily.  Marland Kitchen b complex vitamins tablet Take 1 tablet by mouth daily.  . butalbital-acetaminophen-caffeine (FIORICET) 50-325-40 MG tablet Take 1-2 tablets by mouth every 8 (eight) hours as needed for headache.  . Calcium  Carbonate-Vitamin D (CALCIUM-VITAMIN D) 600-125 MG-UNIT TABS Take 1 tablet by mouth daily.  . Cholecalciferol (VITAMIN D-3) 25 MCG (1000 UT) CAPS Take 2,000 Units by mouth daily.  Marland Kitchen CONTRAVE 8-90 MG TB12 Take 2 tablets by mouth 2 (two) times daily.  Marland Kitchen gabapentin (NEURONTIN) 300 MG capsule Take 300 mg by mouth at bedtime as needed (pain).   . Multiple Vitamins-Minerals (HAIR/SKIN/NAILS PO) Take 1 tablet by mouth daily.  . Omega-3 Fatty Acids (FISH OIL PO) Take 1 capsule by mouth daily.  Marland Kitchen topiramate (TOPAMAX) 25 MG tablet Take 1 tablet (25 mg total) by mouth at bedtime.  . vitamin B-12 (CYANOCOBALAMIN) 1000 MCG tablet Take 5,000 mcg by mouth daily.     Allergies:   Nsaids and Other   Past Medical History:  Diagnosis Date  . Acute pulmonary embolism ([place]) 06/26/2019  . Arthritis    Left knee  . Carpal tunnel syndrome of right wrist 02/10/2018  . Essential hypertension 10/26/2016  . Family history of early CAD   . Gallstone 06/25/2018  . Hypertension   . Incidental lung nodule 06/26/2019  . Menorrhagia 02/22/2015  . Morbid obesity ([place]) 11/10/2012  BMI 63   . Pain in right hand 01/27/2018  . PONV (postoperative nausea and vomiting)   . Right upper quadrant pain 06/25/2018  . Steatosis of liver 06/25/2018    Past Surgical History:  Procedure Laterality Date  . CARPAL TUNNEL RELEASE Right 04/04/2018   Procedure: CARPAL TUNNEL RELEASE;  Surgeon: Latanya Maudlin, MD;  Location: [facility];  Service: Orthopedics;  Laterality: Right;  5min  . CHOLECYSTECTOMY    . gastic sleeve  2015  . HYSTEROSCOPY WITH NOVASURE N/A 03/29/2015   Procedure: HYSTEROSCOPY WITH NOVASURE;  Surgeon: Anastasio Auerbach, MD;  Location: [facility];  Service: Gynecology;  Laterality: N/A;  to follow first case around 10:15am.  Requests one hour OR time.  . INTRAUTERINE DEVICE INSERTION     X 2  Spontaneously extruded X 2.   2014/2015  . TUBAL LIGATION  approximately 2000   as an interval procedure historically      Social History:  The patient  reports that she has quit smoking. She has never used smokeless tobacco. She reports current alcohol use. She reports that she does not use drugs.   Family History:  The patient's family history includes Cancer in an other family member; Heart attack in her father and another family member; Hyperlipidemia in her father; Hypertension in her mother and another family member; Parkinson's disease in her mother; Stroke in an other family member.    ROS:  Please see the history of present illness. All other systems are reviewed and  Negative to the above problem except as noted.    PHYSICAL EXAM: VS:  BP 136/78   Pulse (!) 107   Ht 5\' 2"  (1.575 m)   Wt (!) 349 lb (158.3 kg)   SpO2 98%   BMI 63.83 kg/m   GEN: Morbidly obese 49 yo  in no acute distress  HEENT: normal  Neck: no JVD, carotid bruits  Cardiac: RRR; no murmurs, rubs, or gallops, 1+ LE edema  Respiratory:  Rel clear to auscul  No wheezes   No rales GI: soft, nontender,distended   MS: no deformity Moving all extremities   Skin: warm and dry, no rash Neuro:  Strength and sensation are intact Psych: euthymic mood, full affect   EKG:  EKG is not ordered today.  On 06/25/19:  ST 102 bpm     CT scan 06/25/19 IMPRESSION: Peripheral pulmonary embolism, nonocclusive.  No right heart strain.  A 6 mm right middle lobe subsolid fissural based nodule of unknown stability. Initial follow-up with CT at 6-12 months is recommended to confirm persistence. If persistent, repeat CT is recommended every 2 years until 5 years of stability has been established. This recommendation follows the consensus statement: Guidelines for Management of Incidental Pulmonary Nodules Detected on CT Images: From the Fleischner Society 2017; Radiology 2017; [phone].  Mild three-vessel coronary calcification.  Lipid Panel    Component Value Date/Time   CHOL 150 11/10/2012 0955   TRIG 69 11/10/2012 0955   HDL 32  (L) 11/10/2012 0955   CHOLHDL 4.7 11/10/2012 0955   VLDL 14 11/10/2012 0955   LDLCALC 104 (H) 11/10/2012 0955      Wt Readings from Last 3 Encounters:  06/29/19 (!) 349 lb (158.3 kg)  06/26/19 (!) 349 lb (158.3 kg)  06/23/19 (!) 352 lb 12.8 oz (160 kg)      ASSESSMENT AND PLAN:  1  Dyspnea.   Pt with Hx COVID infection x 2 and now documented PE  She is also  morbidly obese though she said before November she felt OK Sats on RA OK even with ambulation.  Will get echo to eval LV, RV function; estimate pulmonary pressures Will check BNP   Fluid status diffiuclt to assess  BNP may be normal with increased BNPase  2   CAD  Pt with mild calcifications.    I am not convinced chest pressure represents angina.    May be related to Problem 1     Will follow with echo first     Risk factor modificatoin  3  Lipids   Check lipids    4  HTN   BP is fair   Follow    Current medicines are reviewed at length with the patient today.  The patient does not have concerns regarding medicines.  Signed, Dorris Carnes, MD  06/29/2019 3:39 PM    [address], [place], [place]  [address] Phone: [phone]; Fax: [phone]

## 2019-06-30 ENCOUNTER — Telehealth: Payer: Self-pay | Admitting: Internal Medicine

## 2019-06-30 ENCOUNTER — Telehealth: Payer: Self-pay

## 2019-06-30 LAB — PRO B NATRIURETIC PEPTIDE: NT-Pro BNP: 14 pg/mL (ref 0–249)

## 2019-06-30 LAB — BASIC METABOLIC PANEL
BUN/Creatinine Ratio: 16 (ref 9–23)
BUN: 12 mg/dL (ref 6–24)
CO2: 21 mmol/L (ref 20–29)
Calcium: 9.7 mg/dL (ref 8.7–10.2)
Chloride: 103 mmol/L (ref 96–106)
Creatinine, Ser: 0.77 mg/dL (ref 0.57–1.00)
GFR calc Af Amer: 104 mL/min/{1.73_m2} (ref >59)
GFR calc non Af Amer: 90 mL/min/{1.73_m2} (ref >59)
Glucose: 90 mg/dL (ref 65–99)
Potassium: 4.3 mmol/L (ref 3.5–5.2)
Sodium: 140 mmol/L (ref 134–144)

## 2019-06-30 NOTE — Telephone Encounter (Signed)
Jaw pain is not typically associated with Eliquis use. She could be having an allergic response if she is noticing throat tightening. Obesity and deconditioning could also be contributing to this. Could try changing pt to Xarelto treatment dose to see if she tolerates it better - VTE treatment dose is 15mg  twice daily for 21 days, then 20mg  once daily.

## 2019-06-30 NOTE — Telephone Encounter (Signed)
Reviewing with PharmD.

## 2019-06-30 NOTE — Telephone Encounter (Signed)
Spoke with patient who states that her throat feels like it is tightening and she gets a pain along her jaw line for about 30 min to 1 hour after taking Eliquis. States that she takes tylenol and it goes away. This has occurred every time she has taken Eliquis since Saturday.   I agree with Fuller Canada that this is very odd reaction, but timing does suggest that its from Elqiuis. I agree that trying treatment does of Xarelto would be a good option.

## 2019-06-30 NOTE — Telephone Encounter (Signed)
Pt c/o medication issue:  1. Name of Medication: Apixaban Starter Pack (ELIQUIS STARTER PACK) 5 MG TBPK  2. How are you currently taking this medication (dosage and times per day)? Twice a day  3. Are you having a reaction (difficulty breathing--STAT)? no  4. What is your medication issue? Opal Sidles Therapist, sports, from [facility] [employer] calling stating she spoke with the patient today and the patient told her her throat tightens up and she gets a pain in her jaw line when she takes her eliquis. She states the patient told Dr. Harrington Challenger and they said they would check the pharmacy for an allergy. Opal Sidles would like a nurse to call the patient back before she takes her second dose tonight.

## 2019-06-30 NOTE — Telephone Encounter (Signed)
Transition Care Management Follow-up Telephone Call  Date of discharge and from where: 06/26/2019, [facility]   How have you been since you were released from the hospital? She said she is not sure that the eliquis is working. She said that when she takes it her throat tightens up and she gets pain in her jaw line.  She said that she explained this to the cardiologist yesterday and they told her they would check with pharmacy about an allergic reaction.  She said that they did not tell her to stop taking it and she took it last night and this morning and experienced the same symptoms as noted.  Instructed her to call cardiology back and inquire if they have any more information/instructions from the pharmacy and explain that she has taken it twice since her appointment and continues to have the same symptoms.  Informed her that Dr Juleen China would also be notified of her concern of this concern  This CM called [facility] # [phone]. Spoke to [place] and reported the above noted concern about eliquis.  Requested that the patient be contacted today to address her concern.  Selena said she would route the message to Dr Harrington Challenger' nurse for follow up.   Any questions or concerns? noted above  Items Reviewed:  Did the pt receive and understand the discharge instructions provided?  yes, no questions at this time  Medications obtained and verified?  she said that she has medications. They were reviewed with cardiology yesterday.  She did state that she doesn't take any of the vitamins on the list.  Her concern about eliquis is noted above.   Any new allergies since your discharge?  she has a concern about eliquis.   Do you have support at home?  lives with her husband  Other (ie: DME, [place], etc) no home health or DME ordered. She has a cane to use if needed  Functional Questionnaire: (I = Independent and D = Dependent) ADL's: independent  Follow up appointments reviewed:    PCP  Hospital f/u appt confirmed?.scheduled televisit with Dr Juleen China 07/08/2019 @ [facility] f/u appt confirmed? saw cardiology yesterday.   Are transportation arrangements needed? no, she has transportation.   If their condition worsens, is the pt aware to call  their PCP or go to the ED?  yes  Was the patient provided with contact information for the PCP's office or ED?  she has the phone number for the clinic  Was the pt encouraged to call back with questions or concerns?   yes

## 2019-06-30 NOTE — Telephone Encounter (Signed)
Called patient back and asked her to call PCP to discuss changing to Xarelto due to symptoms she is having after each dose of Eliquis.  I adv per the hospital dc summary, it looks like she was instructed to f/u with PCP re: PE. Pt verbalizes understanding and is in agreement with this plan.

## 2019-07-02 LAB — COLOGUARD

## 2019-07-08 ENCOUNTER — Encounter: Payer: Self-pay | Admitting: Internal Medicine

## 2019-07-08 ENCOUNTER — Ambulatory Visit (INDEPENDENT_AMBULATORY_CARE_PROVIDER_SITE_OTHER): Payer: 59 | Admitting: Internal Medicine

## 2019-07-08 ENCOUNTER — Other Ambulatory Visit: Payer: Self-pay

## 2019-07-08 DIAGNOSIS — I2699 Other pulmonary embolism without acute cor pulmonale: Secondary | ICD-10-CM

## 2019-07-08 DIAGNOSIS — Z8616 Personal history of COVID-19: Secondary | ICD-10-CM

## 2019-07-08 DIAGNOSIS — R911 Solitary pulmonary nodule: Secondary | ICD-10-CM | POA: Diagnosis not present

## 2019-07-08 DIAGNOSIS — Z7901 Long term (current) use of anticoagulants: Secondary | ICD-10-CM

## 2019-07-08 DIAGNOSIS — Z09 Encounter for follow-up examination after completed treatment for conditions other than malignant neoplasm: Secondary | ICD-10-CM

## 2019-07-08 DIAGNOSIS — I10 Essential (primary) hypertension: Secondary | ICD-10-CM

## 2019-07-08 MED ORDER — APIXABAN 5 MG PO TABS: 5.0000 mg | ORAL_TABLET | Freq: Two times a day (BID) | ORAL | 3 refills | Status: DC

## 2019-07-08 NOTE — Progress Notes (Signed)
Virtual Visit via Telephone Note  I connected with Karen [name], on 07/08/2019 at 10:51 AM by telephone due to the COVID-19 pandemic and verified that I am speaking with the correct person using two identifiers.   Consent: I discussed the limitations, risks, security and privacy concerns of performing an evaluation and management service by telephone and the availability of in person appointments. I also discussed with the patient that there may be a patient responsible charge related to this service. The patient expressed understanding and agreed to proceed.   Location of Patient: Home   Location of Provider: Clinic    Persons participating in Telemedicine visit: Karen [name] Dr. Juleen China      History of Present Illness: Patient has visit for hospital f/u. Patient was hospitalized for PE likely provoked from recent COVID-19 infection. Patient was discharged on Eliquis. She is now taking the single dose. She reports the side effects improved once she dropped to 5 mg BID from 10 mg BID. She reports she saw blood in her stool one time but otherwise no melena or hematochezia. Some mild bleeding at gum lines occasionally with brushing teeth. No bruising. Still having some SOB with talking and exertion. Believes she might have a pulse ox machine at home.   Principal Problem:   Acute pulmonary embolism (HCC) Active Problems:   Essential hypertension   Incidental lung nodule   1. Nonocclusive PE without cor pulmonale likely provoked from recent COVID-19 infection 1. Hemodynamically stable on room air 2. Bilateral lower extremity Doppler negative for DVT 3. Tolerating Lovenox while inpatient 4. Discharged on Eliquis for 3 to 6 months dosing and outpatient follow-up 2. Hypertension on Norvasc 3. Incidental subsolid lung nodule -repeat CT in 6 to 12 months   Past Medical History:  Diagnosis Date  . Acute pulmonary embolism ([place]) 06/26/2019  . Arthritis     Left knee  . Carpal tunnel syndrome of right wrist 02/10/2018  . Essential hypertension 10/26/2016  . Family history of early CAD   . Gallstone 06/25/2018  . Hypertension   . Incidental lung nodule 06/26/2019  . Menorrhagia 02/22/2015  . Morbid obesity ([place]) 11/10/2012   BMI 63   . Pain in right hand 01/27/2018  . PONV (postoperative nausea and vomiting)   . Right upper quadrant pain 06/25/2018  . Steatosis of liver 06/25/2018   Allergies  Allergen Reactions  . Nsaids     History of gastric sleeve  . Other Other (See Comments)    Patient stated that she can only take chewable and liquid medication Patient says she can now swallow solid pills/capsules/caplets 06/25/2018    Current Outpatient Medications on File Prior to Visit  Medication Sig Dispense Refill  . acetaminophen (TYLENOL) 500 MG tablet Take 500 mg by mouth every 6 (six) hours as needed for moderate pain.    Marland Kitchen albuterol (VENTOLIN HFA) 108 (90 Base) MCG/ACT inhaler Inhale 2 puffs into the lungs every 6 (six) hours as needed for wheezing or shortness of breath. 18 g 0  . amLODipine (NORVASC) 10 MG tablet Take 1 tablet (10 mg total) by mouth daily. 90 tablet 0  . Apixaban Starter Pack (ELIQUIS STARTER PACK) 5 MG TBPK Take as directed on package: start with two-5mg  tablets twice daily for 7 days. On day 8, switch to one-5mg  tablet twice daily. 1 each 0  . b complex vitamins tablet Take 1 tablet by mouth daily.    . butalbital-acetaminophen-caffeine (FIORICET) 50-325-40 MG tablet Take 1-2  tablets by mouth every 8 (eight) hours as needed for headache. 30 tablet 1  . Calcium Carbonate-Vitamin D (CALCIUM-VITAMIN D) 600-125 MG-UNIT TABS Take 1 tablet by mouth daily.    . Cholecalciferol (VITAMIN D-3) 25 MCG (1000 UT) CAPS Take 2,000 Units by mouth daily.    Marland Kitchen CONTRAVE 8-90 MG TB12 Take 2 tablets by mouth 2 (two) times daily.    Marland Kitchen gabapentin (NEURONTIN) 300 MG capsule Take 300 mg by mouth at bedtime as needed (pain).     . Multiple  Vitamins-Minerals (HAIR/SKIN/NAILS PO) Take 1 tablet by mouth daily.    . Omega-3 Fatty Acids (FISH OIL PO) Take 1 capsule by mouth daily.    Marland Kitchen topiramate (TOPAMAX) 25 MG tablet Take 1 tablet (25 mg total) by mouth at bedtime. 30 tablet 1  . vitamin B-12 (CYANOCOBALAMIN) 1000 MCG tablet Take 5,000 mcg by mouth daily.     No current facility-administered medications on file prior to visit.    Observations/Objective: NAD. Speaking clearly.  Work of breathing normal.  Alert and oriented. Mood appropriate.   Assessment and Plan: 1. Hospital discharge follow-up  2. Acute pulmonary embolism, unspecified pulmonary embolism type, unspecified whether acute cor pulmonale present (HCC) Continue anticoagulation. Likely provoked related to COVID and would continue anticoagulation for 6 months. Counseled that if she sees recurrent bleeding in stools will need to be evaluated. Counseled that would expect gradual return to normal activity over the next 6 weeks. Would recommend she check pulse ox if able. If experiences severe SOB or chest pain, needs to seek care in ER. Will f/u in maximum of 6 months to discuss discontinuing anticoagulation and also for repeat lung imaging as noted below. Consider COVID f/u with Dr. Joya Gaskins in interim.  - apixaban (ELIQUIS) 5 MG TABS tablet; Take 1 tablet (5 mg total) by mouth 2 (two) times daily.  Dispense: 60 tablet; Refill: 3  3. Incidental lung nodule From CT March 2021: A 6 mm right middle lobe subsolid fissural based nodule of unknown stability. Initial follow-up with CT at 6-12 months is recommended to confirm persistence. If persistent, repeat CT is recommended every 2 years until 5 years of stability has been established.  Follow Up Instructions: 6 month f/u for PE and incidental lung nodule; PRN if needed    I discussed the assessment and treatment plan with the patient. The patient was provided an opportunity to ask questions and all were answered. The patient  agreed with the plan and demonstrated an understanding of the instructions.   The patient was advised to call back or seek an in-person evaluation if the symptoms worsen or if the condition fails to improve as anticipated.     I provided 28 minutes total of non-face-to-face time during this encounter including median intraservice time, reviewing previous notes, investigations, ordering medications, medical decision making, coordinating care and patient verbalized understanding at the end of the visit.    Phill Myron, D.O. Primary Care at [name]  07/08/2019, 10:51 AM

## 2019-07-09 ENCOUNTER — Ambulatory Visit (HOSPITAL_COMMUNITY): Payer: 59 | Attending: Internal Medicine

## 2019-07-09 DIAGNOSIS — I2699 Other pulmonary embolism without acute cor pulmonale: Secondary | ICD-10-CM | POA: Diagnosis present

## 2019-07-09 DIAGNOSIS — R0602 Shortness of breath: Secondary | ICD-10-CM | POA: Diagnosis present

## 2019-07-15 ENCOUNTER — Telehealth: Payer: Self-pay

## 2019-07-15 ENCOUNTER — Telehealth: Payer: Self-pay | Admitting: Internal Medicine

## 2019-07-15 MED ORDER — FUROSEMIDE 40 MG PO TABS: 40.0000 mg | ORAL_TABLET | ORAL | 3 refills | Status: DC

## 2019-07-15 MED ORDER — POTASSIUM CHLORIDE ER 10 MEQ PO TBCR: 10.0000 meq | EXTENDED_RELEASE_TABLET | ORAL | 3 refills | Status: DC

## 2019-07-15 NOTE — Telephone Encounter (Signed)
New message ° ° ° ° °Returning a call to the nurse °

## 2019-07-15 NOTE — Telephone Encounter (Signed)
The patient has been notified of the Echo result and verbalized understanding.  All questions (if any) were answered. Frederik Schmidt, RN 07/15/2019 9:36 AM

## 2019-07-15 NOTE — Telephone Encounter (Signed)
The patient has been notified of the result and verbalized understanding.  All questions (if any) were answered. Frederik Schmidt, RN 07/15/2019 10:04 AM

## 2019-07-15 NOTE — Telephone Encounter (Signed)
lpmtcb 4/7

## 2019-07-15 NOTE — Telephone Encounter (Signed)
-----   Message from Fay Records, MD sent at 07/14/2019 10:57 PM EDT ----- Echo shows pumping function of the Right Ventricle and Left ventricle are normal Normal valve function   Labs a couple weeks ago showed kidney function and fluid appear to be OK Could try lasix 40 with 10 KCL 1 time per week to see if helps with breathing  Call back with response   Watch/limit salt

## 2019-07-19 ENCOUNTER — Other Ambulatory Visit: Payer: Self-pay | Admitting: Internal Medicine

## 2019-07-24 ENCOUNTER — Ambulatory Visit: Payer: 59 | Admitting: Internal Medicine

## 2019-07-27 LAB — COLOGUARD: COLOGUARD: NEGATIVE

## 2019-07-29 ENCOUNTER — Other Ambulatory Visit: Payer: Self-pay

## 2019-07-29 ENCOUNTER — Encounter: Payer: Self-pay | Admitting: Critical Care Medicine

## 2019-07-29 ENCOUNTER — Ambulatory Visit: Payer: 59 | Attending: Critical Care Medicine | Admitting: Critical Care Medicine

## 2019-07-29 VITALS — BP 140/84 | HR 117 | Temp 98.1°F | Resp 16 | Wt 356.4 lb

## 2019-07-29 DIAGNOSIS — R0600 Dyspnea, unspecified: Secondary | ICD-10-CM | POA: Insufficient documentation

## 2019-07-29 DIAGNOSIS — M79604 Pain in right leg: Secondary | ICD-10-CM

## 2019-07-29 DIAGNOSIS — I2699 Other pulmonary embolism without acute cor pulmonale: Secondary | ICD-10-CM

## 2019-07-29 DIAGNOSIS — Z8616 Personal history of COVID-19: Secondary | ICD-10-CM | POA: Diagnosis not present

## 2019-07-29 DIAGNOSIS — F32A Depression, unspecified: Secondary | ICD-10-CM | POA: Insufficient documentation

## 2019-07-29 DIAGNOSIS — R911 Solitary pulmonary nodule: Secondary | ICD-10-CM

## 2019-07-29 DIAGNOSIS — Z7901 Long term (current) use of anticoagulants: Secondary | ICD-10-CM

## 2019-07-29 DIAGNOSIS — F329 Major depressive disorder, single episode, unspecified: Secondary | ICD-10-CM

## 2019-07-29 DIAGNOSIS — R4189 Other symptoms and signs involving cognitive functions and awareness: Secondary | ICD-10-CM

## 2019-07-29 DIAGNOSIS — F488 Other specified nonpsychotic mental disorders: Secondary | ICD-10-CM

## 2019-07-29 MED ORDER — PANTOPRAZOLE SODIUM 40 MG PO TBEC: 40.0000 mg | DELAYED_RELEASE_TABLET | Freq: Every day | ORAL | 3 refills | Status: DC

## 2019-07-29 NOTE — Assessment & Plan Note (Signed)
The patient scores very high on her depression screen and we will have our licensed clinical social worker connect with this patient

## 2019-07-29 NOTE — Assessment & Plan Note (Signed)
Significant morbid obesity and for this will refer to bariatric medical clinic

## 2019-07-29 NOTE — Progress Notes (Signed)
Subjective:    Patient ID: Karen [name], female    DOB: Sep 07, 1970, 49 y.o.   MRN: [id number]  This is a 49 year old female morbidly obese referred by primary care for follow-up of pulmonary embolism status post Covid infection.  Note the patient was admitted in March between the 18th and 19th for pulmonary embolism right lower lobe the patient had 2 episodes of Covid infection 1 in December and one in February.  The patient states with the first episode she had dyspnea headaches nausea loss of taste and smell abdominal complaints and severe fatigue with the second episode similar symptoms but no loss of taste or smell she also noted right leg edema December through January and still is having pain behind the right knee note when she was admitted to the hospital she had a vascular ultrasound of both lower extremities negative for DVT but the pulmonary embolism was seen in the right lower lobe and was nonobstructive.  The patient was maintained now on Eliquis 5 mg twice daily after taking 21 days of Eliquis 10 mg twice daily.  Patient currently has no bleeding she has no chest pain she does have significant fatigue still still short of breath and somewhat foggy in the brain.  She denies headaches but does complain that she is unable to focus and has poor memory.  She denies any falls.  Below is DC summary from march 2021  1. Hospital discharge follow-up  2. Acute pulmonary embolism, unspecified pulmonary embolism type, unspecified whether acute cor pulmonale present (HCC) Continue anticoagulation. Likely provoked related to COVID and would continue anticoagulation for 6 months. Counseled that if she sees recurrent bleeding in stools will need to be evaluated. Counseled that would expect gradual return to normal activity over the next 6 weeks. Would recommend she check pulse ox if able. If experiences severe SOB or chest pain, needs to seek care in ER. Will f/u in maximum of 6 months to discuss  discontinuing anticoagulation and also for repeat lung imaging as noted below. Consider COVID f/u with Dr. Joya Gaskins in interim.  - apixaban (ELIQUIS) 5 MG TABS tablet; Take 1 tablet (5 mg total) by mouth 2 (two) times daily.  Dispense: 60 tablet; Refill: 3  3. Incidental lung nodule From CT March 2021: A 6 mm right middle lobe subsolid fissural based nodule of unknown stability. Initial follow-up with CT at 6-12 months is recommended to confirm persistence. If persistent, repeat CT is recommended every 2 years until 5 years of stability has been established.  Admit date: 06/25/2019 Discharge date: 06/26/2019 Length of Stay: 0 days   Code Status: Full Code  Admitted From:  Home Discharged to:   [place]:  None  Equipment/Devices:  None Discharge Condition:  Stable  Recommendations for Outpatient Follow-up  1. Follow up with PCP in 1 week 2. Started on Eliquis for 3 to 6 months for provoked PE in setting of recent COVID-19 infection.  Please follow-up outpatient  Hospital Summary Karen [name] is a 49 y.o. female with a history of hypertension, morbid obesity, recent COVID-27 infection in November 2020 persistent shortness of breath who was admitted for acute on chronic shortness of breath with CTA chest positive outpatient for pulmonary embolus.  ED Course:Upon arrival to the ED, patient is found to be afebrile, saturating mid 90s on room air, mildly tachypneic, tachycardic in the low 120s, and with stable blood pressure. EKG features sinus tachycardia. Chemistry panel is unremarkable and CBC with mild leukocytosis.  Patient was started on Lovenox 1 mg/kg in the ED and hospitalist consulted for admission.   Bilateral lower extremity Doppler negative for DVT.  Discharged on Eliquis for submassive PE treatment for 3 to 6 months duration.  A & P  Principal Problem:   Acute pulmonary embolism (HCC) Active Problems:   Essential hypertension   Incidental lung  nodule   1. Nonocclusive PE without cor pulmonale likely provoked from recent COVID-19 infection 1. Hemodynamically stable on room air 2. Bilateral lower extremity Doppler negative for DVT 3. Tolerating Lovenox while inpatient 4. Discharged on Eliquis for 3 to 6 months dosing and outpatient follow-up 2. Hypertension on Norvasc 3. Incidental subsolid lung nodule -repeat CT in 6 to 12 months   The patient states she has a change in her voice has increased heartburn.  She was followed by bariatric medicine when she was living in [place] and had gastritis in the stomach did have a sleeve placed but was not been able to continue her weight loss.  She remains lightheaded and dizzy as well.  She awakens at night gasping for air.  She did have a negative sleep study but I cannot find the results of that.   Past Medical History:  Diagnosis Date  . Acute pulmonary embolism ([place]) 06/26/2019  . Arthritis    Left knee  . Carpal tunnel syndrome of right wrist 02/10/2018  . Essential hypertension 10/26/2016  . Family history of early CAD   . Gallstone 06/25/2018  . Hypertension   . Incidental lung nodule 06/26/2019  . Menorrhagia 02/22/2015  . Morbid obesity ([place]) 11/10/2012   BMI 63   . Pain in right hand 01/27/2018  . PONV (postoperative nausea and vomiting)   . Right upper quadrant pain 06/25/2018  . Steatosis of liver 06/25/2018     Family History  Problem Relation Age of Onset  . Hyperlipidemia Father   . Heart attack Father   . Heart attack Other   . Hypertension Other   . Cancer Other   . Stroke Other   . Hypertension Mother   . Parkinson's disease Mother      Social History   Socioeconomic History  . Marital status: Married    Spouse name: Not on file  . Number of children: Not on file  . Years of education: Not on file  . Highest education level: Not on file  Occupational History  . Not on file  Tobacco Use  . Smoking status: Former Research scientist (life sciences)  . Smokeless tobacco: Never  Used  Substance and Sexual Activity  . Alcohol use: Yes    Comment: occas   . Drug use: No  . Sexual activity: Yes    Birth control/protection: Surgical    Comment: BTL  Other Topics Concern  . Not on file  Social History Narrative  . Not on file   Social Determinants of Health   Financial Resource Strain:   . Difficulty of Paying Living Expenses:   Food Insecurity:   . Worried About Charity fundraiser in the Last Year:   . Arboriculturist in the Last Year:   Transportation Needs:   . Film/video editor (Medical):   Marland Kitchen Lack of Transportation (Non-Medical):   Physical Activity:   . Days of Exercise per Week:   . Minutes of Exercise per Session:   Stress:   . Feeling of Stress :   Social Connections:   . Frequency of Communication with Friends and Family:   .  Frequency of Social Gatherings with Friends and Family:   . Attends Religious Services:   . Active Member of Clubs or Organizations:   . Attends Archivist Meetings:   Marland Kitchen Marital Status:   Intimate Partner Violence:   . Fear of Current or Ex-Partner:   . Emotionally Abused:   Marland Kitchen Physically Abused:   . Sexually Abused:      Allergies  Allergen Reactions  . Nsaids     History of gastric sleeve  . Other Other (See Comments)    Patient stated that she can only take chewable and liquid medication Patient says she can now swallow solid pills/capsules/caplets 06/25/2018     Outpatient Medications Prior to Visit  Medication Sig Dispense Refill  . acetaminophen (TYLENOL) 500 MG tablet Take 500 mg by mouth every 6 (six) hours as needed for moderate pain.    Marland Kitchen albuterol (VENTOLIN HFA) 108 (90 Base) MCG/ACT inhaler TAKE 2 PUFFS BY MOUTH EVERY 6 HOURS AS NEEDED FOR WHEEZE OR SHORTNESS OF BREATH 18 g 1  . amLODipine (NORVASC) 10 MG tablet Take 1 tablet (10 mg total) by mouth daily. 90 tablet 0  . apixaban (ELIQUIS) 5 MG TABS tablet Take 1 tablet (5 mg total) by mouth 2 (two) times daily. 60 tablet 3  . b  complex vitamins tablet Take 1 tablet by mouth daily.    . Calcium Carbonate-Vitamin D (CALCIUM-VITAMIN D) 600-125 MG-UNIT TABS Take 1 tablet by mouth daily.    . Cholecalciferol (VITAMIN D-3) 25 MCG (1000 UT) CAPS Take 2,000 Units by mouth daily.    . furosemide (LASIX) 40 MG tablet Take 1 tablet (40 mg total) by mouth every Monday. 30 tablet 3  . gabapentin (NEURONTIN) 300 MG capsule Take 300 mg by mouth at bedtime as needed (pain).     . Multiple Vitamins-Minerals (HAIR/SKIN/NAILS PO) Take 1 tablet by mouth daily.    . Omega-3 Fatty Acids (FISH OIL PO) Take 1 capsule by mouth daily.    . potassium chloride (KLOR-CON) 10 MEQ tablet Take 1 tablet (10 mEq total) by mouth every Monday. 30 tablet 3  . vitamin B-12 (CYANOCOBALAMIN) 1000 MCG tablet Take 5,000 mcg by mouth daily.    . butalbital-acetaminophen-caffeine (FIORICET) 50-325-40 MG tablet Take 1-2 tablets by mouth every 8 (eight) hours as needed for headache. (Patient not taking: Reported on 07/29/2019) 30 tablet 1  . CONTRAVE 8-90 MG TB12 Take 2 tablets by mouth 2 (two) times daily.    Marland Kitchen topiramate (TOPAMAX) 25 MG tablet Take 1 tablet (25 mg total) by mouth at bedtime. (Patient not taking: Reported on 07/29/2019) 30 tablet 1   No facility-administered medications prior to visit.      Review of Systems  Constitutional: Positive for activity change, appetite change, fatigue and unexpected weight change. Negative for fever.  HENT: Negative.   Eyes: Negative.   Respiratory: Positive for chest tightness and shortness of breath. Negative for apnea, cough, choking, wheezing and stridor.   Cardiovascular: Positive for leg swelling. Negative for chest pain and palpitations.  Gastrointestinal:       Gerd  Genitourinary: Negative.   Musculoskeletal: Positive for back pain.  Skin: Negative.   Neurological: Positive for dizziness, weakness, light-headedness and headaches. Negative for tremors and syncope.  Hematological: Negative for adenopathy.  Bruises/bleeds easily.  Psychiatric/Behavioral: Positive for confusion, decreased concentration, dysphoric mood and sleep disturbance. Negative for self-injury and suicidal ideas. The patient is nervous/anxious.        Objective:   Physical  Exam Vitals:   07/29/19 1016  BP: 140/84  Pulse: (!) 117  Resp: 16  Temp: 98.1 F (36.7 C)  SpO2: 93%  Weight: (!) 356 lb 6.4 oz (161.7 kg)    Gen: Pleasant, morbidly obese, in no distress,  normal affect  ENT: No lesions,  mouth clear,  oropharynx clear, no postnasal drip  Neck: No JVD, no TMG, no carotid bruits  Lungs: No use of accessory muscles, no dullness to percussion, clear without rales or rhonchi  Cardiovascular: RRR, heart sounds normal, no murmur or gallops, no peripheral edema  Abdomen: soft and NT, no HSM,  BS normal  Musculoskeletal: No deformities, no cyanosis or clubbing  Neuro: alert, non focal  Skin: Warm, no lesions or rashes  All results reviewed in the [facility] system including CT angiogram and vascular studies and all lab work  Depression screen [facility] 2/9 07/29/2019 06/23/2019  Decreased Interest 3 2  Down, Depressed, Hopeless 3 2  PHQ - 2 Score 6 4  Altered sleeping 3 3  Tired, decreased energy 3 2  Change in appetite 3 3  Feeling bad or failure about yourself  1 0  Trouble concentrating 3 3  Moving slowly or fidgety/restless 3 0  Suicidal thoughts 0 0  PHQ-9 Score 22 15       Assessment & Plan:  I personally reviewed all images and lab data in the [facility] system as well as any outside material available during this office visit and agree with the  radiology impressions.   Acute pulmonary embolism (HCC) Pulmonary embolism right lower lobe likely related to Covid infection  For now plan on apixaban 5 mg twice daily for a minimum of 12 months  Plan also to get an overnight sleep oximetry to see if she is desaturating at night also plan to repeat vascular ultrasound right lower extremity  Depression The  patient scores very high on her depression screen and we will have our licensed clinical social worker connect with this patient  Incidental lung nodule Incidental lung nodule seen I suspect is of no significance would repeat CT scan in 12 months and if unchanged repeat every 2 years until 5 years of stability has been established  Morbid obesity Significant morbid obesity and for this will refer to bariatric medical clinic  Brain fog Brain fog likely post Covid in nature will refer to the Covid clinic for further evaluation and patient does have upcoming neurology appointment   Karen [name] was seen today for headache, knee pain and pulmonary embolism.  Diagnoses and all orders for this visit:  Acute pulmonary embolism without acute cor pulmonale, unspecified pulmonary embolism type (HCC) -     Comprehensive metabolic panel -     CBC with Differential/Platelet; Future -     VAS [place] LOWER EXTREMITY VENOUS (DVT); Future -     CBC with Differential/Platelet  Morbid obesity (HCC) -     Comprehensive metabolic panel -     Thyroid Panel With TSH -     Amb Ref to Medical Weight Management  Nocturnal dyspnea -     Thyroid Panel With TSH  History of 2019 novel coronavirus disease (COVID-19) -     C-reactive protein  Chronic anticoagulation -     Comprehensive metabolic panel -     CBC with Differential/Platelet; Future -     CBC with Differential/Platelet  Brain fog -     Comprehensive metabolic panel -     Thyroid Panel With TSH -  Pulse oximetry, overnight; Future -     CBC with Differential/Platelet; Future -     C-reactive protein -     CBC with Differential/Platelet  Right leg pain -     VAS [place] LOWER EXTREMITY VENOUS (DVT); Future  Reactive depression  Incidental lung nodule  Other orders -     pantoprazole (PROTONIX) 40 MG tablet; Take 1 tablet (40 mg total) by mouth daily.

## 2019-07-29 NOTE — Assessment & Plan Note (Signed)
Incidental lung nodule seen I suspect is of no significance would repeat CT scan in 12 months and if unchanged repeat every 2 years until 5 years of stability has been established

## 2019-07-29 NOTE — Assessment & Plan Note (Addendum)
Pulmonary embolism right lower lobe likely related to Covid infection  For now plan on apixaban 5 mg twice daily for a minimum of 12 months  Plan also to get an overnight sleep oximetry to see if she is desaturating at night also plan to repeat vascular ultrasound right lower extremity

## 2019-07-29 NOTE — Assessment & Plan Note (Signed)
Brain fog likely post Covid in nature will refer to the Covid clinic for further evaluation and patient does have upcoming neurology appointment

## 2019-07-29 NOTE — Patient Instructions (Signed)
Start protonix one daily for reflux, take 1/2 hour before breakfast  Stop Contrave  Stay on apixaban twice a day  Referral to Covid clinic and Bariatric clinic was made  Keep your neurology appointment  Labs to check your metabolic thyroid and blood counts today  Vascular ultrasound of your right leg will be scheduled  Overnight sleep oximetry will be obtained  Return Dr Joya Gaskins one month

## 2019-07-30 ENCOUNTER — Ambulatory Visit (INDEPENDENT_AMBULATORY_CARE_PROVIDER_SITE_OTHER): Payer: 59 | Admitting: Nurse Practitioner

## 2019-07-30 ENCOUNTER — Ambulatory Visit (HOSPITAL_COMMUNITY)
Admission: RE | Admit: 2019-07-30 | Discharge: 2019-07-30 | Disposition: A | Discharge status: Home / Self Care | Payer: 59 | Source: Ambulatory Visit | Attending: Critical Care Medicine | Admitting: Critical Care Medicine

## 2019-07-30 VITALS — BP 124/86 | HR 107 | Temp 97.5°F | Ht 62.0 in | Wt 357.0 lb

## 2019-07-30 DIAGNOSIS — R4189 Other symptoms and signs involving cognitive functions and awareness: Secondary | ICD-10-CM

## 2019-07-30 DIAGNOSIS — F488 Other specified nonpsychotic mental disorders: Secondary | ICD-10-CM | POA: Diagnosis not present

## 2019-07-30 DIAGNOSIS — I2699 Other pulmonary embolism without acute cor pulmonale: Secondary | ICD-10-CM | POA: Diagnosis present

## 2019-07-30 DIAGNOSIS — M79604 Pain in right leg: Secondary | ICD-10-CM | POA: Insufficient documentation

## 2019-07-30 DIAGNOSIS — Z8616 Personal history of COVID-19: Secondary | ICD-10-CM

## 2019-07-30 LAB — COMPREHENSIVE METABOLIC PANEL
ALT: 14 IU/L (ref 0–32)
AST: 18 IU/L (ref 0–40)
Albumin/Globulin Ratio: 1.5 (ref 1.2–2.2)
Albumin: 4.4 g/dL (ref 3.8–4.8)
Alkaline Phosphatase: 103 IU/L (ref 39–117)
BUN/Creatinine Ratio: 14 (ref 9–23)
BUN: 10 mg/dL (ref 6–24)
Bilirubin Total: 0.3 mg/dL (ref 0.0–1.2)
CO2: 20 mmol/L (ref 20–29)
Calcium: 9.7 mg/dL (ref 8.7–10.2)
Chloride: 102 mmol/L (ref 96–106)
Creatinine, Ser: 0.69 mg/dL (ref 0.57–1.00)
GFR calc Af Amer: 117 mL/min/{1.73_m2} (ref >59)
GFR calc non Af Amer: 102 mL/min/{1.73_m2} (ref >59)
Globulin, Total: 2.9 g/dL (ref 1.5–4.5)
Glucose: 108 mg/dL — ABNORMAL HIGH (ref 65–99)
Potassium: 4.4 mmol/L (ref 3.5–5.2)
Sodium: 138 mmol/L (ref 134–144)
Total Protein: 7.3 g/dL (ref 6.0–8.5)

## 2019-07-30 LAB — CBC WITH DIFFERENTIAL/PLATELET
Basophils Absolute: 0 10*3/uL (ref 0.0–0.2)
Basos: 0 %
EOS (ABSOLUTE): 0.1 10*3/uL (ref 0.0–0.4)
Eos: 1 %
Hematocrit: 40.8 % (ref 34.0–46.6)
Hemoglobin: 13.5 g/dL (ref 11.1–15.9)
Immature Grans (Abs): 0 10*3/uL (ref 0.0–0.1)
Immature Granulocytes: 0 %
Lymphocytes Absolute: 1.5 10*3/uL (ref 0.7–3.1)
Lymphs: 19 %
MCH: 27.7 pg (ref 26.6–33.0)
MCHC: 33.1 g/dL (ref 31.5–35.7)
MCV: 84 fL (ref 79–97)
Monocytes Absolute: 0.8 10*3/uL (ref 0.1–0.9)
Monocytes: 10 %
Neutrophils Absolute: 5.6 10*3/uL (ref 1.4–7.0)
Neutrophils: 70 %
Platelets: 313 10*3/uL (ref 150–450)
RBC: 4.87 x10E6/uL (ref 3.77–5.28)
RDW: 13.1 % (ref 11.7–15.4)
WBC: 8 10*3/uL (ref 3.4–10.8)

## 2019-07-30 LAB — C-REACTIVE PROTEIN: CRP: 26 mg/L — ABNORMAL HIGH (ref 0–10)

## 2019-07-30 LAB — THYROID PANEL WITH TSH
Free Thyroxine Index: 1.8 (ref 1.2–4.9)
T3 Uptake Ratio: 24 % (ref 24–39)
T4, Total: 7.4 ug/dL (ref 4.5–12.0)
TSH: 1.9 u[IU]/mL (ref 0.450–4.500)

## 2019-07-30 NOTE — Progress Notes (Signed)
@Patient  ID: Karen [name], female    DOB: 22-Oct-1970, 49 y.o.   MRN: [id number]  Chief Complaint  Patient presents with  . Post COVID    Patient stated she tested postive 02/2019 and 05/2019. Still have sx: SOB, major headaches, brain fog and fatigue. Also complaining of right leg pain which she has an [place] scheduled for today at [facility]    Referring provider: Caryl Never*   49 year old female with history of obesity, HTN. Diagnosed with Covid November 2020 and March 2021.   Recent Significant Encounters:   06/25/19 - 06/26/19 Hospital Admission: Admitted for PE. Doppler lower ext negative for DVT. Started on Eliquis. Incidental lung nodule - repeat CT in 6-12 months.   07/29/19 Pulmonary: Continue apixaban for 12 months, ordered overnight sleep oximetry, repeat vascular US, referred to counseling for depression, following lung nodule. Referred to bariatric clinic for obesity.   Tests:   Imaging:   06/25/19 CTA: Peripheral pulmonary embolism, nonocclusive.  No right heart strain. A 6 mm right middle lobe subsolid fissural based nodule of unknown stability. Initial follow-up with CT at 6-12 months is recommended to confirm persistence. If persistent, repeat CT is recommended every 2 years until 5 years of stability has been established. This recommendation follows the consensus statement: Guidelines for Management of Incidental Pulmonary Nodules Detected on CT Images: From the Fleischner Society 2017; Radiology 2017; 284:228-243. Mild three-vessel coronary calcification.   HPI  Patient presents today for post Covid care clinic visit.  Patient was recently hospitalized with PE after diagnosis of Covid.  Patient reports having 2 different episodes of Covid.  Her first illness was in November 2020 and her second illness was in February 2021.  Patient has followed up with PCP and pulmonary.  She is compliant with Eliquis.  Patient's presents for post Covid visit for complaint today of  ongoing memory loss and brain fog.  She states that she does have ongoing headaches as well.  Patient reports being fatigued.  She does have an upcoming appointment scheduled with neurology on May 11.  She states that she often forgets what she is going to stay in the middle of the sentence.  She states that she cannot remember her password at work.  She often starts to perform a task and forgets what she is doing.  Patient does say that she forgets to take her medications including her blood thinner at times.  She denies any issues with speech.  She did have complete lab work with pulmonary yesterday.  She reported to pulmonary yesterday that she is still having right leg pain and will have a follow-up ultrasound of lower extremity done today.  Patient's CRP was elevated with labs yesterday. Denies f/c/s, n/v/d, hemoptysis, PND, chest pain or edema.        Allergies  Allergen Reactions  . Nsaids     History of gastric sleeve  . Other Other (See Comments)    Patient stated that she can only take chewable and liquid medication Patient says she can now swallow solid pills/capsules/caplets 06/25/2018    Immunization History  Administered Date(s) Administered  . Tdap 04/09/2014    Past Medical History:  Diagnosis Date  . Acute pulmonary embolism ([place]) 06/26/2019  . Arthritis    Left knee  . Carpal tunnel syndrome of right wrist 02/10/2018  . Essential hypertension 10/26/2016  . Family history of early CAD   . Gallstone 06/25/2018  . Hypertension   . Incidental lung nodule  06/26/2019  . Menorrhagia 02/22/2015  . Morbid obesity ([place]) 11/10/2012   BMI 63   . Pain in right hand 01/27/2018  . PONV (postoperative nausea and vomiting)   . Right upper quadrant pain 06/25/2018  . Steatosis of liver 06/25/2018    Tobacco History: Social History   Tobacco Use  Smoking Status Former Smoker  Smokeless Tobacco Never Used   Counseling given: Yes   Outpatient Encounter Medications as of  07/30/2019  Medication Sig  . acetaminophen (TYLENOL) 500 MG tablet Take 500 mg by mouth every 6 (six) hours as needed for moderate pain.  Marland Kitchen albuterol (VENTOLIN HFA) 108 (90 Base) MCG/ACT inhaler TAKE 2 PUFFS BY MOUTH EVERY 6 HOURS AS NEEDED FOR WHEEZE OR SHORTNESS OF BREATH  . amLODipine (NORVASC) 10 MG tablet Take 1 tablet (10 mg total) by mouth daily.  Marland Kitchen apixaban (ELIQUIS) 5 MG TABS tablet Take 1 tablet (5 mg total) by mouth 2 (two) times daily.  Marland Kitchen b complex vitamins tablet Take 1 tablet by mouth daily.  . Calcium Carbonate-Vitamin D (CALCIUM-VITAMIN D) 600-125 MG-UNIT TABS Take 1 tablet by mouth daily.  . Cholecalciferol (VITAMIN D-3) 25 MCG (1000 UT) CAPS Take 2,000 Units by mouth daily.  . furosemide (LASIX) 40 MG tablet Take 1 tablet (40 mg total) by mouth every Monday.  . gabapentin (NEURONTIN) 300 MG capsule Take 300 mg by mouth at bedtime as needed (pain).   . Multiple Vitamins-Minerals (HAIR/SKIN/NAILS PO) Take 1 tablet by mouth daily.  . Omega-3 Fatty Acids (FISH OIL PO) Take 1 capsule by mouth daily.  . pantoprazole (PROTONIX) 40 MG tablet Take 1 tablet (40 mg total) by mouth daily.  . potassium chloride (KLOR-CON) 10 MEQ tablet Take 1 tablet (10 mEq total) by mouth every Monday.  . vitamin B-12 (CYANOCOBALAMIN) 1000 MCG tablet Take 5,000 mcg by mouth daily.   No facility-administered encounter medications on file as of 07/30/2019.     Review of Systems  Review of Systems  Constitutional: Positive for fatigue. Negative for activity change, chills and fever.  HENT: Negative.   Respiratory: Positive for cough and shortness of breath.   Cardiovascular: Negative for chest pain.  Gastrointestinal: Negative.   Allergic/Immunologic: Negative.   Neurological: Positive for headaches.  Psychiatric/Behavioral: Positive for decreased concentration.       Memory loss       Physical Exam  BP 124/86 (BP Location: Right Arm, Patient Position: Sitting, Cuff Size: Large)   Pulse (!)  107   Temp (!) 97.5 F (36.4 C)   Ht 5\' 2"  (1.575 m)   Wt (!) 357 lb (161.9 kg)   SpO2 97%   BMI 65.30 kg/m   Wt Readings from Last 5 Encounters:  07/30/19 (!) 357 lb (161.9 kg)  07/29/19 (!) 356 lb 6.4 oz (161.7 kg)  06/29/19 (!) 349 lb (158.3 kg)  06/26/19 (!) 349 lb (158.3 kg)  06/23/19 (!) 352 lb 12.8 oz (160 kg)     Physical Exam Vitals and nursing note reviewed.  Constitutional:      General: She is not in acute distress.    Appearance: She is well-developed.  Cardiovascular:     Rate and Rhythm: Normal rate and regular rhythm.  Pulmonary:     Effort: Pulmonary effort is normal.     Breath sounds: Normal breath sounds.  Neurological:     Mental Status: She is alert and oriented to person, place, and time.  Psychiatric:        Mood and  Affect: Mood normal.        Behavior: Behavior normal.     Comments: Slow to answer questions during exam today.       Lab Results:  CBC    Component Value Date/Time   WBC 8.0 07/29/2019 1111   WBC 9.1 06/26/2019 0421   RBC 4.87 07/29/2019 1111   RBC 4.50 06/26/2019 0421   HGB 13.5 07/29/2019 1111   HCT 40.8 07/29/2019 1111   PLT 313 07/29/2019 1111   MCV 84 07/29/2019 1111   MCV 81 05/29/2013 0420   MCH 27.7 07/29/2019 1111   MCH 27.6 06/26/2019 0421   MCHC 33.1 07/29/2019 1111   MCHC 31.1 06/26/2019 0421   RDW 13.1 07/29/2019 1111   RDW 16.6 (H) 05/29/2013 0420   LYMPHSABS 1.5 07/29/2019 1111   LYMPHSABS 1.2 05/29/2013 0420   MONOABS 0.9 06/25/2019 2209   MONOABS 0.9 05/29/2013 0420   EOSABS 0.1 07/29/2019 1111   EOSABS 0.0 05/29/2013 0420   BASOSABS 0.0 07/29/2019 1111   BASOSABS 0.0 05/29/2013 0420    BMET    Component Value Date/Time   NA 138 07/29/2019 1111   NA 137 05/29/2013 0420   K 4.4 07/29/2019 1111   K 3.8 05/29/2013 0420   CL 102 07/29/2019 1111   CL 106 05/29/2013 0420   CO2 20 07/29/2019 1111   CO2 25 05/29/2013 0420   GLUCOSE 108 (H) 07/29/2019 1111   GLUCOSE 93 06/26/2019 0421    GLUCOSE 95 05/29/2013 0420   BUN 10 07/29/2019 1111   BUN 5 (L) 05/29/2013 0420   CREATININE 0.69 07/29/2019 1111   CREATININE 0.81 05/29/2013 0420   CALCIUM 9.7 07/29/2019 1111   CALCIUM 8.5 05/29/2013 0420   GFRNONAA 102 07/29/2019 1111   GFRNONAA >60 05/29/2013 0420   GFRAA 117 07/29/2019 1111   GFRAA >60 05/29/2013 0420    BNP    Component Value Date/Time   BNP 33.0 06/26/2019 0048    ProBNP    Component Value Date/Time   PROBNP 14 06/29/2019 1606    Imaging: ECHOCARDIOGRAM COMPLETE  Result Date: 07/09/2019    ECHOCARDIOGRAM REPORT   Patient Name:   Batya KERRYN CADWALLADER Date of Exam: 07/09/2019 Medical Rec #:  [id number]          Height:       62.0 in Accession #:    [id number]         Weight:       349.0 lb Date of Birth:  12/12/1970           BSA:          2.422 m Patient Age:    78 years           BP:           136/78 mmHg Patient Gender: F                  HR:           120 bpm. Exam Location:  [address] Procedure: 2D Echo, 3D Echo, Cardiac Doppler and Color Doppler Indications:    I26.99 Pulmonary Embolism without Cor Pulmonale  History:        Patient has no prior history of Echocardiogram examinations.                 Signs/Symptoms:Chest Pain and Shortness of Breath; Risk                 Factors:Hypertension, Family History of  Coronary Artery Disease                 and Former Smoker. COVID 19 Virus (March 09, 2019), with                 worsending residual CP and SOB. Acute Pulmonary Embolism                 (06-26-19), Obesity, Edema.  Sonographer:    Deliah Boston RDCS Referring Phys: [id number] PAULA V ROSS IMPRESSIONS  1. Left ventricular ejection fraction, by estimation, is 55 to 60%. The left ventricle has normal function. The left ventricle has no regional wall motion abnormalities. There is mild asymmetric left ventricular hypertrophy of the posterior-lateral segment. Indeterminate diastolic filling due to E-A fusion.  2. Right ventricular systolic function is normal. The  right ventricular size is normal. Tricuspid regurgitation signal is inadequate for assessing PA pressure.  3. The mitral valve is normal in structure. No evidence of mitral valve regurgitation. No evidence of mitral stenosis.  4. The aortic valve is normal in structure. Aortic valve regurgitation is not visualized. No aortic stenosis is present.  5. The inferior vena cava is normal in size with greater than 50% respiratory variability, suggesting right atrial pressure of 3 mmHg. FINDINGS  Left Ventricle: Left ventricular ejection fraction, by estimation, is 55 to 60%. The left ventricle has normal function. The left ventricle has no regional wall motion abnormalities. The left ventricular internal cavity size was normal in size. There is  mild asymmetric left ventricular hypertrophy of the posterior-lateral segment. Indeterminate diastolic filling due to E-A fusion. Right Ventricle: The right ventricular size is normal. No increase in right ventricular wall thickness. Right ventricular systolic function is normal. Tricuspid regurgitation signal is inadequate for assessing PA pressure. Left Atrium: Left atrial size was normal in size. Right Atrium: Right atrial size was normal in size. Pericardium: There is no evidence of pericardial effusion. Mitral Valve: The mitral valve is normal in structure. Normal mobility of the mitral valve leaflets. No evidence of mitral valve regurgitation. No evidence of mitral valve stenosis. Tricuspid Valve: The tricuspid valve is normal in structure. Tricuspid valve regurgitation is not demonstrated. No evidence of tricuspid stenosis. Aortic Valve: The aortic valve is normal in structure. Aortic valve regurgitation is not visualized. No aortic stenosis is present. Pulmonic Valve: The pulmonic valve was normal in structure. Pulmonic valve regurgitation is not visualized. No evidence of pulmonic stenosis. Aorta: The aortic root is normal in size and structure. Venous: Prominent caval  flow into the RA. The inferior vena cava is normal in size with greater than 50% respiratory variability, suggesting right atrial pressure of 3 mmHg. IAS/Shunts: No atrial level shunt detected by color flow Doppler.  LEFT VENTRICLE PLAX 2D LVIDd:         3.69 cm  Diastology LVIDs:         2.74 cm  LV e' lateral:   17.50 cm/s LV PW:         1.17 cm  LV E/e' lateral: 3.9 LV IVS:        1.03 cm  LV e' medial:    10.70 cm/s LVOT diam:     2.25 cm  LV E/e' medial:  6.4 LV SV:         93 LV SV Index:   38 LVOT Area:     3.98 cm  LEFT ATRIUM             Index  RIGHT ATRIUM           Index LA diam:        3.45 cm 1.42 cm/m  RA Area:     18.20 cm LA Vol (A2C):   43.4 ml 17.92 ml/m RA Volume:   53.20 ml  21.97 ml/m LA Vol (A4C):   53.2 ml 21.97 ml/m LA Biplane Vol: 51.6 ml 21.30 ml/m  AORTIC VALVE LVOT Vmax:   111.00 cm/s LVOT Vmean:  73.250 cm/s LVOT VTI:    0.234 m  AORTA Ao Root diam: 3.35 cm Ao Asc diam:  3.10 cm MITRAL VALVE MV Area (PHT): cm         SHUNTS MV Decel Time: 149 msec    Systemic VTI:  0.23 m MV E velocity: 68.65 cm/s  Systemic Diam: 2.25 cm MV A velocity: 88.00 cm/s MV E/A ratio:  0.78 Cherlynn Kaiser MD Electronically signed by Cherlynn Kaiser MD Signature Date/Time: 07/09/2019/4:39:59 PM    Final      Assessment & Plan:   History of 2019 novel coronavirus disease (COVID-19) History of Covid History of PE Brain fog Memory loss Fatigue:  Please keep appointment with neurology - CRP elevated when checked yesterday by Dr. Joya Gaskins. May need MRI for further evaluation. Mini-Cog score in office today = 4.   Stay active  Please keep follow up appointments with pulmonary and PCP  Hand out given on post covid nutritional rehabilitation  Hand out given on fatigue and how to conserve energy  Hand out given on helping improve memory loss   Follow up here as needed      Fenton Foy, NP 07/30/2019

## 2019-07-30 NOTE — Patient Instructions (Addendum)
History of Covid History of PE Brain fog Memory loss Fatigue:  Please keep appointment with neurology  Stay active  Please keep follow up with pulmonary and PCP  Hand out given on post covid nutritional rehabilitation  Hand out given on fatigue and how to conserve energy  Hand out given on helping improve memory loss   Follow up as needed

## 2019-07-30 NOTE — Assessment & Plan Note (Signed)
History of Covid History of PE Brain fog Memory loss Fatigue:  Please keep appointment with neurology - CRP elevated when checked yesterday by Dr. Joya Gaskins. May need MRI for further evaluation. Mini-Cog score in office today = 4.   Stay active  Please keep follow up appointments with pulmonary and PCP  Hand out given on post covid nutritional rehabilitation  Hand out given on fatigue and how to conserve energy  Hand out given on helping improve memory loss   Follow up here as needed

## 2019-07-30 NOTE — Progress Notes (Signed)
Lower venous duplex       has been completed. Preliminary results can be found under CV proc through chart review. Dariusz Brase, BS, RDMS, RVT   

## 2019-08-03 ENCOUNTER — Other Ambulatory Visit: Payer: Self-pay | Admitting: Critical Care Medicine

## 2019-08-03 DIAGNOSIS — R0600 Dyspnea, unspecified: Secondary | ICD-10-CM

## 2019-08-17 NOTE — Progress Notes (Signed)
NEUROLOGY CONSULTATION NOTE  Karen [name] MRN: [id number] DOB: 1970/11/06  Referring provider: Nicolette Bang, DO Primary care provider: Nicolette Bang, DO  Reason for consult:  headache  HISTORY OF PRESENT ILLNESS: Karen [name] is a 49 year old right-handed black female who presents for headache.  History supplemented by hospital, cardiology and referring provider's notes.  She reports headaches since December 2020, following COVID19 infection.  She had COVID again in February.  They are severe throbbing bifrontal-temporal/occipital pain that lasts about 2 hours to all day and occurs daily.  She wakes up every morning with a headache. Associated with nausea, vomiting, phonophobia, photophobia and blurred vision or sees black spots.   She notes persistent left sided facial numbness which will sometimes droops when the headache gets back.  .  No specific triggers.  Tylenol helps.  She also has some positional dizziness and brain fog since COVID.  She was hospitalized in March with a pulmonary embolism.  She is now on Eliquis.  Current NSAIDS:  None.  Cannot take as she is on Eliquis Current analgesics:  Tylenol Current triptans:  none Current ergotamine:  none Current anti-emetic:  Zofran ODT 8mg  Current muscle relaxants:  none Current anti-anxiolytic:  none Current sleep aide:  none Current Antihypertensive medications:  Amlodipine; Lasix Current Antidepressant medications:  none Current Anticonvulsant medications:  none Current anti-CGRP:  none Current Vitamins/Herbal/Supplements:  none Current Antihistamines/Decongestants:  none Other therapy:  none Hormone/birth control:  none  Past NSAIDS:  none Past analgesics:  Fioricet Past abortive triptans:  none Past abortive ergotamine:  none Past muscle relaxants:  none Past anti-emetic:  none Past antihypertensive medications:  none Past antidepressant medications:  none Past anticonvulsant  medications:  Gabapentin (for carpal tunnel) Past anti-CGRP:  none Past vitamins/Herbal/Supplements:  none Past antihistamines/decongestants:  none Other past therapies:  none  CT head without contrast on 08/01/2016 personally reviewed and was normal.   PAST MEDICAL HISTORY: Past Medical History:  Diagnosis Date  . Acute pulmonary embolism ([place]) 06/26/2019  . Arthritis    Left knee  . Carpal tunnel syndrome of right wrist 02/10/2018  . Essential hypertension 10/26/2016  . Family history of early CAD   . Gallstone 06/25/2018  . Hypertension   . Incidental lung nodule 06/26/2019  . Menorrhagia 02/22/2015  . Morbid obesity ([place]) 11/10/2012   BMI 63   . Pain in right hand 01/27/2018  . PONV (postoperative nausea and vomiting)   . Right upper quadrant pain 06/25/2018  . Steatosis of liver 06/25/2018    PAST SURGICAL HISTORY: Past Surgical History:  Procedure Laterality Date  . CARPAL TUNNEL RELEASE Right 04/04/2018   Procedure: CARPAL TUNNEL RELEASE;  Surgeon: Latanya Maudlin, MD;  Location: [facility];  Service: Orthopedics;  Laterality: Right;  22min  . CHOLECYSTECTOMY    . gastic sleeve  2015  . HYSTEROSCOPY WITH NOVASURE N/A 03/29/2015   Procedure: HYSTEROSCOPY WITH NOVASURE;  Surgeon: Anastasio Auerbach, MD;  Location: [facility];  Service: Gynecology;  Laterality: N/A;  to follow first case around 10:15am.  Requests one hour OR time.  . INTRAUTERINE DEVICE INSERTION     X 2  Spontaneously extruded X 2.   2014/2015  . TUBAL LIGATION  approximately 2000   as an interval procedure historically    MEDICATIONS: Current Outpatient Medications on File Prior to Visit  Medication Sig Dispense Refill  . acetaminophen (TYLENOL) 500 MG tablet Take 500 mg by mouth every 6 (six) hours as needed  for moderate pain.    Marland Kitchen albuterol (VENTOLIN HFA) 108 (90 Base) MCG/ACT inhaler TAKE 2 PUFFS BY MOUTH EVERY 6 HOURS AS NEEDED FOR WHEEZE OR SHORTNESS OF BREATH 18 g 1  . amLODipine (NORVASC) 10 MG tablet  Take 1 tablet (10 mg total) by mouth daily. 90 tablet 0  . apixaban (ELIQUIS) 5 MG TABS tablet Take 1 tablet (5 mg total) by mouth 2 (two) times daily. 60 tablet 3  . b complex vitamins tablet Take 1 tablet by mouth daily.    . Calcium Carbonate-Vitamin D (CALCIUM-VITAMIN D) 600-125 MG-UNIT TABS Take 1 tablet by mouth daily.    . Cholecalciferol (VITAMIN D-3) 25 MCG (1000 UT) CAPS Take 2,000 Units by mouth daily.    . furosemide (LASIX) 40 MG tablet Take 1 tablet (40 mg total) by mouth every Monday. 30 tablet 3  . gabapentin (NEURONTIN) 300 MG capsule Take 300 mg by mouth at bedtime as needed (pain).     . Multiple Vitamins-Minerals (HAIR/SKIN/NAILS PO) Take 1 tablet by mouth daily.    . Omega-3 Fatty Acids (FISH OIL PO) Take 1 capsule by mouth daily.    . pantoprazole (PROTONIX) 40 MG tablet Take 1 tablet (40 mg total) by mouth daily. 30 tablet 3  . potassium chloride (KLOR-CON) 10 MEQ tablet Take 1 tablet (10 mEq total) by mouth every Monday. 30 tablet 3  . vitamin B-12 (CYANOCOBALAMIN) 1000 MCG tablet Take 5,000 mcg by mouth daily.     No current facility-administered medications on file prior to visit.    ALLERGIES: Allergies  Allergen Reactions  . Nsaids     History of gastric sleeve  . Other Other (See Comments)    Patient stated that she can only take chewable and liquid medication Patient says she can now swallow solid pills/capsules/caplets 06/25/2018    FAMILY HISTORY: Family History  Problem Relation Age of Onset  . Hyperlipidemia Father   . Heart attack Father   . Heart attack Other   . Hypertension Other   . Cancer Other   . Stroke Other   . Hypertension Mother   . Parkinson's disease Mother     SOCIAL HISTORY: Social History   Socioeconomic History  . Marital status: Married    Spouse name: Not on file  . Number of children: Not on file  . Years of education: Not on file  . Highest education level: Not on file  Occupational History  . Not on file    Tobacco Use  . Smoking status: Former Research scientist (life sciences)  . Smokeless tobacco: Never Used  Substance and Sexual Activity  . Alcohol use: Yes    Comment: occas   . Drug use: No  . Sexual activity: Yes    Birth control/protection: Surgical    Comment: BTL  Other Topics Concern  . Not on file  Social History Narrative  . Not on file   Social Determinants of Health   Financial Resource Strain:   . Difficulty of Paying Living Expenses:   Food Insecurity:   . Worried About Charity fundraiser in the Last Year:   . Arboriculturist in the Last Year:   Transportation Needs:   . Film/video editor (Medical):   Marland Kitchen Lack of Transportation (Non-Medical):   Physical Activity:   . Days of Exercise per Week:   . Minutes of Exercise per Session:   Stress:   . Feeling of Stress :   Social Connections:   . Frequency of  Communication with Friends and Family:   . Frequency of Social Gatherings with Friends and Family:   . Attends Religious Services:   . Active Member of Clubs or Organizations:   . Attends Archivist Meetings:   Marland Kitchen Marital Status:   Intimate Partner Violence:   . Fear of Current or Ex-Partner:   . Emotionally Abused:   Marland Kitchen Physically Abused:   . Sexually Abused:     REVIEW OF SYSTEMS: Constitutional: No fevers, chills, or sweats, no generalized fatigue, change in appetite Eyes: No visual changes, double vision, eye pain Ear, nose and throat: No hearing loss, ear pain, nasal congestion, sore throat Cardiovascular: No chest pain, palpitations Respiratory:  No shortness of breath at rest or with exertion, wheezes GastrointestinaI: No nausea, vomiting, diarrhea, abdominal pain, fecal incontinence Genitourinary:  No dysuria, urinary retention or frequency Musculoskeletal:  No neck pain, back pain Integumentary: No rash, pruritus, skin lesions Neurological: as above Psychiatric: No depression, insomnia, anxiety Endocrine: No palpitations, fatigue, diaphoresis, mood swings,  change in appetite, change in weight, increased thirst Hematologic/Lymphatic:  No purpura, petechiae. Allergic/Immunologic: no itchy/runny eyes, nasal congestion, recent allergic reactions, rashes  PHYSICAL EXAM: Blood pressure 131/87, pulse (!) 108, height 5\' 2"  (1.575 m), weight (!) 359 lb 3.2 oz (162.9 kg), SpO2 97 %. General: No acute distress.  Patient appears well-groomed.  Head:  Normocephalic/atraumatic Eyes:  fundi examined but not visualized Neck: supple, no paraspinal tenderness, full range of motion Back: No paraspinal tenderness Heart: regular rate and rhythm Lungs: Clear to auscultation bilaterally. Vascular: No carotid bruits. Neurological Exam: Mental status: alert and oriented to person, place, and time, recent and remote memory intact, fund of knowledge intact, attention and concentration intact, speech fluent and not dysarthric, language intact. Cranial nerves: CN I: not tested CN II: pupils equal, round and reactive to light, visual fields intact CN III, IV, VI:  full range of motion, no nystagmus, no ptosis CN V: reduced left V1-V3 sensation CN VII: upper and lower face symmetric CN VIII: hearing intact CN IX, X: gag intact, uvula midline CN XI: sternocleidomastoid and trapezius muscles intact CN XII: tongue midline Bulk & Tone: normal, no fasciculations. Motor:  5/5 throughout  Sensation:  Pinprick and vibration sensation intact. Deep Tendon Reflexes:  2+ throughout, toes downgoing.  Finger to nose testing:  Without dysmetria.  Heel to shin:  Without dysmetria.  Gait:  Mildly wide based. Romberg negative.  IMPRESSION: Chronic migraine without aura, without status migrainosus, not intractable Left sided facial numbness.  Likely residual symptom of chronic migraine but must rule out other etiology. COVID 19 infection  PLAN: 1.  CT of head to evaluate for secondary etiology of headaches and left facial numbness. 2.  Start topiramate 25mg  at bedtime for one  week, then 50mg  at bedtime.   3.  She will try Nurtec for rescue medication (Zofran for nausea).  She cannot take NSAIDs due to being on Eliquis.  Also, she cannot take triptans because of stroke-like symptom (facial droop/facial numbness) 4.  Limit use of pain relievers to no more than 2 days out of week to prevent risk of rebound or medication-overuse headache. 5.  Keep headache diary 6.  Follow up in 4 months.  Thank you for allowing me to take part in the care of this patient.  Metta Clines, DO  CC:  Nicolette Bang, DO

## 2019-08-18 ENCOUNTER — Other Ambulatory Visit: Payer: Self-pay | Admitting: Nurse Practitioner

## 2019-08-18 ENCOUNTER — Ambulatory Visit (INDEPENDENT_AMBULATORY_CARE_PROVIDER_SITE_OTHER): Payer: 59 | Admitting: Neurology

## 2019-08-18 ENCOUNTER — Encounter: Payer: Self-pay | Admitting: Neurology

## 2019-08-18 ENCOUNTER — Other Ambulatory Visit: Payer: Self-pay

## 2019-08-18 VITALS — BP 131/87 | HR 108 | Ht 62.0 in | Wt 359.2 lb

## 2019-08-18 DIAGNOSIS — Z8616 Personal history of COVID-19: Secondary | ICD-10-CM

## 2019-08-18 DIAGNOSIS — G43709 Chronic migraine without aura, not intractable, without status migrainosus: Secondary | ICD-10-CM

## 2019-08-18 DIAGNOSIS — R200 Anesthesia of skin: Secondary | ICD-10-CM | POA: Diagnosis not present

## 2019-08-18 MED ORDER — ONDANSETRON 8 MG PO TBDP: 8.0000 mg | ORAL_TABLET | Freq: Three times a day (TID) | ORAL | 3 refills | Status: DC | PRN

## 2019-08-18 MED ORDER — TOPIRAMATE 25 MG PO TABS: ORAL_TABLET | ORAL | 0 refills | Status: DC

## 2019-08-18 NOTE — Patient Instructions (Addendum)
1.  Start topiramate 25mg  tablet.  Take 1 tablet at bedtime for one week, then increase to 2 tablets at bedtime.  Contact me in 5 weeks with update for refill and whether we need to increase dose. 2.  Stop Tylenol.  When you get a headache, take Nurtec 1 tablet. No more than 1 tablet in 24 hours. 3.  Take ondansetron for nausea as needed. 4.  Limit use of pain relievers to no more than 2 days out of week to prevent risk of rebound or medication-overuse headache. 5.  Keep headache diary. 6.  Due to facial numbness, will check CT of head without contrast. We have sent a referral to [facility] for your MRI and they will call you directly to schedule your appointment. They are located at [place]. If you need to contact them directly please call [phone].  7.  Follow up in 4 months.

## 2019-08-25 ENCOUNTER — Telehealth: Payer: Self-pay | Admitting: *Deleted

## 2019-08-25 NOTE — Telephone Encounter (Signed)
    [facility] Pre-operative Risk Assessment    HEARTCARE STAFF: - Please ensure there is not already an duplicate clearance open for this procedure - Under Visit Info/Reason for Call, type in Other and utilize the format Clearance MM/DD/YY or Clearance TBD  Request for surgical clearance:  1. What type of surgery is being performed?   BARIATRIC REVISION / NOT SURE WHICH PROCEDURE. (TRIED TO CALL THE OFFICE) LEFT A MESSAGE  2. When is this surgery scheduled?  TBD   3. What type of clearance is required (medical clearance vs. Pharmacy clearance to hold med vs. Both)?  BOTH  4. Are there any medications that need to be held prior to surgery and how long? ELIQUIS   5. Practice name and name of physician performing surgery?  [facility]  6. What is the office phone number?  [phone]   7.   What is the office fax number?  [phone]  8.   Anesthesia type (None, local, MAC, general) ?     Jeanann Lewandowsky 08/25/2019, 11:51 AM  _________________________________________________________________   (provider comments below)

## 2019-08-26 NOTE — Telephone Encounter (Signed)
    Pre-op team  Please see notes from pharmacy above Eliquis. Patient is on secondary to PE which is followed by Dr. Phill Myron. Holding recommendations need to come from her.   Thank you Kathyrn Drown NP-C

## 2019-08-26 NOTE — Telephone Encounter (Signed)
Pt takes Eliquis for PE diagnosed 2 months ago which is being managed by Dr Phill Myron.  Anticoagulation clearance will need to come from managing MD.

## 2019-08-26 NOTE — Telephone Encounter (Signed)
I will fax these notes to requesting office with there recommendations in regards to Eliquis. I will remove from the pre op call back pool.

## 2019-08-28 NOTE — Telephone Encounter (Signed)
Christian from Dr. Synthia Innocent office called as there was some confusion. I returned the call to [place] who was calling to inform us that the procedure being done is going to be a Duodenal Switch. She states that our office was asking for clarification on which procedure was being done. Darrick Meigs is aware that they will reach out to the PCP Phill Myron, DO in regards to clearance for the Eliquis. Christian did state as pt see's Dr. Harrington Challenger they wanted to make sure that from a cardiac respective that the pt is cleared as well and wants to make sure the pt does not need any further testing to be cleared from a cardiac standpoint. I assured Darrick Meigs I will let our Pre Op Team know of the update and we will let the surgeon's office know if pt has been cleared from a cardiac standpoint.

## 2019-08-28 NOTE — Telephone Encounter (Signed)
Follow Up  I received a call from [place] from Dr. Synthia Innocent office from [facility] and she said that she needs clarification as regards to the medical clearance that was sent. she said she's very confused about what's going on.  Please call back to discuss

## 2019-08-31 NOTE — Telephone Encounter (Addendum)
   Primary Cardiologist: Dorris Carnes, MD  Chart reviewed as part of pre-operative protocol coverage. Given past medical history and time since last visit, based on ACC/AHA guidelines, Cyanna J Vanalst would be at acceptable risk for the planned procedure without further cardiovascular testing.   I will route this recommendation to the requesting party via Epic fax function and remove from pre-op pool.  Please call with questions.  Jossie Ng. Tahara Ruffini NP-C    08/31/2019, 9:09 AM [address] Office [phone] Fax [phone]

## 2019-09-04 ENCOUNTER — Telehealth: Payer: Self-pay | Admitting: Family Medicine

## 2019-09-04 NOTE — Telephone Encounter (Signed)
I called the patient. She asked that we fax her last ov note, which states she has OA of the knees, to [place] at [facility] [phone]. They need to submit this information to the insurance company in preparation for bariatric surgery. She needed this today. Faxed at 4:52 pm.

## 2019-09-04 NOTE — Telephone Encounter (Signed)
Patient called.  Did not disclose why but she is requesting a call back from [place]   Call back: [phone]

## 2019-09-08 ENCOUNTER — Telehealth: Payer: Self-pay | Admitting: Family Medicine

## 2019-09-08 ENCOUNTER — Encounter: Payer: Self-pay | Admitting: Family Medicine

## 2019-09-08 NOTE — Telephone Encounter (Signed)
The patient is in the process of work up for bariatric surgery. The bariatric office [facility]) needs to have a statement that weight loss will improve her bilateral knee pain. This will then need to be sent to that office (same as where the last office visit note was sent on 09/04/19).

## 2019-09-08 NOTE — Telephone Encounter (Signed)
Printed

## 2019-09-08 NOTE — Telephone Encounter (Signed)
Duplicate message. See other message from today, same subject.

## 2019-09-08 NOTE — Telephone Encounter (Signed)
Patient called requesting a call back in regards to a treatment plan.  CB#[phone].  Thank you.

## 2019-09-08 NOTE — Telephone Encounter (Signed)
Pt called stating she's unable to do anything with the letter Terri left her in [place]; so she's asking Terri to Fax it to [place]. Pt states she would like for terri to message her when that's been done.   [place] Fax#  [phone]

## 2019-09-08 NOTE — Telephone Encounter (Signed)
I called and advised the patient the new dictated note has been faxed successfully to [facility] # [phone].

## 2019-09-09 ENCOUNTER — Other Ambulatory Visit: Payer: Self-pay | Admitting: Neurology

## 2019-09-18 ENCOUNTER — Telehealth: Payer: Self-pay | Admitting: Internal Medicine

## 2019-09-18 NOTE — Telephone Encounter (Signed)
We can do her FMLA here.   Phill Myron, D.O. Primary Care at [facility]  09/18/2019, 11:03 AM

## 2019-09-18 NOTE — Telephone Encounter (Signed)
Pt is aware.  

## 2019-09-18 NOTE — Telephone Encounter (Signed)
Pt called job wants her to take a FMLA due to covid she wants to know if you will due the paperwork or the other doctor you sent her to

## 2019-09-22 ENCOUNTER — Telehealth: Payer: 59 | Admitting: Internal Medicine

## 2019-09-24 ENCOUNTER — Telehealth (INDEPENDENT_AMBULATORY_CARE_PROVIDER_SITE_OTHER): Payer: 59 | Admitting: Internal Medicine

## 2019-09-24 ENCOUNTER — Ambulatory Visit: Payer: 59

## 2019-09-24 ENCOUNTER — Encounter: Payer: Self-pay | Admitting: Internal Medicine

## 2019-09-24 DIAGNOSIS — Z8616 Personal history of COVID-19: Secondary | ICD-10-CM | POA: Diagnosis not present

## 2019-09-24 DIAGNOSIS — Z0289 Encounter for other administrative examinations: Secondary | ICD-10-CM | POA: Diagnosis not present

## 2019-09-24 DIAGNOSIS — I2699 Other pulmonary embolism without acute cor pulmonale: Secondary | ICD-10-CM

## 2019-09-24 MED ORDER — APIXABAN 5 MG PO TABS: 5.0000 mg | ORAL_TABLET | Freq: Two times a day (BID) | ORAL | 0 refills | Status: DC

## 2019-09-24 NOTE — Progress Notes (Signed)
Virtual Visit via Telephone Note  I connected with Karen [name], on 09/24/2019 at 10:28 AM by telephone due to the COVID-19 pandemic and verified that I am speaking with the correct person using two identifiers.   Consent: I discussed the limitations, risks, security and privacy concerns of performing an evaluation and management service by telephone and the availability of in person appointments. I also discussed with the patient that there may be a patient responsible charge related to this service. The patient expressed understanding and agreed to proceed.   Location of Patient: Home   Location of Provider: Clinic    Persons participating in Telemedicine visit: Nadea J Marla Roe [facility] Dr. Juleen China      History of Present Illness: Patient has a visit for FMLA paperwork related to COVID-19 with provoked PE. 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 She has been out of work since 03/09/20 after being diagnosed with COVID on Nov 30. She still has SOB, voice hoarseness, loss of voice, migraine headaches, periods of tachycardia, hot flashes, brain fog. Performing daily tasks has been limited due to fatigue. She has been followed by respiratory/COVID clinic, neurology, cardiology, and pulmonology for this. She is on Eliquis. Needs a 90 day supply to be filled by pharmacy.    Past Medical History:  Diagnosis Date  . Acute pulmonary embolism ([place]) 06/26/2019  . Arthritis    Left knee  . Carpal tunnel syndrome of right wrist 02/10/2018  . Essential hypertension 10/26/2016  . Family history of early CAD   . Gallstone 06/25/2018  . Hypertension   . Incidental lung nodule 06/26/2019  . Menorrhagia 02/22/2015  . Morbid obesity ([place]) 11/10/2012   BMI 63   . Pain in right hand 01/27/2018  . PONV (postoperative nausea and vomiting)   . Right upper quadrant pain 06/25/2018  . Steatosis of liver 06/25/2018   Allergies  Allergen Reactions  . Nsaids      History of gastric sleeve  . Other Other (See Comments)    Patient stated that she can only take chewable and liquid medication Patient says she can now swallow solid pills/capsules/caplets 06/25/2018    Current Outpatient Medications on File Prior to Visit  Medication Sig Dispense Refill  . albuterol (VENTOLIN HFA) 108 (90 Base) MCG/ACT inhaler TAKE 2 PUFFS BY MOUTH EVERY 6 HOURS AS NEEDED FOR WHEEZE OR SHORTNESS OF BREATH 18 g 1  . amLODipine (NORVASC) 10 MG tablet Take 1 tablet (10 mg total) by mouth daily. 90 tablet 0  . b complex vitamins tablet Take 1 tablet by mouth daily.    . butalbital-acetaminophen-caffeine (FIORICET) 50-325-40 MG tablet Take 50-235 tablets by mouth as needed.    . Calcium Carbonate-Vitamin D (CALCIUM-VITAMIN D) 600-125 MG-UNIT TABS Take 1 tablet by mouth daily.    . Cholecalciferol (VITAMIN D-3) 25 MCG (1000 UT) CAPS Take 2,000 Units by mouth daily.    . furosemide (LASIX) 40 MG tablet Take 1 tablet (40 mg total) by mouth every Monday. 30 tablet 3  . gabapentin (NEURONTIN) 300 MG capsule Take 300 mg by mouth at bedtime as needed (pain).     . Multiple Vitamins-Minerals (HAIR/SKIN/NAILS PO) Take 1 tablet by mouth daily.    . Omega-3 Fatty Acids (FISH OIL PO) Take 1 capsule by mouth daily.    . ondansetron (ZOFRAN ODT) 8 MG disintegrating tablet Take 1 tablet (8 mg total) by mouth every 8 (eight) hours as needed for nausea or vomiting.  30 tablet 3  . pantoprazole (PROTONIX) 40 MG tablet Take 1 tablet (40 mg total) by mouth daily. 30 tablet 3  . potassium chloride (KLOR-CON) 10 MEQ tablet Take 1 tablet (10 mEq total) by mouth every Monday. 30 tablet 3  . topiramate (TOPAMAX) 25 MG tablet TAKE 1 TABLET AT BEDTIME FOR ONE WEEK, THEN INCREASE TO 2 TABLETS AT BEDTIME 60 tablet 3  . vitamin B-12 (CYANOCOBALAMIN) 1000 MCG tablet Take 5,000 mcg by mouth daily.     No current facility-administered medications on file prior to visit.    Observations/Objective: NAD.  Speaking clearly.  Work of breathing normal.  Alert and oriented. Mood appropriate.   Assessment and Plan: 1. Encounter for completion of form with patient Spent >20 minutes on the phone with patient completing the FMLA paperwork required. An additional 20 minutes was spent reviewing the chart including imaging, specialist notes, medications, and future appointments.   2. Acute pulmonary embolism, unspecified pulmonary embolism type, unspecified whether acute cor pulmonale present (HCC) - apixaban (ELIQUIS) 5 MG TABS tablet; Take 1 tablet (5 mg total) by mouth 2 (two) times daily.  Dispense: 180 tablet; Refill: 0  3. History of 2019 novel coronavirus disease (COVID-19) Patient is still symptomatic and daily tasks are very limited due to her symptomatic state. She is unable to perform her job duties at present.    Follow Up Instructions: Routine medical care as planned    I discussed the assessment and treatment plan with the patient. The patient was provided an opportunity to ask questions and all were answered. The patient agreed with the plan and demonstrated an understanding of the instructions.   The patient was advised to call back or seek an in-person evaluation if the symptoms worsen or if the condition fails to improve as anticipated.   I provided 45 minutes total of non-face-to-face time during this encounter including median intraservice time, reviewing previous notes, investigations, ordering medications, medical decision making, coordinating care and patient verbalized understanding at the end of the visit.    Phill Myron, D.O. Primary Care at [facility]  09/24/2019, 10:28 AM

## 2019-10-01 ENCOUNTER — Other Ambulatory Visit: Payer: Self-pay

## 2019-10-01 ENCOUNTER — Ambulatory Visit (INDEPENDENT_AMBULATORY_CARE_PROVIDER_SITE_OTHER): Payer: 59 | Admitting: Nurse Practitioner

## 2019-10-01 VITALS — HR 146 | Temp 97.7°F

## 2019-10-01 DIAGNOSIS — R5381 Other malaise: Secondary | ICD-10-CM | POA: Diagnosis not present

## 2019-10-01 DIAGNOSIS — R0600 Dyspnea, unspecified: Secondary | ICD-10-CM | POA: Diagnosis not present

## 2019-10-01 DIAGNOSIS — I2699 Other pulmonary embolism without acute cor pulmonale: Secondary | ICD-10-CM

## 2019-10-01 DIAGNOSIS — R0609 Other forms of dyspnea: Secondary | ICD-10-CM

## 2019-10-01 DIAGNOSIS — Z8616 Personal history of COVID-19: Secondary | ICD-10-CM

## 2019-10-01 NOTE — Progress Notes (Signed)
@Patient  ID: Karen [name], female    DOB: 12-May-1970, 49 y.o.   MRN: [id number]  Chief Complaint  Patient presents with  . Follow-up    history of covid, tachycardia, fatigue, memory loss, shortness of breath    Referring provider: Caryl Never*   49 year old female with history of obesity, HTN. Diagnosed with Covid November 2020 and March 2021.   Recent Significant Encounters:   06/25/19 - 06/26/19 Hospital Admission: Admitted for PE. Doppler lower ext negative for DVT. Started on Eliquis. Incidental lung nodule - repeat CT in 6-12 months.   07/29/19 Pulmonary: Continue apixaban for 12 months, ordered overnight sleep oximetry, repeat vascular US, referred to counseling for depression, following lung nodule. Referred to bariatric clinic for obesity.   08/18/19 Neurology: Migraine medications changed, CT of head ordered, advised to keep headache diary  Tests:   Imaging:   06/25/19 CTA: Peripheral pulmonary embolism, nonocclusive. No right heart strain. A 6 mm right middle lobe subsolid fissural based nodule of unknown stability. Initial follow-up with CT at 6-12 months is recommended to confirm persistence. If persistent, repeat CT is recommended every 2 years until 5 years of stability has been established. This recommendation follows the consensus statement: Guidelines for Management of Incidental Pulmonary Nodules Detected on CT Images: From the Fleischner Society 2017; Radiology 2017; 284:228-243. Mild three-vessel coronary calcification.   HPI  Patient presents for post Covid care clinic visit follow-up.  Patient reports she did for episodes of Covid her first illness was in November 2020 and her second illness was February 2021.  Patient was recently diagnosed with PE at last hospital admission with Covid.  She is compliant with Eliquis.  Since her last visit here she has followed up with neurology and her PCP.  She is scheduled to have a sleep study done.  She  has also followed up with bariatrics and is scheduled for gastric bypass surgery next month.  Patient states that she continues to have ongoing tachycardia, memory loss, and shortness of breath with exertion.  She has been followed by cardiology for the tachycardia.  Her last echo was overall normal.  Patient does still complain of fatigue and has not been physically active.  Patient has been checking her O2 sats at home and states that they do stay in the upper 90s on room air.  Denies f/c/s, n/v/d, hemoptysis, PND, chest pain or edema.     Allergies  Allergen Reactions  . Nsaids     History of gastric sleeve  . Other Other (See Comments)    Patient stated that she can only take chewable and liquid medication Patient says she can now swallow solid pills/capsules/caplets 06/25/2018    Immunization History  Administered Date(s) Administered  . Tdap 04/09/2014    Past Medical History:  Diagnosis Date  . Acute pulmonary embolism ([place]) 06/26/2019  . Arthritis    Left knee  . Carpal tunnel syndrome of right wrist 02/10/2018  . Essential hypertension 10/26/2016  . Family history of early CAD   . Gallstone 06/25/2018  . Hypertension   . Incidental lung nodule 06/26/2019  . Menorrhagia 02/22/2015  . Morbid obesity ([place]) 11/10/2012   BMI 63   . Pain in right hand 01/27/2018  . PONV (postoperative nausea and vomiting)   . Right upper quadrant pain 06/25/2018  . Steatosis of liver 06/25/2018    Tobacco History: Social History   Tobacco Use  Smoking Status Former Smoker  Smokeless Tobacco Never Used  Counseling given: Yes   Outpatient Encounter Medications as of 10/01/2019  Medication Sig  . albuterol (VENTOLIN HFA) 108 (90 Base) MCG/ACT inhaler TAKE 2 PUFFS BY MOUTH EVERY 6 HOURS AS NEEDED FOR WHEEZE OR SHORTNESS OF BREATH  . amLODipine (NORVASC) 10 MG tablet Take 1 tablet (10 mg total) by mouth daily.  Marland Kitchen apixaban (ELIQUIS) 5 MG TABS tablet Take 1 tablet (5 mg total) by mouth 2 (two)  times daily.  Marland Kitchen b complex vitamins tablet Take 1 tablet by mouth daily.  . butalbital-acetaminophen-caffeine (FIORICET) 50-325-40 MG tablet Take 50-235 tablets by mouth as needed.  . Calcium Carbonate-Vitamin D (CALCIUM-VITAMIN D) 600-125 MG-UNIT TABS Take 1 tablet by mouth daily.  . Cholecalciferol (VITAMIN D-3) 25 MCG (1000 UT) CAPS Take 2,000 Units by mouth daily.  . furosemide (LASIX) 40 MG tablet Take 1 tablet (40 mg total) by mouth every Monday.  . gabapentin (NEURONTIN) 300 MG capsule Take 300 mg by mouth at bedtime as needed (pain).   . Multiple Vitamins-Minerals (HAIR/SKIN/NAILS PO) Take 1 tablet by mouth daily.  . Omega-3 Fatty Acids (FISH OIL PO) Take 1 capsule by mouth daily.  . ondansetron (ZOFRAN ODT) 8 MG disintegrating tablet Take 1 tablet (8 mg total) by mouth every 8 (eight) hours as needed for nausea or vomiting.  . pantoprazole (PROTONIX) 40 MG tablet Take 1 tablet (40 mg total) by mouth daily.  . potassium chloride (KLOR-CON) 10 MEQ tablet Take 1 tablet (10 mEq total) by mouth every Monday.  . topiramate (TOPAMAX) 25 MG tablet TAKE 1 TABLET AT BEDTIME FOR ONE WEEK, THEN INCREASE TO 2 TABLETS AT BEDTIME  . vitamin B-12 (CYANOCOBALAMIN) 1000 MCG tablet Take 5,000 mcg by mouth daily.   No facility-administered encounter medications on file as of 10/01/2019.     Review of Systems  Review of Systems  Constitutional: Positive for fatigue. Negative for chills and fever.  HENT: Negative.   Respiratory: Positive for shortness of breath. Negative for cough.   Cardiovascular: Positive for palpitations. Negative for chest pain and leg swelling.  Gastrointestinal: Negative.   Allergic/Immunologic: Negative.   Neurological: Negative.   Psychiatric/Behavioral: Positive for decreased concentration.       Physical Exam  Pulse (!) 146   Temp 97.7 F (36.5 C)   SpO2 97% Comment: RA  Wt Readings from Last 5 Encounters:  08/18/19 (!) 359 lb 3.2 oz (162.9 kg)  07/30/19 (!)  357 lb (161.9 kg)  07/29/19 (!) 356 lb 6.4 oz (161.7 kg)  06/29/19 (!) 349 lb (158.3 kg)  06/26/19 (!) 349 lb (158.3 kg)     Physical Exam Vitals and nursing note reviewed.  Constitutional:      General: She is not in acute distress.    Appearance: She is well-developed.  Cardiovascular:     Rate and Rhythm: Regular rhythm. Tachycardia present.  Pulmonary:     Effort: Pulmonary effort is normal.     Breath sounds: Normal breath sounds.  Neurological:     Mental Status: She is alert and oriented to person, place, and time.      Lab Results:  CBC    Component Value Date/Time   WBC 8.0 07/29/2019 1111   WBC 9.1 06/26/2019 0421   RBC 4.87 07/29/2019 1111   RBC 4.50 06/26/2019 0421   HGB 13.5 07/29/2019 1111   HCT 40.8 07/29/2019 1111   PLT 313 07/29/2019 1111   MCV 84 07/29/2019 1111   MCV 81 05/29/2013 0420   MCH 27.7 07/29/2019 1111  MCH 27.6 06/26/2019 0421   MCHC 33.1 07/29/2019 1111   MCHC 31.1 06/26/2019 0421   RDW 13.1 07/29/2019 1111   RDW 16.6 (H) 05/29/2013 0420   LYMPHSABS 1.5 07/29/2019 1111   LYMPHSABS 1.2 05/29/2013 0420   MONOABS 0.9 06/25/2019 2209   MONOABS 0.9 05/29/2013 0420   EOSABS 0.1 07/29/2019 1111   EOSABS 0.0 05/29/2013 0420   BASOSABS 0.0 07/29/2019 1111   BASOSABS 0.0 05/29/2013 0420    BMET    Component Value Date/Time   NA 138 07/29/2019 1111   NA 137 05/29/2013 0420   K 4.4 07/29/2019 1111   K 3.8 05/29/2013 0420   CL 102 07/29/2019 1111   CL 106 05/29/2013 0420   CO2 20 07/29/2019 1111   CO2 25 05/29/2013 0420   GLUCOSE 108 (H) 07/29/2019 1111   GLUCOSE 93 06/26/2019 0421   GLUCOSE 95 05/29/2013 0420   BUN 10 07/29/2019 1111   BUN 5 (L) 05/29/2013 0420   CREATININE 0.69 07/29/2019 1111   CREATININE 0.81 05/29/2013 0420   CALCIUM 9.7 07/29/2019 1111   CALCIUM 8.5 05/29/2013 0420   GFRNONAA 102 07/29/2019 1111   GFRNONAA >60 05/29/2013 0420   GFRAA 117 07/29/2019 1111   GFRAA >60 05/29/2013 0420       Assessment & Plan:   History of 2019 novel coronavirus disease (COVID-19) History of PE Shortness of breath:  Will place referral to pulmonary  Will order PT  May use albuterol as needed for shortness of breath  Continue anticoagulation therapy as directed by PCP  Brain fog Memory loss:   Please keep follow up appointment with neurology  Hand out given on helping improve memory loss  Fatigue:  Will order PT  Tachycardia:  Follow up appointment made with cardiology - concerned for ongoing tachycardia post covid   Follow up:  Follow up in 3 months or sooner if needed      Fenton Foy, NP 10/02/2019

## 2019-10-01 NOTE — Patient Instructions (Addendum)
History of Covid History of PE Shortness of breath:  Will place referral to pulmonary  Will order PT  May use albuterol as needed for shortness of breath  Brain fog Memory loss:   Please keep follow up appointment with neurology  Hand out given on helping improve memory loss  Fatigue:  Will order PT  Tachycardia:  Follow up appointment made with cardiology - concerned for ongoing tachycardia post covid   Follow up:  Follow up in 3 months or sooner if needed

## 2019-10-02 NOTE — Assessment & Plan Note (Signed)
History of PE Shortness of breath:  Will place referral to pulmonary  Will order PT  May use albuterol as needed for shortness of breath  Continue anticoagulation therapy as directed by PCP  Brain fog Memory loss:   Please keep follow up appointment with neurology  Hand out given on helping improve memory loss  Fatigue:  Will order PT  Tachycardia:  Follow up appointment made with cardiology - concerned for ongoing tachycardia post covid   Follow up:  Follow up in 3 months or sooner if needed

## 2019-10-08 NOTE — Progress Notes (Signed)
Cardiology Office Note   Date:  10/13/2019   ID:  Burnette Karen [name], DOB 11/25/1970, MRN [id number]  PCP:  Nicolette Bang, DO  Cardiologist:   Dorris Carnes, MD   Pt referred by Karen [name] for chest pressure, SOB    History of Present Illness: Karen [name] is a 49 y.o. female who is followed by Karen [name]   She has a hx of HTN, morbid obesity.   She said she felt OK until November 2020 when she developed COVID  Was SOB   This got worse in Dec, Jan   Improved some in Feb and then got worse at end of Feb 2021  SOB was at rest and with exertion. In Feb she also developed chest tightness, heaviness that radiated to arm.  The pt went to ED in March 2021.  CTA done and it was + for PE   Found to be + again for COVID  LE dopplers were negative for DVT   She was discharged on Eliqus    I saw her after this in clinic   I ordered an echocardiograim   This showed that both the RV and LV pumped normally    I recomm trial of intemitt lasix with K   Limit salt and fluid intake    Since I saw her she has been seen again in pulmonary.   REcomm continued apixaban for 12 months.  They also recomm overnight pulse ox, LE dopplers   ANd, they referred to bariatric surgery     The pt says her breathing is about the same   She denies CP    Has appt with bariatric surgery for procedure this month   Needs to come off of Eliquis for this    Pt has strong FHX of CAD; father had Hx of early CAD   Current Meds  Medication Sig  . acetaminophen (TYLENOL) 500 MG tablet Take by mouth.  Marland Kitchen albuterol (VENTOLIN HFA) 108 (90 Base) MCG/ACT inhaler TAKE 2 PUFFS BY MOUTH EVERY 6 HOURS AS NEEDED FOR WHEEZE OR SHORTNESS OF BREATH  . amLODipine (NORVASC) 10 MG tablet Take 1 tablet (10 mg total) by mouth daily.  Marland Kitchen apixaban (ELIQUIS) 5 MG TABS tablet Take 1 tablet (5 mg total) by mouth 2 (two) times daily.  Marland Kitchen b complex vitamins tablet Take 1 tablet by mouth daily.  . butalbital-acetaminophen-caffeine  (FIORICET) 50-325-40 MG tablet Take 50-235 tablets by mouth as needed.  . Calcium Carbonate-Vitamin D (CALCIUM-VITAMIN D) 600-125 MG-UNIT TABS Take 1 tablet by mouth daily.  . Cholecalciferol (VITAMIN D-3) 25 MCG (1000 UT) CAPS Take 2,000 Units by mouth daily.  . furosemide (LASIX) 40 MG tablet Take 1 tablet (40 mg total) by mouth every Monday.  . gabapentin (NEURONTIN) 300 MG capsule Take 300 mg by mouth at bedtime as needed (pain).   . Multiple Vitamins-Minerals (HAIR/SKIN/NAILS PO) Take 1 tablet by mouth daily.  . Omega-3 Fatty Acids (FISH OIL PO) Take 1 capsule by mouth daily.  . ondansetron (ZOFRAN ODT) 8 MG disintegrating tablet Take 1 tablet (8 mg total) by mouth every 8 (eight) hours as needed for nausea or vomiting.  . pantoprazole (PROTONIX) 40 MG tablet Take 1 tablet (40 mg total) by mouth daily.  . potassium chloride (KLOR-CON) 10 MEQ tablet Take 1 tablet (10 mEq total) by mouth every Monday.  . topiramate (TOPAMAX) 25 MG tablet TAKE 1 TABLET AT BEDTIME FOR ONE WEEK, THEN INCREASE TO 2 TABLETS AT BEDTIME  .  vitamin B-12 (CYANOCOBALAMIN) 1000 MCG tablet Take 5,000 mcg by mouth daily.     Allergies:   Nsaids and Other   Past Medical History:  Diagnosis Date  . Acute pulmonary embolism ([place]) 06/26/2019  . Arthritis    Left knee  . Carpal tunnel syndrome of right wrist 02/10/2018  . Essential hypertension 10/26/2016  . Family history of early CAD   . Gallstone 06/25/2018  . Hypertension   . Incidental lung nodule 06/26/2019  . Menorrhagia 02/22/2015  . Morbid obesity ([place]) 11/10/2012   BMI 63   . Pain in right hand 01/27/2018  . PONV (postoperative nausea and vomiting)   . Right upper quadrant pain 06/25/2018  . Steatosis of liver 06/25/2018    Past Surgical History:  Procedure Laterality Date  . CARPAL TUNNEL RELEASE Right 04/04/2018   Procedure: CARPAL TUNNEL RELEASE;  Surgeon: Latanya Maudlin, MD;  Location: [facility];  Service: Orthopedics;  Laterality: Right;  49min  .  CHOLECYSTECTOMY    . gastic sleeve  2015  . HYSTEROSCOPY WITH NOVASURE N/A 03/29/2015   Procedure: HYSTEROSCOPY WITH NOVASURE;  Surgeon: Anastasio Auerbach, MD;  Location: [facility];  Service: Gynecology;  Laterality: N/A;  to follow first case around 10:15am.  Requests one hour OR time.  . INTRAUTERINE DEVICE INSERTION     X 2  Spontaneously extruded X 2.   2014/2015  . TUBAL LIGATION  approximately 2000   as an interval procedure historically     Social History:  The patient  reports that she has quit smoking. She has never used smokeless tobacco. She reports current alcohol use. She reports that she does not use drugs.   Family History:  The patient's family history includes Cancer in an other family member; Heart attack in her father and another family member; Hyperlipidemia in her father; Hypertension in her mother and another family member; Parkinson's disease in her mother; Stroke in an other family member.    ROS:  Please see the history of present illness. All other systems are reviewed and  Negative to the above problem except as noted.    PHYSICAL EXAM: VS:  BP 131/84   Pulse 93   Ht 5\' 3"  (1.6 m)   Wt (!) 360 lb 6.4 oz (163.5 kg)   SpO2 98%   BMI 63.84 kg/m   GEN: Morbidly obese 49 yo  in no acute distress  HEENT: normal  Neck: no obvious JVD Cardiac: RRR; no murmurs, rubs, or gallops, 1+ LE edema  Respiratory:  Rel clear to auscul  No wheezes   No rales GI: soft, Obese   nontender  No RUQ tenderness  MS: no deformity Moving all extremities   Skin: warm and dry, no rash Neuro:  Strength and sensation are intact Psych: euthymic mood, full affect   EKG:  EKG is not ordered today.      CT scan 06/25/19 IMPRESSION: Peripheral pulmonary embolism, nonocclusive.  No right heart strain.  A 6 mm right middle lobe subsolid fissural based nodule of unknown stability. Initial follow-up with CT at 6-12 months is recommended to confirm persistence. If persistent, repeat CT  is recommended every 2 years until 5 years of stability has been established. This recommendation follows the consensus statement: Guidelines for Management of Incidental Pulmonary Nodules Detected on CT Images: From the Fleischner Society 2017; Radiology 2017; 284:228-243.  Mild three-vessel coronary calcification.  Lipid Panel    Component Value Date/Time   CHOL 168 10/09/2019 1220   TRIG  64 10/09/2019 1220   HDL 41 10/09/2019 1220   CHOLHDL 4.1 10/09/2019 1220   CHOLHDL 4.7 11/10/2012 0955   VLDL 14 11/10/2012 0955   LDLCALC 115 (H) 10/09/2019 1220      Wt Readings from Last 3 Encounters:  10/09/19 (!) 360 lb 6.4 oz (163.5 kg)  08/18/19 (!) 359 lb 3.2 oz (162.9 kg)  07/30/19 (!) 357 lb (161.9 kg)      ASSESSMENT AND PLAN:  1  Dyspnea.  Relatively stable   Multifactoral (PE, morbid obesity)  I do not think she is signif volume overloaded   Watch salt  2   CAD  Pt with mild calcifications.   No symptoms to sugg angina   3  Lipids   Check lipids today      4  HTN   BP is  OK  5  Bariatric surgery   Will forward to pulmonary re discontinuing Eliquis   F/U in clinic this winter    Current medicines are reviewed at length with the patient today.  The patient does not have concerns regarding medicines.  Signed, Dorris Carnes, MD  10/13/2019 12:00 AM    [address], [place], [place]  [address] Phone: [phone]; Fax: [phone]

## 2019-10-09 ENCOUNTER — Encounter: Payer: Self-pay | Admitting: Internal Medicine

## 2019-10-09 ENCOUNTER — Ambulatory Visit (INDEPENDENT_AMBULATORY_CARE_PROVIDER_SITE_OTHER): Payer: 59 | Admitting: Family Medicine

## 2019-10-09 ENCOUNTER — Ambulatory Visit: Payer: 59 | Admitting: Family Medicine

## 2019-10-09 ENCOUNTER — Ambulatory Visit (INDEPENDENT_AMBULATORY_CARE_PROVIDER_SITE_OTHER): Payer: 59 | Admitting: Internal Medicine

## 2019-10-09 ENCOUNTER — Other Ambulatory Visit: Payer: Self-pay

## 2019-10-09 ENCOUNTER — Ambulatory Visit: Payer: Self-pay

## 2019-10-09 ENCOUNTER — Encounter: Payer: Self-pay | Admitting: Family Medicine

## 2019-10-09 VITALS — BP 131/84 | HR 93 | Ht 63.0 in | Wt 360.4 lb

## 2019-10-09 DIAGNOSIS — I10 Essential (primary) hypertension: Secondary | ICD-10-CM | POA: Diagnosis not present

## 2019-10-09 DIAGNOSIS — M1712 Unilateral primary osteoarthritis, left knee: Secondary | ICD-10-CM | POA: Diagnosis not present

## 2019-10-09 DIAGNOSIS — M1711 Unilateral primary osteoarthritis, right knee: Secondary | ICD-10-CM | POA: Diagnosis not present

## 2019-10-09 DIAGNOSIS — Z0000 Encounter for general adult medical examination without abnormal findings: Secondary | ICD-10-CM

## 2019-10-09 LAB — LIPID PANEL
Chol/HDL Ratio: 4.1 ratio (ref 0.0–4.4)
Cholesterol, Total: 168 mg/dL (ref 100–199)
HDL: 41 mg/dL (ref >39)
LDL Chol Calc (NIH): 115 mg/dL — ABNORMAL HIGH (ref 0–99)
Triglycerides: 64 mg/dL (ref 0–149)
VLDL Cholesterol Cal: 12 mg/dL (ref 5–40)

## 2019-10-09 NOTE — Patient Instructions (Signed)
Medication Instructions:  The current medical regimen is effective;  continue present plan and medications.  *If you need a refill on your cardiac medications before your next appointment, please call your pharmacy*   Lab Work: Please have lipid panel.  If you have labs (blood work) drawn today and your tests are completely normal, you will receive your results only by: Marland Kitchen MyChart Message (if you have MyChart) OR . A paper copy in the mail If you have any lab test that is abnormal or we need to change your treatment, we will call you to review the results.  Follow-Up: At [facility], you and your health needs are our priority.  As part of our continuing mission to provide you with exceptional heart care, we have created designated Provider Care Teams.  These Care Teams include your primary Cardiologist (physician) and Advanced Practice Providers (APPs -  Physician Assistants and Nurse Practitioners) who all work together to provide you with the care you need, when you need it.  We recommend signing up for the patient portal called "MyChart".  Sign up information is provided on this After Visit Summary.  MyChart is used to connect with patients for Virtual Visits (Telemedicine).  Patients are able to view lab/test results, encounter notes, upcoming appointments, etc.  Non-urgent messages can be sent to your provider as well.   To learn more about what you can do with MyChart, go to [email or web address].    Your next appointment:   5 month(s)  The format for your next appointment:   In Person  Provider:   Dorris Carnes, MD   Thank you for choosing [facility]!!

## 2019-10-09 NOTE — Progress Notes (Signed)
Office Visit Note   Patient: Karen [name]           Date of Birth: 07/03/1970           MRN: [id number] Visit Date: 10/09/2019 Requested by: Nicolette Bang, DO [address] [place],  [place] [address] PCP: Nicolette Bang, DO  Subjective: Chief Complaint  Patient presents with  . Right Knee - Pain    Had no relief from the Durolane injections in January of this year. Requesting cortisone injections today. But, she will be having bariatric surgery on 10/23/19 and she wants to know if the cortisone will interfere with anesthesia.  . Left Knee - Pain    HPI: She is here with bilateral knee pain.  Durolane injections didn't help at all.  Cortisone works better for her and she would like to have that again, but she is having bariatric surgery on July 16.  She wants to be sure that it will affect her surgical outcome.                ROS:   All other systems were reviewed and are negative.  Objective: Vital Signs: There were no vitals taken for this visit.  Physical Exam:  General:  Alert and oriented, in no acute distress. Pulm:  Breathing unlabored. Psy:  Normal mood, congruent affect. Skin: No erythema or warmth Knees: She has trace effusion bilaterally.  Imaging: No results found.  Assessment & Plan: 1.  Bilateral knee osteoarthritis -I told her that I am concerned about possibility that steroid injections could potentially delay wound healing and increased risk of infection.  Elected to try glucosamine and turmeric instead, and after her surgery if she is still having a lot of pain we could inject with cortisone at that point.     Procedures: No procedures performed  No notes on file     PMFS History: Patient Active Problem List   Diagnosis Date Noted  . Depression 07/29/2019  . Chronic anticoagulation 07/29/2019  . Nocturnal dyspnea 07/29/2019  . History of 2019 novel coronavirus disease (COVID-19) 07/29/2019  . Right leg pain  07/29/2019  . Brain fog 07/29/2019  . Acute pulmonary embolism ([place]) 06/26/2019  . Incidental lung nodule 06/26/2019  . Steatosis of liver 06/25/2018  . Carpal tunnel syndrome of right wrist 02/10/2018  . Essential hypertension 10/26/2016  . Menorrhagia 02/22/2015  . Morbid obesity ([place]) 11/10/2012   Past Medical History:  Diagnosis Date  . Acute pulmonary embolism ([place]) 06/26/2019  . Arthritis    Left knee  . Carpal tunnel syndrome of right wrist 02/10/2018  . Essential hypertension 10/26/2016  . Family history of early CAD   . Gallstone 06/25/2018  . Hypertension   . Incidental lung nodule 06/26/2019  . Menorrhagia 02/22/2015  . Morbid obesity ([place]) 11/10/2012   BMI 63   . Pain in right hand 01/27/2018  . PONV (postoperative nausea and vomiting)   . Right upper quadrant pain 06/25/2018  . Steatosis of liver 06/25/2018    Family History  Problem Relation Age of Onset  . Hyperlipidemia Father   . Heart attack Father   . Heart attack Other   . Hypertension Other   . Cancer Other   . Stroke Other   . Hypertension Mother   . Parkinson's disease Mother     Past Surgical History:  Procedure Laterality Date  . CARPAL TUNNEL RELEASE Right 04/04/2018   Procedure: CARPAL TUNNEL RELEASE;  Surgeon: Latanya Maudlin, MD;  Location: [facility];  Service: Orthopedics;  Laterality: Right;  49min  . CHOLECYSTECTOMY    . gastic sleeve  2015  . HYSTEROSCOPY WITH NOVASURE N/A 03/29/2015   Procedure: HYSTEROSCOPY WITH NOVASURE;  Surgeon: Anastasio Auerbach, MD;  Location: [facility];  Service: Gynecology;  Laterality: N/A;  to follow first case around 10:15am.  Requests one hour OR time.  . INTRAUTERINE DEVICE INSERTION     X 2  Spontaneously extruded X 2.   2014/2015  . TUBAL LIGATION  approximately 2000   as an interval procedure historically   Social History   Occupational History  . Not on file  Tobacco Use  . Smoking status: Former Research scientist (life sciences)  . Smokeless tobacco: Never Used  Vaping Use  .  Vaping Use: Never used  Substance and Sexual Activity  . Alcohol use: Yes    Comment: occas   . Drug use: No  . Sexual activity: Yes    Birth control/protection: Surgical    Comment: BTL

## 2019-10-09 NOTE — Patient Instructions (Signed)
   Glucosamine Sulfate:  1,000 mg twice daily  Turmeric:  500 mg twice daily   

## 2019-10-16 ENCOUNTER — Telehealth: Payer: Self-pay | Admitting: Internal Medicine

## 2019-10-16 ENCOUNTER — Telehealth: Payer: Self-pay | Admitting: *Deleted

## 2019-10-16 ENCOUNTER — Other Ambulatory Visit: Payer: Self-pay

## 2019-10-16 ENCOUNTER — Telehealth: Payer: Self-pay | Admitting: Critical Care Medicine

## 2019-10-16 ENCOUNTER — Ambulatory Visit (HOSPITAL_BASED_OUTPATIENT_CLINIC_OR_DEPARTMENT_OTHER): Payer: 59 | Attending: Critical Care Medicine | Admitting: Internal Medicine

## 2019-10-16 ENCOUNTER — Telehealth: Payer: Self-pay

## 2019-10-16 DIAGNOSIS — E785 Hyperlipidemia, unspecified: Secondary | ICD-10-CM

## 2019-10-16 DIAGNOSIS — R0600 Dyspnea, unspecified: Secondary | ICD-10-CM | POA: Diagnosis present

## 2019-10-16 MED ORDER — ROSUVASTATIN CALCIUM 10 MG PO TABS: 10.0000 mg | ORAL_TABLET | Freq: Every day | ORAL | 3 refills | Status: DC

## 2019-10-16 NOTE — Telephone Encounter (Signed)
Left message to call office

## 2019-10-16 NOTE — Telephone Encounter (Signed)
Follow up   Pt is returning call    

## 2019-10-16 NOTE — Telephone Encounter (Signed)
Error

## 2019-10-16 NOTE — Telephone Encounter (Signed)
Please see prior note. Please advise pt.

## 2019-10-16 NOTE — Telephone Encounter (Signed)
I spoke with patient and reviewed lab results and recommendations from Dr Harrington Challenger with her. Will send prescription for Crestor to [facility] on [address]. Patient will come in for fasting lab work on 12/11/19 Patient reports she is scheduled for bariatric surgery on 7/16. States Dr Harrington Challenger was going to check on Eliquis related to  this but patient reports she has not heard anything. Per office note Dr Harrington Challenger was forwarding to pulmonary regarding discontinuing Eliquis.   Will review with Dr Harrington Challenger

## 2019-10-16 NOTE — Telephone Encounter (Signed)
-----   Message from Fay Records, MD sent at 10/13/2019 12:18 AM EDT ----- LDL is 115   Given that CT scan of chest shows mild coronary plaquing this should be lower    I would recomm Crestor 10 mg daily    F/U lipids in 8 wks with AST

## 2019-10-16 NOTE — Telephone Encounter (Signed)
Called [facility] in regards to fax received for medical clearance. Per Dr. Juleen China, patient saw Cardiology on 10/09/2019 who stated that patient should follow up with Pulmonology about Eliquis. Dr. Juleen China agrees with that recommendation. Pre-Admission team notified.

## 2019-10-16 NOTE — Telephone Encounter (Signed)
Patient calling stating she read through her preop paper work and it states to take her eliquis like normal. She would like clarification on whether she is okay to stop the medication for her surgery or if she can continue it. She spoke with Dr. Harrington Challenger earlier today see note 10/16/2019.

## 2019-10-16 NOTE — Telephone Encounter (Signed)
I spoke with pt today   Note plans for surgery at [facility] note by Tia Masker said 12 months of anticoagulation   She is now about 4 months in. Will forward msg to him so he is aware, to review if any changes  Keep on current meds

## 2019-10-16 NOTE — Telephone Encounter (Signed)
Please advise. Patient will have surgery 10/23/2019.

## 2019-10-16 NOTE — Telephone Encounter (Signed)
Continued...  Patient requested for a fu if she should discontinue Eliquis.

## 2019-10-16 NOTE — Telephone Encounter (Signed)
Called pt unable to reach/ Left voice message to call back. Name and phone nr provided.

## 2019-10-16 NOTE — Telephone Encounter (Signed)
Patient requesting to speak with Dr. Bettina Gavia CMA regarding coming off of Elequis prior to bariatric surgery.

## 2019-10-19 ENCOUNTER — Telehealth: Payer: Self-pay

## 2019-10-19 NOTE — Telephone Encounter (Signed)
Pt request refax medical clearance. Dr. Gayla Medicus office told pt they did not receive the fax & they canceled her 1:oo pm appt today.

## 2019-10-19 NOTE — Telephone Encounter (Signed)
I called the patient this morning.   I told her she can proceed with planned bariatric surgery and can hold eliquis at the surgeons preference 1-3 days preop.   Also she can resume when hemostasis is achieved post op.  Her Pulmonary emboli were small and her venous doppler u/s was neg in April for DVT.  I called the surgeons office and told the receptionist this medical clearance will be faxed stat as the pt has a telemedicine appt with the surgeon at 1pm today  Dr Asencion Noble

## 2019-10-19 NOTE — Telephone Encounter (Signed)
Spoke with the patient / stated Dr Darnell Level received required paperwork / No further action needed

## 2019-10-19 NOTE — Telephone Encounter (Signed)
   Called patient back regarding questions about eliquis. Left a detailed voicemail requesting the patient reach out to her primary care provider for input on holding eliquis prior to her upcoming surgery. Instructed patient to call back if she has further questions.   Abigail Butts, PA-C 10/19/19 2:26 PM

## 2019-10-19 NOTE — Telephone Encounter (Signed)
As instructed med clearance was faxed to MD Kreg Shropshire office/ confirmation received

## 2019-10-23 ENCOUNTER — Other Ambulatory Visit: Payer: Self-pay | Admitting: Critical Care Medicine

## 2019-10-25 DIAGNOSIS — R0600 Dyspnea, unspecified: Secondary | ICD-10-CM | POA: Diagnosis not present

## 2019-10-25 NOTE — Procedures (Signed)
   Patient Name: Karen [name], Karen [name] Study Date: 10/17/2019 Gender: Female D.O.B: 1970/08/27 Age (years): 32 Referring Provider: Asencion Noble Height (inches): 48 Interpreting Physician: Baird Lyons MD, ABSM Weight (lbs): 353 RPSGT: Jacolyn Reedy BMI: 63 MRN: [id number] Neck Size: 17.00  CLINICAL INFORMATION Sleep Study Type: HST Indication for sleep study: OSA Epworth Sleepiness Score: 8  SLEEP STUDY TECHNIQUE A multi-channel overnight portable sleep study was performed. The channels recorded were: nasal airflow, thoracic respiratory movement, and oxygen saturation with a pulse oximetry. Snoring was also monitored.  MEDICATIONS Patient self administered medications include: none reported.  SLEEP ARCHITECTURE Patient was studied for 583.1 minutes. The sleep efficiency was 100.0 % and the patient was supine for 0%. The arousal index was 0.0 per hour.  RESPIRATORY PARAMETERS The overall AHI was 7.2 per hour, with a central apnea index of 0.0 per hour. The oxygen nadir was 73% during sleep.  CARDIAC DATA Mean heart rate during sleep was 78.2 bpm.  IMPRESSIONS - Mild obstructive sleep apnea occurred during this study (AHI = 7.2/h). - No significant central sleep apnea occurred during this study (CAI = 0.0/h). - Oxygen desaturation was noted during this study (Min O2 = 73%). Mean sat 91%. - Time with O2 saturation 89% or less was 44.9 minutes. - Patient snored.  DIAGNOSIS - Obstructive Sleep Apnea (G47.33) - Nocturnal Hypoxemia (G47.36)  RECOMMENDATIONS - Suggest CPAP titration sleep study because of the nocturnal hypoxemia. An attended study would be required for documentation if supplemental O2 is needed in addition to CPAP.  - Be careful with alcohol, sedatives and other CNS depressants that may worsen sleep apnea and disrupt normal sleep architecture. - Sleep hygiene should be reviewed to assess factors that may improve sleep quality. - Weight management and  regular exercise should be initiated or continued.  [Electronically signed] 10/25/2019 09:17 AM  Baird Lyons MD, ABSM Diplomate, American Board of Sleep Medicine   NPI: [id number]                          [place]  ELECTRONICALLY SIGNED ON:  10/25/2019, 9:12 AM [facility] PH: [phone]   FX: [phone] Will

## 2019-10-26 ENCOUNTER — Telehealth: Payer: Self-pay

## 2019-10-26 ENCOUNTER — Telehealth: Payer: Self-pay | Admitting: Critical Care Medicine

## 2019-10-26 DIAGNOSIS — G4733 Obstructive sleep apnea (adult) (pediatric): Secondary | ICD-10-CM | POA: Insufficient documentation

## 2019-10-26 DIAGNOSIS — G4736 Sleep related hypoventilation in conditions classified elsewhere: Secondary | ICD-10-CM | POA: Insufficient documentation

## 2019-10-26 DIAGNOSIS — E669 Obesity, unspecified: Secondary | ICD-10-CM | POA: Insufficient documentation

## 2019-10-26 NOTE — Telephone Encounter (Signed)
Call placed to patient regarding order for CPAP.  She did not have a preference for DME companies. Explained to her that the referral can be sent to [facility]. If they are not in network with her insurance, this CM  will try another DME company and she was in agreement.   Order for CPAP faxed to [facility],.

## 2019-10-26 NOTE — Telephone Encounter (Signed)
Pt had sleep study that showed mild apnea and nocturnal hypoxemia.  Will order Cpap and f/u.

## 2019-10-27 ENCOUNTER — Telehealth: Payer: Self-pay

## 2019-10-27 NOTE — Telephone Encounter (Signed)
Call placed to [facility], spoke to [place] who confirmed receipt of the CPAP order.  She also confirmed that [facility] is in network with patient's insurance, [employer]. She said that they send the patient a text message informing her that they received the referral and are verifying insurance coverage.  They will contact her again when the coverage information has been received.

## 2019-11-10 ENCOUNTER — Ambulatory Visit (INDEPENDENT_AMBULATORY_CARE_PROVIDER_SITE_OTHER): Payer: 59 | Admitting: Family Medicine

## 2019-11-10 ENCOUNTER — Ambulatory Visit: Payer: Self-pay

## 2019-11-10 ENCOUNTER — Encounter: Payer: Self-pay | Admitting: Family Medicine

## 2019-11-10 ENCOUNTER — Other Ambulatory Visit: Payer: Self-pay

## 2019-11-10 DIAGNOSIS — M1711 Unilateral primary osteoarthritis, right knee: Secondary | ICD-10-CM

## 2019-11-10 DIAGNOSIS — M1712 Unilateral primary osteoarthritis, left knee: Secondary | ICD-10-CM

## 2019-11-10 MED ORDER — ACETAMINOPHEN-CODEINE #3 300-30 MG PO TABS: 1.0000 | ORAL_TABLET | Freq: Four times a day (QID) | ORAL | 0 refills | Status: DC | PRN

## 2019-11-10 NOTE — Progress Notes (Signed)
Office Visit Note   Patient: Karen [name]           Date of Birth: Oct 29, 1970           MRN: [id number] Visit Date: 11/10/2019 Requested by: Nicolette Bang, DO [place],  [place] [address] PCP: Nicolette Bang, DO  Subjective: Chief Complaint  Patient presents with  . Right Knee - Pain    Requests bilateral knee cortisone injections, now that she is over 3 weeks out from her bariatric surgery.  . Left Knee - Pain    HPI: She is here with bilateral knee pain.  She had her bariatric surgery 3 weeks ago and is already lost more than 30 pounds.  She is doing well in her recovery.  Her knees are hurting quite a bit she would like to have injections.              ROS:   All other systems were reviewed and are negative.  Objective: Vital Signs: There were no vitals taken for this visit.  Physical Exam:  General:  Alert and oriented, in no acute distress. Pulm:  Breathing unlabored. Psy:  Normal mood, congruent affect.  Knees: No definite effusion palpable.  Imaging: US Guided Needle Placement - No Linked Charges  Result Date: 11/10/2019 Ultrasound-guided bilateral knee injections: After sterile prep with Betadine, injected 5 cc 1% lidocaine without epinephrine and 40 mg methylprednisolone from medial suprapatellar approach on the right and medial midpatellar approach on the left using ultrasound to guide needle placement into the joint recesses.   Assessment & Plan: 1.  Bilateral knee osteoarthritis -Ultrasound-guided injections as above.  Very difficult to inject by palpation guidance.  Follow-up as needed.     Procedures: No procedures performed  No notes on file     PMFS History: Patient Active Problem List   Diagnosis Date Noted  . OSA (obstructive sleep apnea) 10/26/2019  . Nocturnal hypoxemia due to obesity 10/26/2019  . Depression 07/29/2019  . Chronic anticoagulation 07/29/2019  . Nocturnal dyspnea 07/29/2019  .  History of 2019 novel coronavirus disease (COVID-19) 07/29/2019  . Right leg pain 07/29/2019  . Brain fog 07/29/2019  . Acute pulmonary embolism ([place]) 06/26/2019  . Incidental lung nodule 06/26/2019  . Steatosis of liver 06/25/2018  . Carpal tunnel syndrome of right wrist 02/10/2018  . Essential hypertension 10/26/2016  . Menorrhagia 02/22/2015  . Morbid obesity ([place]) 11/10/2012   Past Medical History:  Diagnosis Date  . Acute pulmonary embolism ([place]) 06/26/2019  . Arthritis    Left knee  . Carpal tunnel syndrome of right wrist 02/10/2018  . Essential hypertension 10/26/2016  . Family history of early CAD   . Gallstone 06/25/2018  . Hypertension   . Incidental lung nodule 06/26/2019  . Menorrhagia 02/22/2015  . Morbid obesity ([place]) 11/10/2012   BMI 63   . Pain in right hand 01/27/2018  . PONV (postoperative nausea and vomiting)   . Right upper quadrant pain 06/25/2018  . Steatosis of liver 06/25/2018    Family History  Problem Relation Age of Onset  . Hyperlipidemia Father   . Heart attack Father   . Heart attack Other   . Hypertension Other   . Cancer Other   . Stroke Other   . Hypertension Mother   . Parkinson's disease Mother     Past Surgical History:  Procedure Laterality Date  . CARPAL TUNNEL RELEASE Right 04/04/2018   Procedure: CARPAL TUNNEL RELEASE;  Surgeon:  Latanya Maudlin, MD;  Location: [facility];  Service: Orthopedics;  Laterality: Right;  60min  . CHOLECYSTECTOMY    . gastic sleeve  2015  . HYSTEROSCOPY WITH NOVASURE N/A 03/29/2015   Procedure: HYSTEROSCOPY WITH NOVASURE;  Surgeon: Anastasio Auerbach, MD;  Location: [facility];  Service: Gynecology;  Laterality: N/A;  to follow first case around 10:15am.  Requests one hour OR time.  . INTRAUTERINE DEVICE INSERTION     X 2  Spontaneously extruded X 2.   2014/2015  . TUBAL LIGATION  approximately 2000   as an interval procedure historically   Social History   Occupational History  . Not on file  Tobacco Use    . Smoking status: Former Research scientist (life sciences)  . Smokeless tobacco: Never Used  Vaping Use  . Vaping Use: Never used  Substance and Sexual Activity  . Alcohol use: Yes    Comment: occas   . Drug use: No  . Sexual activity: Yes    Birth control/protection: Surgical    Comment: BTL

## 2019-11-11 ENCOUNTER — Telehealth: Payer: Self-pay | Admitting: Family Medicine

## 2019-11-11 MED ORDER — ACETAMINOPHEN-CODEINE 120-12 MG/5ML PO SOLN: 5.0000 mL | Freq: Four times a day (QID) | ORAL | 0 refills | Status: DC | PRN

## 2019-11-11 NOTE — Telephone Encounter (Signed)
Rx sent 

## 2019-11-11 NOTE — Telephone Encounter (Signed)
I called and advised the patient. 

## 2019-11-11 NOTE — Telephone Encounter (Signed)
Patient called requesting hydrocodone and acetaminophen in chewable or liquid form. Patient is unable to take pills. Please send to pharmacy on file. Please call patient at [phone]

## 2019-11-16 ENCOUNTER — Telehealth: Payer: Self-pay | Admitting: Critical Care Medicine

## 2019-11-16 ENCOUNTER — Telehealth: Payer: Self-pay

## 2019-11-16 NOTE — Telephone Encounter (Signed)
Call placed to patient # [phone] regarding her CPAP order.   Message left with call back requested to this CM # [phone].  She needs to call [facility] # [phone]  to arrange CPAP pick up.  They have not been able to reach her after multiple attempts.

## 2019-11-16 NOTE — Telephone Encounter (Signed)
This pt has not answered [facility] call x 3 for cpap set up  Can you reach out to her ?  I tried to call this am, no answer

## 2019-11-17 NOTE — Telephone Encounter (Signed)
Attempted to call patient again  # [phone] regarding her CPAP order.   Message left with call back requested to this CM # [phone].  She needs to call [facility] # [phone]  to arrange CPAP pick up.  They have not been able to reach her after multiple attempts.

## 2019-11-18 NOTE — Telephone Encounter (Signed)
Letter sent to patient requesting she call this office to discuss the CPAP order.

## 2019-11-30 ENCOUNTER — Telehealth: Payer: Self-pay | Admitting: Internal Medicine

## 2019-11-30 NOTE — Telephone Encounter (Signed)
Stopped in the office to drop off [facility] Provider Statement, 4 forms) for pcp to fill out.

## 2019-12-01 ENCOUNTER — Ambulatory Visit (INDEPENDENT_AMBULATORY_CARE_PROVIDER_SITE_OTHER): Payer: 59 | Admitting: Internal Medicine

## 2019-12-01 DIAGNOSIS — G4733 Obstructive sleep apnea (adult) (pediatric): Secondary | ICD-10-CM

## 2019-12-01 DIAGNOSIS — Z0289 Encounter for other administrative examinations: Secondary | ICD-10-CM

## 2019-12-01 DIAGNOSIS — Z8616 Personal history of COVID-19: Secondary | ICD-10-CM

## 2019-12-01 DIAGNOSIS — I2699 Other pulmonary embolism without acute cor pulmonale: Secondary | ICD-10-CM

## 2019-12-01 NOTE — Progress Notes (Signed)
Virtual Visit via Telephone Note  I connected with Karen [name], on 12/01/2019 at 10:02 AM by telephone due to the COVID-19 pandemic and verified that I am speaking with the correct person using two identifiers.   Consent: I discussed the limitations, risks, security and privacy concerns of performing an evaluation and management service by telephone and the availability of in person appointments. I also discussed with the patient that there may be a patient responsible charge related to this service. The patient expressed understanding and agreed to proceed.   Location of Patient: Home   Location of Provider: Clinic    Persons participating in Telemedicine visit: Dania J Marla Roe [facility] Dr. Juleen China      History of Present Illness: Patient has a visit for FMLA paperwork related to COVID-19 with provoked PE. 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 She has been out of work since 03/09/20 after being diagnosed with COVID on Nov 30. Completed FMLA paperwork on 6/17 writing patient out of work through 12/15/19. She has follow up visit today. Since we saw each other last, she had sleep study that showed OSA and was prescribed CPAP machine. She still is awaiting delivery of her supplies.   She still has SOB, voice hoarseness, loss of voice, migraine headaches. Performing daily tasks has been limited due to fatigue. She has been followed by respiratory/COVID clinic, neurology, cardiology, and pulmonology for this. She is on Eliquis for COVID provoked PE.   Has been referred to PT by [facility]. She is recovering from bariatric surgery and will plan to have PT started once she is out of the acute recovery process.    Past Medical History:  Diagnosis Date  . Acute pulmonary embolism ([facility]) 06/26/2019  . Arthritis    Left knee  . Carpal tunnel syndrome of right wrist 02/10/2018  . Essential hypertension 10/26/2016  . Family history of early CAD   .  Gallstone 06/25/2018  . Hypertension   . Incidental lung nodule 06/26/2019  . Menorrhagia 02/22/2015  . Morbid obesity ([facility]) 11/10/2012   BMI 63   . Pain in right hand 01/27/2018  . PONV (postoperative nausea and vomiting)   . Right upper quadrant pain 06/25/2018  . Steatosis of liver 06/25/2018   Allergies  Allergen Reactions  . Nsaids     History of gastric sleeve  . Other Other (See Comments)    Patient stated that she can only take chewable and liquid medication Patient says she can now swallow solid pills/capsules/caplets 06/25/2018    Current Outpatient Medications on File Prior to Visit  Medication Sig Dispense Refill  . acetaminophen-codeine 120-12 MG/5ML solution Take 5-10 mLs by mouth every 6 (six) hours as needed for moderate pain. 240 mL 0  . albuterol (VENTOLIN HFA) 108 (90 Base) MCG/ACT inhaler TAKE 2 PUFFS BY MOUTH EVERY 6 HOURS AS NEEDED FOR WHEEZE OR SHORTNESS OF BREATH 18 g 1  . amLODipine (NORVASC) 10 MG tablet Take 1 tablet (10 mg total) by mouth daily. (Patient not taking: Reported on 11/10/2019) 90 tablet 0  . apixaban (ELIQUIS) 5 MG TABS tablet Take 1 tablet (5 mg total) by mouth 2 (two) times daily. 180 tablet 0  . b complex vitamins tablet Take 1 tablet by mouth daily.    . butalbital-acetaminophen-caffeine (FIORICET) 50-325-40 MG tablet Take 50-235 tablets by mouth as needed.    . Calcium Carbonate-Vitamin D (CALCIUM-VITAMIN D) 600-125 MG-UNIT TABS Take 1 tablet by mouth daily.    Marland Kitchen  furosemide (LASIX) 40 MG tablet Take 1 tablet (40 mg total) by mouth every Monday. 30 tablet 3  . pantoprazole (PROTONIX) 40 MG tablet TAKE 1 TABLET BY MOUTH EVERY DAY 90 tablet 2  . traMADol (ULTRAM) 50 MG tablet Take 50 mg by mouth every 6 (six) hours as needed.     No current facility-administered medications on file prior to visit.    Observations/Objective: NAD. Speaking clearly.  Work of breathing normal.  Alert and oriented. Mood appropriate.   Assessment and Plan: 1.  History of 2019 novel coronavirus disease (COVID-19) Patient is still symptomatic and daily tasks are very limited due to her symptomatic state. She is unable to perform her job duties at present. Plan for PT referral.   2. Acute pulmonary embolism without acute cor pulmonale, unspecified pulmonary embolism type (HCC) Continue Eliquis. Plan for one year of total anticoagulation.   3. Encounter for completion of form with patient FMLA paperwork was completed.   4. OSA (obstructive sleep apnea) New diagnosis. Awaiting CPAP delivery.    Follow Up Instructions: PRN and for routine medical care    I discussed the assessment and treatment plan with the patient. The patient was provided an opportunity to ask questions and all were answered. The patient agreed with the plan and demonstrated an understanding of the instructions.   The patient was advised to call back or seek an in-person evaluation if the symptoms worsen or if the condition fails to improve as anticipated.     I provided 18 minutes total of non-face-to-face time during this encounter including median intraservice time, reviewing previous notes, investigations, ordering medications, medical decision making, coordinating care and patient verbalized understanding at the end of the visit.    Phill Myron, D.O. Primary Care at [facility]  12/01/2019, 10:02 AM

## 2019-12-11 ENCOUNTER — Other Ambulatory Visit: Payer: 59

## 2019-12-16 ENCOUNTER — Other Ambulatory Visit: Payer: Self-pay

## 2019-12-16 DIAGNOSIS — I2699 Other pulmonary embolism without acute cor pulmonale: Secondary | ICD-10-CM

## 2019-12-16 DIAGNOSIS — I10 Essential (primary) hypertension: Secondary | ICD-10-CM

## 2019-12-16 MED ORDER — APIXABAN 5 MG PO TABS: 5.0000 mg | ORAL_TABLET | Freq: Two times a day (BID) | ORAL | 0 refills | Status: DC

## 2019-12-16 MED ORDER — AMLODIPINE BESYLATE 10 MG PO TABS: 10.0000 mg | ORAL_TABLET | Freq: Every day | ORAL | 0 refills | Status: DC

## 2019-12-17 ENCOUNTER — Other Ambulatory Visit: Payer: Self-pay | Admitting: Neurology

## 2019-12-21 NOTE — Telephone Encounter (Signed)
Patient called saying that information was sent over to her employer and patient states she does not know what kind of information was sent. Please f/u.

## 2019-12-22 NOTE — Telephone Encounter (Signed)
Called pt and made her aware of the paperwork with more information on why she is out

## 2019-12-22 NOTE — Telephone Encounter (Signed)
Please follow up.  Karen [name] has not completed any paperwork for patient in a few weeks.

## 2019-12-29 ENCOUNTER — Ambulatory Visit: Payer: 59

## 2020-01-13 ENCOUNTER — Ambulatory Visit: Payer: 59

## 2020-01-19 ENCOUNTER — Ambulatory Visit: Payer: 59

## 2020-01-22 ENCOUNTER — Ambulatory Visit: Payer: 59 | Admitting: Physical Therapy

## 2020-02-12 ENCOUNTER — Encounter: Payer: Self-pay | Admitting: Internal Medicine

## 2020-02-12 ENCOUNTER — Ambulatory Visit (INDEPENDENT_AMBULATORY_CARE_PROVIDER_SITE_OTHER): Payer: 59 | Admitting: Internal Medicine

## 2020-02-12 ENCOUNTER — Other Ambulatory Visit: Payer: Self-pay

## 2020-02-12 VITALS — BP 121/68 | HR 113 | Temp 97.9°F | Resp 18 | Wt 308.0 lb

## 2020-02-12 DIAGNOSIS — Z8616 Personal history of COVID-19: Secondary | ICD-10-CM | POA: Diagnosis not present

## 2020-02-12 DIAGNOSIS — Z23 Encounter for immunization: Secondary | ICD-10-CM

## 2020-02-12 DIAGNOSIS — Z7689 Persons encountering health services in other specified circumstances: Secondary | ICD-10-CM

## 2020-02-12 NOTE — Progress Notes (Signed)
Subjective:    Karen [name] - 49 y.o. female MRN [id number]  Date of birth: October 03, 1970  HPI  Karen [name] is here for RTW evaluation. Unfortunately, patient was diagnosed with COVID-19 virus on 03/09/19 and has suffered complications and persistent symptoms since. Post diagnosis course has been complicated by acute pulmonary embolus, on Eliquis. She has been followed by covid care clinic for variety of concerns including persistent SOB, voice hoarseness/loss of voice, etc. Has been referred to neurology for migraines. Has been referred to cardiology for tachycardia. Works for call center for [employer]. Has asked to return to work 11/10 with restrictions.     Health Maintenance:  Health Maintenance Due  Topic Date Due  . Fecal DNA (Cologuard)  Never done  . PAP SMEAR-Modifier  02/22/2020    -  reports that she has quit smoking. She has never used smokeless tobacco. - Review of Systems: Per HPI. - Past Medical History: Patient Active Problem List   Diagnosis Date Noted  . OSA (obstructive sleep apnea) 10/26/2019  . Nocturnal hypoxemia due to obesity 10/26/2019  . Depression 07/29/2019  . Chronic anticoagulation 07/29/2019  . Nocturnal dyspnea 07/29/2019  . History of 2019 novel coronavirus disease (COVID-19) 07/29/2019  . Right leg pain 07/29/2019  . Brain fog 07/29/2019  . Acute pulmonary embolism ([place]) 06/26/2019  . Incidental lung nodule 06/26/2019  . Steatosis of liver 06/25/2018  . Carpal tunnel syndrome of right wrist 02/10/2018  . Essential hypertension 10/26/2016  . Menorrhagia 02/22/2015  . Morbid obesity ([place]) 11/10/2012   - Medications: reviewed and updated   Objective:   Physical Exam BP 121/68   Pulse (!) 113   Temp 97.9 F (36.6 C) (Temporal)   Resp 18   Wt (!) 308 lb (139.7 kg)   SpO2 95%   BMI 54.56 kg/m  Physical Exam Constitutional:      General: She is not in acute distress.    Appearance: She is not diaphoretic.  HENT:      Head: Normocephalic and atraumatic.  Eyes:     Conjunctiva/sclera: Conjunctivae normal.  Cardiovascular:     Rate and Rhythm: Regular rhythm. Tachycardia present.     Heart sounds: Normal heart sounds. No murmur heard.   Pulmonary:     Effort: Pulmonary effort is normal. No respiratory distress.     Breath sounds: Normal breath sounds.     Comments: Some dyspnea when speaking in multiple sentences.  Musculoskeletal:        General: Normal range of motion.  Skin:    General: Skin is warm and dry.  Neurological:     Mental Status: She is alert and oriented to person, place, and time.  Psychiatric:        Mood and Affect: Affect normal.        Judgment: Judgment normal.            Assessment & Plan:   1. Return to work evaluation Will provide FMLA paperwork for patient to return to work with restrictions for 6 months. Will also provide letter asking she be able to work from home due to her co-morbidities and risk of severe disease if she were to [place] again.   2. History of COVID-19 Still mildly symptomatic. We discussed prevention measures. Encouraged patient to receive [place] booster as she is now eligible.   3. Needs flu shot - Flu Vaccine QUAD 6+ mos PF IM (Fluarix Quad PF)     Phill Myron, D.O.  02/12/2020, 11:07 AM Primary Care at [facility]

## 2020-02-23 ENCOUNTER — Other Ambulatory Visit: Payer: Self-pay

## 2020-02-23 ENCOUNTER — Ambulatory Visit (INDEPENDENT_AMBULATORY_CARE_PROVIDER_SITE_OTHER): Payer: 59 | Admitting: Family Medicine

## 2020-02-23 ENCOUNTER — Encounter: Payer: Self-pay | Admitting: Family Medicine

## 2020-02-23 VITALS — BP 154/96 | HR 89 | Temp 97.3°F | Wt 303.0 lb

## 2020-02-23 DIAGNOSIS — L853 Xerosis cutis: Secondary | ICD-10-CM

## 2020-02-23 DIAGNOSIS — R221 Localized swelling, mass and lump, neck: Secondary | ICD-10-CM

## 2020-02-23 DIAGNOSIS — R222 Localized swelling, mass and lump, trunk: Secondary | ICD-10-CM

## 2020-02-23 MED ORDER — TRIAMCINOLONE ACETONIDE 0.5 % EX CREA: 1.0000 "application " | TOPICAL_CREAM | Freq: Two times a day (BID) | CUTANEOUS | 0 refills | Status: DC | PRN

## 2020-02-23 NOTE — Patient Instructions (Signed)

## 2020-02-26 ENCOUNTER — Telehealth: Payer: Self-pay | Admitting: Internal Medicine

## 2020-03-02 NOTE — Progress Notes (Signed)
Patient ID: Karen [name], female    DOB: [birth year], 49 y.o.   MRN: [id number]  PCP: Nicolette Bang, DO  Chief Complaint  Patient presents with  . Mass    on L side of chest. states that it isn't painful when touched. isn't movable    Subjective:  HPI  Karen [name] is a 49 y.o. female presents for evaluation " nodular like" mass below the left neck immediately before left clavicle. Patient palpated the mass a few days ago and noticed that the mass has not resolved. Mass is nonpainful however she is concern of the cause. No recent illness. No sore throat or dysphagia symptoms. Last mammogram 3/21 and benign upper outer lymph node see on dx [place] mammogram of left breast.  TSH normal (low-normal) April 2021. Social History   Socioeconomic History  . Marital status: Married    Spouse name: Not on file  . Number of children: Not on file  . Years of education: Not on file  . Highest education level: Not on file  Occupational History  . Not on file  Tobacco Use  . Smoking status: Former Research scientist (life sciences)  . Smokeless tobacco: Never Used  Vaping Use  . Vaping Use: Never used  Substance and Sexual Activity  . Alcohol use: Yes    Comment: occas   . Drug use: No  . Sexual activity: Yes    Birth control/protection: Surgical    Comment: BTL  Other Topics Concern  . Not on file  Social History Narrative   Right handed   Lives with friend, two story home   Social Determinants of Health   Financial Resource Strain:   . Difficulty of Paying Living Expenses: Not on file  Food Insecurity:   . Worried About Charity fundraiser in the Last Year: Not on file  . Ran Out of Food in the Last Year: Not on file  Transportation Needs:   . Lack of Transportation (Medical): Not on file  . Lack of Transportation (Non-Medical): Not on file  Physical Activity:   . Days of Exercise per Week: Not on file  . Minutes of Exercise per Session: Not on file  Stress:   . Feeling of Stress :  Not on file  Social Connections:   . Frequency of Communication with Friends and Family: Not on file  . Frequency of Social Gatherings with Friends and Family: Not on file  . Attends Religious Services: Not on file  . Active Member of Clubs or Organizations: Not on file  . Attends Archivist Meetings: Not on file  . Marital Status: Not on file  Intimate Partner Violence:   . Fear of Current or Ex-Partner: Not on file  . Emotionally Abused: Not on file  . Physically Abused: Not on file  . Sexually Abused: Not on file    Family History  Problem Relation Age of Onset  . Hyperlipidemia Father   . Heart attack Father   . Heart attack Other   . Hypertension Other   . Cancer Other   . Stroke Other   . Hypertension Mother   . Parkinson's disease Mother    Review of Systems Pertinent negatives listed in HPI  Patient Active Problem List   Diagnosis Date Noted  . OSA (obstructive sleep apnea) 10/26/2019  . Nocturnal hypoxemia due to obesity 10/26/2019  . Depression 07/29/2019  . Chronic anticoagulation 07/29/2019  . Nocturnal dyspnea 07/29/2019  . History of 2019 novel coronavirus disease (  COVID-19) 07/29/2019  . Right leg pain 07/29/2019  . Brain fog 07/29/2019  . Acute pulmonary embolism ([place]) 06/26/2019  . Incidental lung nodule 06/26/2019  . Steatosis of liver 06/25/2018  . Carpal tunnel syndrome of right wrist 02/10/2018  . Essential hypertension 10/26/2016  . Menorrhagia 02/22/2015  . Morbid obesity ([place]) 11/10/2012    Allergies  Allergen Reactions  . Nsaids     History of gastric sleeve  . Other Other (See Comments)    Patient stated that she can only take chewable and liquid medication Patient says she can now swallow solid pills/capsules/caplets 06/25/2018    Prior to Admission medications   Medication Sig Start Date End Date Taking? Authorizing Provider  albuterol (VENTOLIN HFA) 108 (90 Base) MCG/ACT inhaler TAKE 2 PUFFS BY MOUTH EVERY 6 HOURS AS  NEEDED FOR WHEEZE OR SHORTNESS OF BREATH 07/20/19  Yes Nicolette Bang, DO  apixaban (ELIQUIS) 5 MG TABS tablet Take 1 tablet (5 mg total) by mouth 2 (two) times daily. 12/16/19  Yes Nicolette Bang, DO  butalbital-acetaminophen-caffeine (FIORICET) [phone] MG tablet Take 50-235 tablets by mouth as needed. 06/23/19  Yes [provider]  Calcium Carbonate-Vitamin D (CALCIUM-VITAMIN D) 600-125 MG-UNIT TABS Take 1 tablet by mouth daily.   Yes [provider]  CREON 36000-114000 units CPEP capsule Take 1 capsule by mouth 3 (three) times daily with meals. 01/13/20  Yes [provider]  pantoprazole (PROTONIX) 40 MG tablet TAKE 1 TABLET BY MOUTH EVERY DAY 10/23/19  Yes Nicolette Bang, DO  amLODipine (NORVASC) 10 MG tablet Take 1 tablet (10 mg total) by mouth daily. Patient not taking: Reported on 02/23/2020 12/16/19   Nicolette Bang, DO  triamcinolone cream (KENALOG) 0.5 % Apply 1 application topically 2 (two) times daily as needed. 02/23/20   Scot Jun, FNP    Past Medical, Surgical Family and Social History reviewed and updated.    Objective:   Today's Vitals   02/23/20 1437  BP: (!) 154/96  Pulse: 89  Temp: (!) 97.3 F (36.3 C)  TempSrc: Temporal  SpO2: 96%  Weight: (!) 303 lb (137.4 kg)    Wt Readings from Last 3 Encounters:  02/23/20 (!) 303 lb (137.4 kg)  02/12/20 (!) 308 lb (139.7 kg)  10/09/19 (!) 360 lb 6.4 oz (163.5 kg)     Physical Exam Constitutional:      Appearance: She is obese.  Neck:   Cardiovascular:     Rate and Rhythm: Normal rate and regular rhythm.  Pulmonary:     Effort: Pulmonary effort is normal.     Breath sounds: Normal breath sounds and air entry.  Neurological:     Mental Status: She is alert.  Psychiatric:        Attention and Perception: Attention and perception normal.     No results found for: POCGLU  Lab Results  Component Value Date   HGBA1C 7.6 (H) 04/13/2013              Assessment & Plan:  1. Neck mass, left anterior  - US Soft Tissue Head/Neck (NON-THYROID); Future  2. Dry skin dermatitis -Trial triamcinolone cream TIDPRN       -The patient was given clear instructions to go to ER or return to medical center if symptoms do not improve, worsen or new problems develop. The patient verbalized understanding.    Molli Barrows, FNP Primary Care at [facility] [address] [place] [phone]: [phone]

## 2020-03-08 ENCOUNTER — Telehealth: Payer: Self-pay

## 2020-03-08 NOTE — Telephone Encounter (Signed)
STD needs notes from [place] for Nov 1st-10

## 2020-03-11 ENCOUNTER — Telehealth: Payer: Self-pay

## 2020-03-11 NOTE — Telephone Encounter (Signed)
Pt called about [place] that was ordered on Nov 16 please fu with pt stated she hasn't heard from anybody

## 2020-03-12 ENCOUNTER — Other Ambulatory Visit: Payer: Self-pay | Admitting: Internal Medicine

## 2020-03-12 DIAGNOSIS — I2699 Other pulmonary embolism without acute cor pulmonale: Secondary | ICD-10-CM

## 2020-03-13 NOTE — Progress Notes (Deleted)
Cardiology Office Note   Date:  03/13/2020   ID:  Karen [name], DOB 07-16-1970, MRN [id number]  PCP:  Nicolette Bang, DO  Cardiologist:   Dorris Carnes, MD   Pt referred by Durene Fruits for chest pressure, SOB    History of Present Illness: Karen [name] is a 49 y.o. female who is followed by Durene Fruits   She has a hx of HTN, morbid obesity.   She said she felt OK until November 2020 when she developed COVID  Was SOB   This got worse in Dec, Jan   Improved some in Feb and then got worse at end of Feb 2021  SOB was at rest and with exertion. In Feb she also developed chest tightness, heaviness that radiated to arm.  The pt went to ED in March 2021.  CTA done and it was + for PE   Found to be + again for COVID  LE dopplers were negative for DVT   She was discharged on Eliqus    I saw her after this in clinic   I ordered an echocardiograim   This showed that both the RV and LV pumped normally    I recomm trial of intemitt lasix with K   Limit salt and fluid intake    Since I saw her she has been seen again in pulmonary.   REcomm continued apixaban for 12 months.  They also recomm overnight pulse ox, LE dopplers   ANd, they referred to bariatric surgery     The pt says her breathing is about the same   She denies CP    Has appt with bariatric surgery for procedure this month   Needs to come off of Eliquis for this    Pt has strong FHX of CAD; father had Hx of early CAD   I asw the pt in July 2021  No outpatient medications have been marked as taking for the 03/14/20 encounter (Appointment) with Fay Records, MD.     Allergies:   Nsaids and Other   Past Medical History:  Diagnosis Date  . Acute pulmonary embolism ([place]) 06/26/2019  . Arthritis    Left knee  . Carpal tunnel syndrome of right wrist 02/10/2018  . Essential hypertension 10/26/2016  . Family history of early CAD   . Gallstone 06/25/2018  . Hypertension   . Incidental lung nodule 06/26/2019  .  Menorrhagia 02/22/2015  . Morbid obesity ([place]) 11/10/2012   BMI 63   . Pain in right hand 01/27/2018  . PONV (postoperative nausea and vomiting)   . Right upper quadrant pain 06/25/2018  . Steatosis of liver 06/25/2018    Past Surgical History:  Procedure Laterality Date  . CARPAL TUNNEL RELEASE Right 04/04/2018   Procedure: CARPAL TUNNEL RELEASE;  Surgeon: Latanya Maudlin, MD;  Location: [facility];  Service: Orthopedics;  Laterality: Right;  90min  . CHOLECYSTECTOMY    . gastic sleeve  2015  . HYSTEROSCOPY WITH NOVASURE N/A 03/29/2015   Procedure: HYSTEROSCOPY WITH NOVASURE;  Surgeon: Anastasio Auerbach, MD;  Location: [facility];  Service: Gynecology;  Laterality: N/A;  to follow first case around 10:15am.  Requests one hour OR time.  . INTRAUTERINE DEVICE INSERTION     X 2  Spontaneously extruded X 2.   2014/2015  . TUBAL LIGATION  approximately 2000   as an interval procedure historically     Social History:  The patient  reports that she has quit smoking.  She has never used smokeless tobacco. She reports current alcohol use. She reports that she does not use drugs.   Family History:  The patient's family history includes Cancer in an other family member; Heart attack in her father and another family member; Hyperlipidemia in her father; Hypertension in her mother and another family member; Parkinson's disease in her mother; Stroke in an other family member.    ROS:  Please see the history of present illness. All other systems are reviewed and  Negative to the above problem except as noted.    PHYSICAL EXAM: VS:  There were no vitals taken for this visit.  GEN: Morbidly obese 49 yo  in no acute distress  HEENT: normal  Neck: no obvious JVD Cardiac: RRR; no murmurs, rubs, or gallops, 1+ LE edema  Respiratory:  Rel clear to auscul  No wheezes   No rales GI: soft, Obese   nontender  No RUQ tenderness  MS: no deformity Moving all extremities   Skin: warm and dry, no rash Neuro:   Strength and sensation are intact Psych: euthymic mood, full affect   EKG:  EKG is not ordered today.      CT scan 06/25/19 IMPRESSION: Peripheral pulmonary embolism, nonocclusive.  No right heart strain.  A 6 mm right middle lobe subsolid fissural based nodule of unknown stability. Initial follow-up with CT at 6-12 months is recommended to confirm persistence. If persistent, repeat CT is recommended every 2 years until 5 years of stability has been established. This recommendation follows the consensus statement: Guidelines for Management of Incidental Pulmonary Nodules Detected on CT Images: From the Fleischner Society 2017; Radiology 2017; 284:228-243.  Mild three-vessel coronary calcification.  Lipid Panel    Component Value Date/Time   CHOL 168 10/09/2019 1220   TRIG 64 10/09/2019 1220   HDL 41 10/09/2019 1220   CHOLHDL 4.1 10/09/2019 1220   CHOLHDL 4.7 11/10/2012 0955   VLDL 14 11/10/2012 0955   LDLCALC 115 (H) 10/09/2019 1220      Wt Readings from Last 3 Encounters:  02/23/20 (!) 303 lb (137.4 kg)  02/12/20 (!) 308 lb (139.7 kg)  10/09/19 (!) 360 lb 6.4 oz (163.5 kg)      ASSESSMENT AND PLAN:  1  Dyspnea.  Relatively stable   Multifactoral (PE, morbid obesity)  I do not think she is signif volume overloaded   Watch salt  2   CAD  Pt with mild calcifications.   No symptoms to sugg angina   3  Lipids   Check lipids today      4  HTN   BP is  OK  5  Bariatric surgery   Will forward to pulmonary re discontinuing Eliquis   F/U in clinic this winter    Current medicines are reviewed at length with the patient today.  The patient does not have concerns regarding medicines.  Signed, Dorris Carnes, MD  03/13/2020 10:36 PM    [address], [place], [place]  [address] Phone: [phone]; Fax: [phone]

## 2020-03-14 ENCOUNTER — Ambulatory Visit: Payer: 59 | Admitting: Internal Medicine

## 2020-03-14 NOTE — Telephone Encounter (Signed)
Ultrasound is scheduled for 03/18/2020 @ 10:45 AM at [facility].

## 2020-03-18 ENCOUNTER — Ambulatory Visit (HOSPITAL_COMMUNITY): Payer: 59 | Attending: Family Medicine

## 2020-05-13 ENCOUNTER — Ambulatory Visit: Payer: 59 | Admitting: Internal Medicine

## 2020-05-19 DIAGNOSIS — G4733 Obstructive sleep apnea (adult) (pediatric): Secondary | ICD-10-CM | POA: Diagnosis not present

## 2020-05-26 ENCOUNTER — Telehealth: Payer: Self-pay | Admitting: Internal Medicine

## 2020-05-26 NOTE — Telephone Encounter (Signed)
Pt's son dropped off enveloped with PCP's name only,& gave mother's name to inform the envelope is the Pt's Karen [name]. Pt's son asked to give to PCP. / Pt, Karen [name], called to clarify. An appt was scheduled for forms to be filled out / follow up. Envelope was handed to PCP.

## 2020-05-27 ENCOUNTER — Ambulatory Visit: Payer: 59 | Admitting: Internal Medicine

## 2020-06-09 ENCOUNTER — Ambulatory Visit: Payer: 59 | Admitting: Internal Medicine

## 2020-06-13 ENCOUNTER — Other Ambulatory Visit: Payer: Self-pay

## 2020-06-13 ENCOUNTER — Telehealth (INDEPENDENT_AMBULATORY_CARE_PROVIDER_SITE_OTHER): Payer: Self-pay | Admitting: Internal Medicine

## 2020-06-13 ENCOUNTER — Encounter: Payer: Self-pay | Admitting: Internal Medicine

## 2020-06-13 VITALS — BP 118/76 | Ht 62.0 in | Wt 261.0 lb

## 2020-06-13 DIAGNOSIS — Z0289 Encounter for other administrative examinations: Secondary | ICD-10-CM

## 2020-06-13 NOTE — Progress Notes (Signed)
Patient has a visit to discuss paperwork. Son dropped paperwork off on 2/17 for patient to be able to work from home due to being high risk of contracting COVID. Unfortunately, patient also provided documentation of that time of another recent +COVID test. I completed paperwork that day and returned to RN. Unable to locate paperwork at present. Office will search and send as appropriate. If unable to locate, I will complete documentation again.   Phill Myron, D.O. Primary Care at [facility]  06/13/2020, 1:55 PM

## 2020-06-14 ENCOUNTER — Other Ambulatory Visit: Payer: Self-pay | Admitting: Internal Medicine

## 2020-06-14 DIAGNOSIS — I10 Essential (primary) hypertension: Secondary | ICD-10-CM

## 2020-06-14 DIAGNOSIS — I2699 Other pulmonary embolism without acute cor pulmonale: Secondary | ICD-10-CM

## 2020-06-16 DIAGNOSIS — G4733 Obstructive sleep apnea (adult) (pediatric): Secondary | ICD-10-CM | POA: Diagnosis not present

## 2020-06-20 ENCOUNTER — Telehealth: Payer: Self-pay | Admitting: Internal Medicine

## 2020-06-20 NOTE — Telephone Encounter (Signed)
[facility] faxed over preauthorization on 3/39/22 for ELIQUIS.  [employer] is faxing again today. Pt new insurance [employer] requires preauth.  Pt checking status. Pt ph [phone].

## 2020-06-24 NOTE — Telephone Encounter (Addendum)
Pt checking status of ELIQUIS Prior Auth. What other med should she take while waiting. Pt concerned due to it is a blood thinner. Please advise. [place] [facility] [phone] TO START PRIOR AUTH. Pt request Call pt today with update.

## 2020-06-25 NOTE — Telephone Encounter (Signed)
Has the PA been completed?   Phill Myron, D.O. Primary Care at [facility]  06/25/2020, 12:39 AM

## 2020-06-27 NOTE — Telephone Encounter (Signed)
Pt called checking status. Pt upset due to medication is Eliquis.  [employer] has not received PA from provider per pt. Please confirm it is in process.

## 2020-06-28 ENCOUNTER — Other Ambulatory Visit: Payer: Self-pay

## 2020-06-28 ENCOUNTER — Other Ambulatory Visit: Payer: Self-pay | Admitting: Internal Medicine

## 2020-06-28 ENCOUNTER — Ambulatory Visit
Admission: EM | Admit: 2020-06-28 | Discharge: 2020-06-28 | Discharge status: Against Medical Advice | Payer: BC Managed Care – PPO

## 2020-06-28 DIAGNOSIS — I2699 Other pulmonary embolism without acute cor pulmonale: Secondary | ICD-10-CM

## 2020-06-28 NOTE — Telephone Encounter (Signed)
Spoke to pt instructed her to upload new Insurance card in [place]. Confirmed receipt.  Called [facility] [address] confirmed RX [facility]. [facility] is [facility] for PCP to fill out.

## 2020-06-30 NOTE — Telephone Encounter (Signed)
Per provider's assistant, PA has been done.

## 2020-07-04 NOTE — Telephone Encounter (Signed)
Please fill blood thinner

## 2020-07-17 DIAGNOSIS — G4733 Obstructive sleep apnea (adult) (pediatric): Secondary | ICD-10-CM | POA: Diagnosis not present

## 2020-08-05 ENCOUNTER — Encounter: Payer: Self-pay | Admitting: Family Medicine

## 2020-08-05 ENCOUNTER — Ambulatory Visit: Payer: BC Managed Care – PPO | Admitting: Family Medicine

## 2020-08-05 ENCOUNTER — Ambulatory Visit: Payer: Self-pay

## 2020-08-05 ENCOUNTER — Other Ambulatory Visit: Payer: Self-pay

## 2020-08-05 DIAGNOSIS — M1712 Unilateral primary osteoarthritis, left knee: Secondary | ICD-10-CM

## 2020-08-05 DIAGNOSIS — M1711 Unilateral primary osteoarthritis, right knee: Secondary | ICD-10-CM | POA: Diagnosis not present

## 2020-08-05 NOTE — Progress Notes (Signed)
Office Visit Note   Patient: Karen [name]           Date of Birth: [birth year]           MRN: [id number] Visit Date: 08/05/2020 Requested by: Nicolette Bang, DO [address] [place],  [place] [address] PCP: Nicolette Bang, DO  Subjective: Chief Complaint  Patient presents with  . Right Knee - Pain    S/p bilateral cortisone injections 11/10/19. The pain returned in January, but not as severe. The pain has worsened. Requests bilateral cortisone injections today.  . Left Knee - Pain    HPI: She is here with bilateral knee pain.  She did well for a while after last injections, started hurting again 3 months ago.              ROS:   All other systems were reviewed and are negative.  Objective: Vital Signs: There were no vitals taken for this visit.  Physical Exam:  General:  Alert and oriented, in no acute distress. Pulm:  Breathing unlabored. Psy:  Normal mood, congruent affect. Skin: No erythema Knees: Trace effusion bilaterally with no warmth.  Imaging: No results found.  Assessment & Plan: 1.  Bilateral knee osteoarthritis -Steroid injections today under ultrasound guidance.  Follow-up as needed.     Procedures: After sterile prep with Betadine, injected 3 cc 0.25% bupivacaine and 40 mg Depo-Medrol from lateral midpatellar approach into the lateral joint recess using ultrasound to guide bilateral knee injections.       PMFS History: Patient Active Problem List   Diagnosis Date Noted  . OSA (obstructive sleep apnea) 10/26/2019  . Nocturnal hypoxemia due to obesity 10/26/2019  . Depression 07/29/2019  . Chronic anticoagulation 07/29/2019  . Nocturnal dyspnea 07/29/2019  . History of 2019 novel coronavirus disease (COVID-19) 07/29/2019  . Right leg pain 07/29/2019  . Brain fog 07/29/2019  . Acute pulmonary embolism ([place]) 06/26/2019  . Incidental lung nodule 06/26/2019  . Steatosis of liver 06/25/2018  . Carpal tunnel syndrome of  right wrist 02/10/2018  . Essential hypertension 10/26/2016  . Menorrhagia 02/22/2015  . Morbid obesity ([place]) 11/10/2012   Past Medical History:  Diagnosis Date  . Acute pulmonary embolism ([place]) 06/26/2019  . Arthritis    Left knee  . Carpal tunnel syndrome of right wrist 02/10/2018  . Essential hypertension 10/26/2016  . Family history of early CAD   . Gallstone 06/25/2018  . Hypertension   . Incidental lung nodule 06/26/2019  . Menorrhagia 02/22/2015  . Morbid obesity ([place]) 11/10/2012   BMI 63   . Pain in right hand 01/27/2018  . PONV (postoperative nausea and vomiting)   . Right upper quadrant pain 06/25/2018  . Steatosis of liver 06/25/2018    Family History  Problem Relation Age of Onset  . Hyperlipidemia Father   . Heart attack Father   . Heart attack Other   . Hypertension Other   . Cancer Other   . Stroke Other   . Hypertension Mother   . Parkinson's disease Mother     Past Surgical History:  Procedure Laterality Date  . CARPAL TUNNEL RELEASE Right 04/04/2018   Procedure: CARPAL TUNNEL RELEASE;  Surgeon: Latanya Maudlin, MD;  Location: [facility];  Service: Orthopedics;  Laterality: Right;  68min  . CHOLECYSTECTOMY    . gastic sleeve  2015  . HYSTEROSCOPY WITH NOVASURE N/A 03/29/2015   Procedure: HYSTEROSCOPY WITH NOVASURE;  Surgeon: Anastasio Auerbach, MD;  Location: [facility];  Service: Gynecology;  Laterality: N/A;  to follow first case around 10:15am.  Requests one hour OR time.  . INTRAUTERINE DEVICE INSERTION     X 2  Spontaneously extruded X 2.   2014/2015  . TUBAL LIGATION  approximately 2000   as an interval procedure historically   Social History   Occupational History  . Not on file  Tobacco Use  . Smoking status: Former Research scientist (life sciences)  . Smokeless tobacco: Never Used  Vaping Use  . Vaping Use: Never used  Substance and Sexual Activity  . Alcohol use: Yes    Comment: occas   . Drug use: No  . Sexual activity: Yes    Birth control/protection: Surgical     Comment: BTL

## 2020-08-16 DIAGNOSIS — G4733 Obstructive sleep apnea (adult) (pediatric): Secondary | ICD-10-CM | POA: Diagnosis not present

## 2020-09-09 ENCOUNTER — Ambulatory Visit: Payer: BC Managed Care – PPO | Admitting: Internal Medicine

## 2020-09-11 ENCOUNTER — Other Ambulatory Visit: Payer: Self-pay | Admitting: Internal Medicine

## 2020-09-11 DIAGNOSIS — I10 Essential (primary) hypertension: Secondary | ICD-10-CM

## 2020-09-16 DIAGNOSIS — G4733 Obstructive sleep apnea (adult) (pediatric): Secondary | ICD-10-CM | POA: Diagnosis not present

## 2020-09-26 ENCOUNTER — Other Ambulatory Visit: Payer: Self-pay | Admitting: Internal Medicine

## 2020-09-26 ENCOUNTER — Telehealth: Payer: Self-pay | Admitting: Internal Medicine

## 2020-09-26 NOTE — Telephone Encounter (Signed)
Pt called stating she is out of her medications and needs a refill  ELIQUIS 5 MG TABS tablet    Pharmacy  [id number] [name], [place] [name] [name]  [address]  Phone:  [phone]  Fax:  [phone]  DEA #:  [id number]

## 2020-09-27 ENCOUNTER — Other Ambulatory Visit: Payer: Self-pay

## 2020-09-27 DIAGNOSIS — I2699 Other pulmonary embolism without acute cor pulmonale: Secondary | ICD-10-CM

## 2020-09-27 MED ORDER — ELIQUIS 5 MG PO TABS: 1.0000 | ORAL_TABLET | Freq: Two times a day (BID) | ORAL | 0 refills | Status: DC

## 2020-09-27 NOTE — Telephone Encounter (Signed)
Patient called stating she is out of her medication and needs them today.

## 2020-09-27 NOTE — Progress Notes (Signed)
Eliquis refilled for pt

## 2020-10-16 DIAGNOSIS — Z20822 Contact with and (suspected) exposure to covid-19: Secondary | ICD-10-CM | POA: Diagnosis not present

## 2020-10-16 DIAGNOSIS — G4733 Obstructive sleep apnea (adult) (pediatric): Secondary | ICD-10-CM | POA: Diagnosis not present

## 2020-12-04 ENCOUNTER — Ambulatory Visit
Admission: EM | Admit: 2020-12-04 | Discharge: 2020-12-04 | Disposition: A | Discharge status: Home / Self Care | Payer: BC Managed Care – PPO | Attending: Internal Medicine | Admitting: Internal Medicine

## 2020-12-04 ENCOUNTER — Ambulatory Visit: Payer: BC Managed Care – PPO

## 2020-12-04 ENCOUNTER — Other Ambulatory Visit: Payer: Self-pay

## 2020-12-04 DIAGNOSIS — R197 Diarrhea, unspecified: Secondary | ICD-10-CM | POA: Diagnosis not present

## 2020-12-04 DIAGNOSIS — R1084 Generalized abdominal pain: Secondary | ICD-10-CM | POA: Diagnosis not present

## 2020-12-04 MED ORDER — SUCRALFATE 1 G PO TABS: 1.0000 g | ORAL_TABLET | Freq: Three times a day (TID) | ORAL | 0 refills | Status: AC

## 2020-12-04 NOTE — ED Provider Notes (Addendum)
[name]    [name] Arrival date & time: 12/04/20  [id number]      History   Chief Complaint Chief Complaint  Patient presents with   Abdominal Pain   Diarrhea    HPI Karen [name] is a 50 y.o. female.   Patient presents with crampy abdominal pain that has been present for approximately 2 to 3 days.  Patient reports that the pain starts in the right upper quadrant and radiates throughout abdomen when it occurs.  Pain is intermittent per patient.  Food aggravates pain.  Patient has also been having diarrhea at times.  Denies any blood in stool.  Denies any nausea, vomiting, chest pain, blurred vision, shortness of breath.  Has had some dizziness and headache since symptoms started.  Patient has been able to eat but states that the food aggravates her stomach pain, so she has not been eating much.  Patient has history of bariatric surgery in 2015 and another surgery in 2021.  Patient is currently taking Protonix.  Has also had gallbladder removed.  Denies any Fevers.   Abdominal Pain Diarrhea  Past Medical History:  Diagnosis Date   Acute pulmonary embolism ([place]) 06/26/2019   Arthritis    Left knee   Carpal tunnel syndrome of right wrist 02/10/2018   Essential hypertension 10/26/2016   Family history of early CAD    Gallstone 06/25/2018   Hypertension    Incidental lung nodule 06/26/2019   Menorrhagia 02/22/2015   Morbid obesity ([place]) 11/10/2012   BMI 63    Pain in right hand 01/27/2018   PONV (postoperative nausea and vomiting)    Right upper quadrant pain 06/25/2018   Steatosis of liver 06/25/2018    Patient Active Problem List   Diagnosis Date Noted   OSA (obstructive sleep apnea) 10/26/2019   Nocturnal hypoxemia due to obesity 10/26/2019   Depression 07/29/2019   Chronic anticoagulation 07/29/2019   Nocturnal dyspnea 07/29/2019   History of 2019 novel coronavirus disease (COVID-19) 07/29/2019   Right leg pain 07/29/2019   Brain fog 07/29/2019    Acute pulmonary embolism ([place]) 06/26/2019   Incidental lung nodule 06/26/2019   Steatosis of liver 06/25/2018   Carpal tunnel syndrome of right wrist 02/10/2018   Essential hypertension 10/26/2016   Menorrhagia 02/22/2015   Morbid obesity ([place]) 11/10/2012    Past Surgical History:  Procedure Laterality Date   CARPAL TUNNEL RELEASE Right 04/04/2018   Procedure: CARPAL TUNNEL RELEASE;  Surgeon: Latanya Maudlin, MD;  Location: [facility];  Service: Orthopedics;  Laterality: Right;  56mn   CHOLECYSTECTOMY     gastic sleeve  2015   HYSTEROSCOPY WITH NOVASURE N/A 03/29/2015   Procedure: HYSTEROSCOPY WITH NOVASURE;  Surgeon: TAnastasio Auerbach MD;  [facility];  Service: Gynecology;  Laterality: N/A;  to follow first case around 10:15am.  Requests one hour OR time.   INTRAUTERINE DEVICE INSERTION     X 2  Spontaneously extruded X 2.   2014/2015   TUBAL LIGATION  approximately 2000   as an interval procedure historically    OB History     Gravida  2   Para  2   Term      Preterm      AB      Living         SAB      IAB      Ectopic      Multiple      Live Births  Home Medications    Prior to Admission medications   Medication Sig Start Date End Date Taking? Authorizing Provider  sucralfate (CARAFATE) 1 g tablet Take 1 tablet (1 g total) by mouth 4 (four) times daily -  with meals and at bedtime. 12/04/20  Yes Odis Luster, FNP  albuterol (VENTOLIN HFA) 108 (90 Base) MCG/ACT inhaler TAKE 2 PUFFS BY MOUTH EVERY 6 HOURS AS NEEDED FOR WHEEZE OR SHORTNESS OF BREATH 07/20/19   Nicolette Bang, MD  amLODipine (NORVASC) 10 MG tablet TAKE 1 TABLET BY MOUTH EVERY DAY 09/12/20   Nicolette Bang, MD  apixaban (ELIQUIS) 5 MG TABS tablet Take 1 tablet (5 mg total) by mouth 2 (two) times daily. 09/27/20   Camillia Herter, NP  butalbital-acetaminophen-caffeine (FIORICET) [phone] MG tablet Take 50-235 tablets by mouth as needed. 06/23/19    [provider]  Calcium Carbonate-Vitamin D (CALCIUM-VITAMIN D) 600-125 MG-UNIT TABS Take 1 tablet by mouth daily.    [provider]  CREON 36000-114000 units CPEP capsule Take 1 capsule by mouth 3 (three) times daily with meals. 01/13/20   [provider]  furosemide (LASIX) 40 MG tablet TAKE 1 TABLET (40 MG TOTAL) BY MOUTH EVERY MONDAY. 09/26/20   Fay Records, MD  pantoprazole (PROTONIX) 40 MG tablet TAKE 1 TABLET BY MOUTH EVERY DAY 10/23/19   Nicolette Bang, MD  potassium chloride (KLOR-CON) 10 MEQ tablet TAKE 1 TABLET EVERY MONDAY 09/26/20   Fay Records, MD  rosuvastatin (CRESTOR) 10 MG tablet TAKE 1 TABLET BY MOUTH EVERY DAY 09/26/20   Fay Records, MD  triamcinolone cream (KENALOG) 0.5 % Apply 1 application topically 2 (two) times daily as needed. 02/23/20   Scot Jun, FNP    Family History Family History  Problem Relation Age of Onset   Hyperlipidemia Father    Heart attack Father    Heart attack Other    Hypertension Other    Cancer Other    Stroke Other    Hypertension Mother    Parkinson's disease Mother     Social History Social History   Tobacco Use   Smoking status: Former   Smokeless tobacco: Never  Scientific laboratory technician Use: Never used  Substance Use Topics   Alcohol use: Yes    Comment: occas    Drug use: No     Allergies   Nsaids and Other   Review of Systems Review of Systems per HPI  Physical Exam Triage Vital Signs ED Triage Vitals  Enc Vitals Group     BP 12/04/20 0910 134/70     Pulse Rate 12/04/20 0910 96     Resp 12/04/20 0910 18     Temp 12/04/20 0910 98.5 F (36.9 C)     Temp Source 12/04/20 0910 Oral     SpO2 12/04/20 0910 94 %     Weight --      Height --      Head Circumference --      Peak Flow --      Pain Score 12/04/20 0913 2     Pain Loc --      Pain Edu? --      Excl. in [place]? --    No data found.  Updated Vital Signs BP 134/70 (BP Location: Left Arm)   Pulse 96    Temp 98.5 F (36.9 C) (Oral)   Resp 18   SpO2 94%   Visual Acuity Right Eye Distance:  Left Eye Distance:   Bilateral Distance:    Right Eye Near:   Left Eye Near:    Bilateral Near:     Physical Exam Constitutional:      Appearance: Normal appearance.  HENT:     Head: Normocephalic and atraumatic.     Mouth/Throat:     Mouth: Mucous membranes are moist.     Pharynx: No posterior oropharyngeal erythema.  Eyes:     Extraocular Movements: Extraocular movements intact.     Conjunctiva/sclera: Conjunctivae normal.  Cardiovascular:     Rate and Rhythm: Normal rate and regular rhythm.     Pulses: Normal pulses.     Heart sounds: Normal heart sounds.  Pulmonary:     Effort: Pulmonary effort is normal. No respiratory distress.     Breath sounds: Normal breath sounds. No wheezing or rhonchi.  Abdominal:     General: Bowel sounds are normal. There is no distension.     Palpations: Abdomen is soft.     Tenderness: There is abdominal tenderness in the right upper quadrant and epigastric area. There is no guarding or rebound. Negative signs include Murphy's sign and McBurney's sign.     Hernia: No hernia is present.  Musculoskeletal:        General: Normal range of motion.  Skin:    General: Skin is warm and dry.  Neurological:     General: No focal deficit present.     Mental Status: She is alert and oriented to person, place, and time. Mental status is at baseline.  Psychiatric:        Mood and Affect: Mood normal.        Behavior: Behavior normal.        Thought Content: Thought content normal.        Judgment: Judgment normal.     UC Treatments / Results  Labs (all labs ordered are listed, but only abnormal results are displayed) Labs Reviewed  COMPREHENSIVE METABOLIC PANEL  CBC  AMYLASE  LIPASE    EKG   Radiology No results found.  Procedures Procedures (including critical care time)  Medications Ordered in UC Medications - No data to  display  Initial Impression / Assessment and Plan / UC Course  I have reviewed the triage vital signs and the nursing notes.  Pertinent labs & imaging results that were available during my care of the patient were reviewed by me and considered in my medical decision making (see chart for details).     Differential diagnoses include viral gastroenteritis or stomach ulcer.  Stomach ulcer seems more likely given patient's physical exam and characteristics of clinical symptoms.  Will prescribe Carafate to take in addition to Protonix to try to alleviate symptoms.  CMP, CBC, amylase, lipase ordered but were unable to be obtained by nursing staff. Unable to perform outpatient labs at this facility. Patient was given the option to come back tomorrow after pushing fluids to have labs drawn or to go to the hospital for further evaluation and management. Patient stated that she would prefer to go to the hospital to have blood work drawn and be further evaluated for her abdominal pain.   Final Clinical Impressions(s) / UC Diagnoses   Final diagnoses:  Generalized abdominal pain  Diarrhea, unspecified type     Discharge Instructions      We were unable to get your labwork today. You may push fluids and come back tomorrow for a lab draw or go to the hospital for further  evaluation today.      ED Prescriptions     Medication Sig Dispense Auth. Provider   sucralfate (CARAFATE) 1 g tablet Take 1 tablet (1 g total) by mouth 4 (four) times daily -  with meals and at bedtime. 90 tablet Odis Luster, FNP      PDMP not reviewed this encounter.   Odis Luster, FNP 12/04/20 [place], [place], FNP 12/04/20 [phone]

## 2020-12-04 NOTE — Discharge Instructions (Addendum)
We were unable to get your labwork today. You may push fluids and come back tomorrow for a lab draw or go to the hospital for further evaluation today.

## 2020-12-04 NOTE — ED Notes (Signed)
Attempted labs twice on patient unsuccessfully. Provider notified.

## 2020-12-04 NOTE — ED Triage Notes (Addendum)
Two day h/o intermittent upper abdominal pain and diarrhea. Pt describes pain as "mild cramping". Has been taking Pepcid without relief. Pain resolves spontaneously. Pt notes pain starts in her upper abdomen and radiates to bilateral lower quadrants. No emesis. No known food triggers. No urinary sxs.  Pt is taking Protonix '40mg'$  QD

## 2020-12-07 ENCOUNTER — Telehealth: Payer: Self-pay

## 2020-12-07 NOTE — Telephone Encounter (Signed)
Pt called into the office and wanted to set up an appt for injections. I wasn't sure who you wanted me to set her up with since Dr. Junius Roads also did the ultrasound injections. Please advise

## 2020-12-13 ENCOUNTER — Encounter: Payer: Self-pay | Admitting: Nurse Practitioner

## 2020-12-13 ENCOUNTER — Other Ambulatory Visit: Payer: Self-pay

## 2020-12-13 ENCOUNTER — Ambulatory Visit (INDEPENDENT_AMBULATORY_CARE_PROVIDER_SITE_OTHER): Payer: BC Managed Care – PPO | Admitting: Nurse Practitioner

## 2020-12-13 DIAGNOSIS — R911 Solitary pulmonary nodule: Secondary | ICD-10-CM

## 2020-12-13 DIAGNOSIS — R0602 Shortness of breath: Secondary | ICD-10-CM

## 2020-12-13 DIAGNOSIS — I2699 Other pulmonary embolism without acute cor pulmonale: Secondary | ICD-10-CM

## 2020-12-13 MED ORDER — APIXABAN 5 MG PO TABS: 5.0000 mg | ORAL_TABLET | Freq: Two times a day (BID) | ORAL | 0 refills | Status: DC

## 2020-12-13 NOTE — Patient Instructions (Signed)
History of PE Lung nodules Shortness of breath:   Will place referral to pulmonary   Please discuss discontinuing Eliquis at appointment with pulmonary   May need PFT   May need repeat CT scan - lung nodules     Follow up:   Follow up - will schedule appointment to establish care with pulmonary

## 2020-12-13 NOTE — Addendum Note (Signed)
Addended by: Fenton Foy on: 12/13/2020 10:48 AM   Modules accepted: Level of Service

## 2020-12-13 NOTE — Progress Notes (Signed)
Virtual Visit via Telephone Note  I connected with Karen [name] on 12/13/20 at 10:10 AM EDT by telephone and verified that I am speaking with the correct person using two identifiers.  Location: Patient: home Provider: office   I discussed the limitations, risks, security and privacy concerns of performing an evaluation and management service by telephone and the availability of in person appointments. I also discussed with the patient that there may be a patient responsible charge related to this service. The patient expressed understanding and agreed to proceed  50 year old female with history of obesity, HTN. Diagnosed with Covid November 2020 and March 2021.    Recent Significant Encounters:    06/25/19 - 06/26/19 Hospital Admission: Admitted for PE. Doppler lower ext negative for DVT. Started on Eliquis. Incidental lung nodule - repeat CT in 6-12 months.    07/29/19 Pulmonary: Continue apixaban for 12 months, ordered overnight sleep oximetry, repeat vascular US, referred to counseling for depression, following lung nodule. Referred to bariatric clinic for obesity.    08/18/19 Neurology: Migraine medications changed, CT of head ordered, advised to keep headache diary   Tests:    Imaging:    06/25/19 CTA: Peripheral pulmonary embolism, nonocclusive.  No right heart strain. A 6 mm right middle lobe subsolid fissural based nodule of unknown stability. Initial follow-up with CT at 6-12 months is recommended to confirm persistence. If persistent, repeat CT is recommended every 2 years until 5 years of stability has been established. This recommendation follows the consensus statement: Guidelines for Management of Incidental Pulmonary Nodules Detected on CT Images: From the Fleischner Society 2017; Radiology 2017; 284:228-243. Mild three-vessel coronary calcification.     History of Present Illness:  Patient presents today for televisit for medication refill.  Patient is a former patient  of Dr. Juleen China.  Patient does have a history of COVID with pulmonary embolism in March 2021.  She was started on Eliquis at that time.  She was advised to stay on this medication for 3 to 6 months.  Patient has been on the medication for a year and a half at this point.  The medication was never DC'd.   Patient was also noted to have lung nodules in March 2021 and was advised to have repeat CT scan in 6 to 12 months.  Patient failed to follow-up on this.    Note: Patient was followed by me for post-COVID but failed to follow-up after her last appointment in June 2021.  She failed to complete physical therapy and speech therapy that was ordered by me.  She states that she is still having shortness of breath and memory loss. She was referred to pulmonary in June 2021 by me.  Per note in the chart pulmonary tried to call her with multiple attempts and closed referral after patient did not return calls. Will place new referral today.  Note: Patient states that she has a trip coming up in October with a 12-hour flight and is concerned about stopping Eliquis at this point.  She is requesting to stay on until after her trip.  We discussed that she can do this and we will try to get her a follow-up appointment once again with pulmonary to follow-up on needed CT scan for lung nodule and possible PFT for ongoing shortness of breath.  She can also discuss discontinuing Eliquis with pulmonary at that time.  Denies f/c/s, n/v/d, hemoptysis, PND, chest pain or edema    Observations/Objective:  Vitals with BMI 12/04/2020  06/13/2020 02/23/2020  Height - '5\' 2"'$  -  Weight - 261 lbs 303 lbs  BMI - 99991111 -  Systolic [id number]  Diastolic 70 76 96  Pulse 96 - 89      Assessment and Plan:  History of PE Lung nodules Shortness of breath:   Will place referral to pulmonary   Please discuss discontinuing Eliquis at appointment with pulmonary   May need PFT   May need repeat CT scan - lung nodules     Follow  up:   Follow up - will schedule appointment to establish care with pulmonary    I discussed the assessment and treatment plan with the patient. The patient was provided an opportunity to ask questions and all were answered. The patient agreed with the plan and demonstrated an understanding of the instructions.   The patient was advised to call back or seek an in-person evaluation if the symptoms worsen or if the condition fails to improve as anticipated.  I provided 23 minutes of non-face-to-face time during this encounter.   Fenton Foy, NP

## 2020-12-18 ENCOUNTER — Other Ambulatory Visit: Payer: Self-pay | Admitting: Internal Medicine

## 2020-12-23 ENCOUNTER — Encounter: Payer: Self-pay | Admitting: Surgical

## 2020-12-23 ENCOUNTER — Ambulatory Visit (INDEPENDENT_AMBULATORY_CARE_PROVIDER_SITE_OTHER): Payer: BC Managed Care – PPO | Admitting: Surgical

## 2020-12-23 ENCOUNTER — Other Ambulatory Visit: Payer: Self-pay

## 2020-12-23 DIAGNOSIS — M170 Bilateral primary osteoarthritis of knee: Secondary | ICD-10-CM

## 2020-12-23 DIAGNOSIS — M1712 Unilateral primary osteoarthritis, left knee: Secondary | ICD-10-CM | POA: Diagnosis not present

## 2020-12-23 DIAGNOSIS — M1711 Unilateral primary osteoarthritis, right knee: Secondary | ICD-10-CM | POA: Diagnosis not present

## 2020-12-24 ENCOUNTER — Encounter: Payer: Self-pay | Admitting: Surgical

## 2020-12-24 MED ORDER — METHYLPREDNISOLONE ACETATE 40 MG/ML IJ SUSP
40.0000 mg | INTRAMUSCULAR | Status: AC | PRN
Start: 2020-12-23 — End: 2020-12-23
  Administered 2020-12-23: 40 mg via INTRA_ARTICULAR

## 2020-12-24 MED ORDER — METHYLPREDNISOLONE ACETATE 40 MG/ML IJ SUSP
40.0000 mg | INTRAMUSCULAR | Status: AC | PRN
  Administered 2020-12-23: 40 mg via INTRA_ARTICULAR

## 2020-12-24 MED ORDER — LIDOCAINE HCL 1 % IJ SOLN
5.0000 mL | INTRAMUSCULAR | Status: AC | PRN
  Administered 2020-12-23: 5 mL

## 2020-12-24 MED ORDER — BUPIVACAINE HCL 0.25 % IJ SOLN
4.0000 mL | INTRAMUSCULAR | Status: AC | PRN
  Administered 2020-12-23: 4 mL via INTRA_ARTICULAR

## 2020-12-24 NOTE — Progress Notes (Signed)
Office Visit Note   Patient: Karen [name]           Date of Birth: April 03, 1971           MRN: [id number] Visit Date: 12/23/2020 Requested by: No referring provider defined for this encounter. PCP: Pcp, No  Subjective: Chief Complaint  Patient presents with   Right Knee - Follow-up   Left Knee - Follow-up    HPI: Karen [name] is a 50 y.o. female who presents to the office complaining of bilateral knee pain.  She has history of bilateral knee osteoarthritis with prior injections by Dr. Junius Roads.  Last injections were in late April 2022.  They usually last for 3 to 4 months according to her.  She is here today for repeat injections..                ROS: All systems reviewed are negative as they relate to the chief complaint within the history of present illness.  Patient denies fevers or chills.  Assessment & Plan: Visit Diagnoses:  1. Unilateral primary osteoarthritis, right knee   2. Unilateral primary osteoarthritis, left knee     Plan: Patient is a 50 year old female who presents for bilateral knee injections.  Last injections by Dr. Junius Roads in late April 2022.  Bilateral knee injections were successfully administered and patient tolerated the procedure well.  Excellent flow was felt as injection was delivered into the joint.  Plan for her to follow-up with Dr. Marlou Sa in 3 to 4 months for repeat injections.  She has had prior gel injections which have not really done anything for her so plan to just stick with cortisone for the future.  Did caution her against taking anti-inflammatories due to her being on Eliquis.  Stick with Tylenol for pain control.  Follow-Up Instructions: No follow-ups on file.   Orders:  No orders of the defined types were placed in this encounter.  No orders of the defined types were placed in this encounter.     Procedures: Large Joint Inj: bilateral knee on 12/23/2020 5:31 AM Indications: diagnostic evaluation, joint swelling and pain Details: 18  G 1.5 in needle, superolateral approach  Arthrogram: No  Medications (Right): 5 mL lidocaine 1 %; 4 mL bupivacaine 0.25 %; 40 mg methylPREDNISolone acetate 40 MG/ML Medications (Left): 5 mL lidocaine 1 %; 4 mL bupivacaine 0.25 %; 40 mg methylPREDNISolone acetate 40 MG/ML Outcome: tolerated well, no immediate complications Procedure, treatment alternatives, risks and benefits explained, specific risks discussed. Consent was given by the patient. Immediately prior to procedure a time out was called to verify the correct patient, procedure, equipment, support staff and site/side marked as required. Patient was prepped and draped in the usual sterile fashion.      Clinical Data: No additional findings.  Objective: Vital Signs: There were no vitals taken for this visit.  Physical Exam:  Constitutional: Patient appears well-developed HEENT:  Head: Normocephalic Eyes:EOM are normal Neck: Normal range of motion Cardiovascular: Normal rate Pulmonary/chest: Effort normal Neurologic: Patient is alert Skin: Skin is warm Psychiatric: Patient has normal mood and affect  Ortho Exam: Ortho exam demonstrates bilateral knees without effusion.  0 degrees extension.  No calf tenderness bilaterally.  Able to perform straight leg raise with extensor mechanism intact.  Specialty Comments:  No specialty comments available.  Imaging: No results found.   PMFS History: Patient Active Problem List   Diagnosis Date Noted   OSA (obstructive sleep apnea) 10/26/2019   Nocturnal hypoxemia due to  obesity 10/26/2019   Depression 07/29/2019   Chronic anticoagulation 07/29/2019   Nocturnal dyspnea 07/29/2019   History of 2019 novel coronavirus disease (COVID-19) 07/29/2019   Right leg pain 07/29/2019   Brain fog 07/29/2019   Acute pulmonary embolism ([place]) 06/26/2019   Incidental lung nodule 06/26/2019   Steatosis of liver 06/25/2018   Carpal tunnel syndrome of right wrist 02/10/2018   Essential  hypertension 10/26/2016   Menorrhagia 02/22/2015   Morbid obesity ([place]) 11/10/2012   Past Medical History:  Diagnosis Date   Acute pulmonary embolism ([name]) 06/26/2019   Arthritis    Left knee   Carpal tunnel syndrome of right wrist 02/10/2018   Essential hypertension 10/26/2016   Family history of early CAD    Gallstone 06/25/2018   Hypertension    Incidental lung nodule 06/26/2019   Menorrhagia 02/22/2015   Morbid obesity ([place]) 11/10/2012   BMI 63    Pain in right hand 01/27/2018   PONV (postoperative nausea and vomiting)    Right upper quadrant pain 06/25/2018   Steatosis of liver 06/25/2018    Family History  Problem Relation Age of Onset   Hyperlipidemia Father    Heart attack Father    Heart attack Other    Hypertension Other    Cancer Other    Stroke Other    Hypertension Mother    Parkinson's disease Mother     Past Surgical History:  Procedure Laterality Date   CARPAL TUNNEL RELEASE Right 04/04/2018   Procedure: CARPAL TUNNEL RELEASE;  Surgeon: Latanya Maudlin, MD;  Location: [facility];  Service: Orthopedics;  Laterality: Right;  45mn   CHOLECYSTECTOMY     gastic sleeve  2015   HYSTEROSCOPY WITH NOVASURE N/A 03/29/2015   Procedure: HYSTEROSCOPY WITH NOVASURE;  Surgeon: TAnastasio Auerbach MD;  [facility];  Service: Gynecology;  Laterality: N/A;  to follow first case around 10:15am.  Requests one hour OR time.   INTRAUTERINE DEVICE INSERTION     X 2  Spontaneously extruded X 2.   2014/2015   TUBAL LIGATION  approximately 2000   as an interval procedure historically   Social History   Occupational History   Not on file  Tobacco Use   Smoking status: Former   Smokeless tobacco: Never  VScientific laboratory technicianUse: Never used  Substance and Sexual Activity   Alcohol use: Yes    Comment: occas    Drug use: No   Sexual activity: Yes    Birth control/protection: Surgical    Comment: BTL

## 2020-12-28 DIAGNOSIS — K9089 Other intestinal malabsorption: Secondary | ICD-10-CM | POA: Diagnosis not present

## 2020-12-28 DIAGNOSIS — Z9884 Bariatric surgery status: Secondary | ICD-10-CM | POA: Diagnosis not present

## 2020-12-28 DIAGNOSIS — Z6841 Body Mass Index (BMI) 40.0 and over, adult: Secondary | ICD-10-CM | POA: Diagnosis not present

## 2020-12-28 DIAGNOSIS — R1013 Epigastric pain: Secondary | ICD-10-CM | POA: Diagnosis not present

## 2021-01-17 ENCOUNTER — Other Ambulatory Visit: Payer: Self-pay | Admitting: *Deleted

## 2021-01-17 DIAGNOSIS — I10 Essential (primary) hypertension: Secondary | ICD-10-CM

## 2021-01-17 MED ORDER — AMLODIPINE BESYLATE 10 MG PO TABS: 10.0000 mg | ORAL_TABLET | Freq: Every day | ORAL | 0 refills | Status: DC

## 2021-01-21 DIAGNOSIS — Z20822 Contact with and (suspected) exposure to covid-19: Secondary | ICD-10-CM | POA: Diagnosis not present

## 2021-03-08 DIAGNOSIS — Z03818 Encounter for observation for suspected exposure to other biological agents ruled out: Secondary | ICD-10-CM | POA: Diagnosis not present

## 2021-03-08 DIAGNOSIS — Z20822 Contact with and (suspected) exposure to covid-19: Secondary | ICD-10-CM | POA: Diagnosis not present

## 2021-03-10 ENCOUNTER — Ambulatory Visit: Payer: BC Managed Care – PPO | Admitting: Family Medicine

## 2021-03-20 ENCOUNTER — Other Ambulatory Visit: Payer: Self-pay | Admitting: Nurse Practitioner

## 2021-03-20 DIAGNOSIS — I2699 Other pulmonary embolism without acute cor pulmonale: Secondary | ICD-10-CM

## 2021-03-21 ENCOUNTER — Other Ambulatory Visit: Payer: Self-pay | Admitting: Nurse Practitioner

## 2021-03-21 DIAGNOSIS — I2699 Other pulmonary embolism without acute cor pulmonale: Secondary | ICD-10-CM

## 2021-03-21 MED ORDER — APIXABAN 5 MG PO TABS: 5.0000 mg | ORAL_TABLET | Freq: Two times a day (BID) | ORAL | 0 refills | Status: DC

## 2021-03-31 ENCOUNTER — Other Ambulatory Visit: Payer: Self-pay

## 2021-03-31 ENCOUNTER — Ambulatory Visit: Payer: BC Managed Care – PPO | Admitting: Family Medicine

## 2021-03-31 ENCOUNTER — Encounter: Payer: Self-pay | Admitting: Family Medicine

## 2021-03-31 VITALS — BP 123/94 | HR 71 | Temp 98.4°F | Resp 16 | Wt 238.8 lb

## 2021-03-31 DIAGNOSIS — U099 Post covid-19 condition, unspecified: Secondary | ICD-10-CM

## 2021-03-31 DIAGNOSIS — I10 Essential (primary) hypertension: Secondary | ICD-10-CM | POA: Diagnosis not present

## 2021-03-31 DIAGNOSIS — Z6841 Body Mass Index (BMI) 40.0 and over, adult: Secondary | ICD-10-CM

## 2021-03-31 DIAGNOSIS — E785 Hyperlipidemia, unspecified: Secondary | ICD-10-CM

## 2021-03-31 NOTE — Progress Notes (Signed)
Established Patient Office Visit  Subjective:  Patient ID: Karen [name], female    DOB: May 25, 1970  Age: 50 y.o. MRN: [id number]  CC:  Chief Complaint  Patient presents with   Follow-up    3 month follow-up    HPI Karen [name] presents for follow up of covid syndrome. Patient has had covid infection at least 5 separate times. She was hospitalized for covid and had lung issues and she has a follow up on next week with consultant. Patient reports that she still has some brain fog.   Past Medical History:  Diagnosis Date   Acute pulmonary embolism ([place]) 06/26/2019   Arthritis    Left knee   Carpal tunnel syndrome of right wrist 02/10/2018   Essential hypertension 10/26/2016   Family history of early CAD    Gallstone 06/25/2018   Hypertension    Incidental lung nodule 06/26/2019   Menorrhagia 02/22/2015   Morbid obesity ([place]) 11/10/2012   BMI 63    Pain in right hand 01/27/2018   PONV (postoperative nausea and vomiting)    Right upper quadrant pain 06/25/2018   Steatosis of liver 06/25/2018    Past Surgical History:  Procedure Laterality Date   CARPAL TUNNEL RELEASE Right 04/04/2018   Procedure: CARPAL TUNNEL RELEASE;  Surgeon: Latanya Maudlin, MD;  Location: [facility];  Service: Orthopedics;  Laterality: Right;  62min   CHOLECYSTECTOMY     gastic sleeve  2015   HYSTEROSCOPY WITH NOVASURE N/A 03/29/2015   Procedure: HYSTEROSCOPY WITH NOVASURE;  Surgeon: Anastasio Auerbach, MD;  Location: [facility];  Service: Gynecology;  Laterality: N/A;  to follow first case around 10:15am.  Requests one hour OR time.   INTRAUTERINE DEVICE INSERTION     X 2  Spontaneously extruded X 2.   2014/2015   TUBAL LIGATION  approximately 2000   as an interval procedure historically    Family History  Problem Relation Age of Onset   Hyperlipidemia Father    Heart attack Father    Heart attack Other    Hypertension Other    Cancer Other    Stroke Other    Hypertension Mother    Parkinson's  disease Mother     Social History   Socioeconomic History   Marital status: Married    Spouse name: Not on file   Number of children: Not on file   Years of education: Not on file   Highest education level: Not on file  Occupational History   Not on file  Tobacco Use   Smoking status: Former   Smokeless tobacco: Never  Vaping Use   Vaping Use: Never used  Substance and Sexual Activity   Alcohol use: Yes    Comment: occas    Drug use: No   Sexual activity: Yes    Birth control/protection: Surgical    Comment: BTL  Other Topics Concern   Not on file  Social History Narrative   Right handed   Lives with friend, two story home   Social Determinants of Health   Financial Resource Strain: Not on file  Food Insecurity: Not on file  Transportation Needs: Not on file  Physical Activity: Not on file  Stress: Not on file  Social Connections: Not on file  Intimate Partner Violence: Not on file    ROS Review of Systems  Respiratory:  Positive for cough. Negative for shortness of breath.   All other systems reviewed and are negative.  Objective:   Today's Vitals:  BP (!) 123/94    Pulse 71    Temp 98.4 F (36.9 C) (Oral)    Resp 16    Wt 238 lb 12.8 oz (108.3 kg)    SpO2 94%    BMI 43.68 kg/m   Physical Exam Vitals and nursing note reviewed.  Constitutional:      General: She is not in acute distress.    Appearance: She is obese.  Cardiovascular:     Rate and Rhythm: Normal rate and regular rhythm.  Pulmonary:     Effort: Pulmonary effort is normal.     Breath sounds: Normal breath sounds.  Abdominal:     Palpations: Abdomen is soft.     Tenderness: There is no abdominal tenderness. Rebound: .diagmed.  Neurological:     General: No focal deficit present.     Mental Status: She is alert and oriented to person, place, and time.    Assessment & Plan:   1. Essential hypertension Continue present managment  2. Hyperlipidemia, unspecified hyperlipidemia  type Continue present management.   3. Post-COVID syndrome Keep scheduled appt with pulmonary  4. Class 3 severe obesity due to excess calories without serious comorbidity with body mass index (BMI) of 40.0 to 44.9 in adult [facility]) Patient to continue weight loss journey - encouraged.    Outpatient Encounter Medications as of 03/31/2021  Medication Sig   albuterol (VENTOLIN HFA) 108 (90 Base) MCG/ACT inhaler TAKE 2 PUFFS BY MOUTH EVERY 6 HOURS AS NEEDED FOR WHEEZE OR SHORTNESS OF BREATH   amLODipine (NORVASC) 10 MG tablet Take 1 tablet (10 mg total) by mouth daily.   apixaban (ELIQUIS) 5 MG TABS tablet Take 1 tablet (5 mg total) by mouth 2 (two) times daily.   butalbital-acetaminophen-caffeine (FIORICET) 50-325-40 MG tablet Take 50-235 tablets by mouth as needed.   Calcium Carbonate-Vitamin D (CALCIUM-VITAMIN D) 600-125 MG-UNIT TABS Take 1 tablet by mouth daily.   CREON 36000-114000 units CPEP capsule Take 1 capsule by mouth 3 (three) times daily with meals.   furosemide (LASIX) 40 MG tablet Take 1 tablet (40 mg total) by mouth once a week. Pt. Need to schedule an appt in order to receive future refills. Thank you. 2nd attempt.   pantoprazole (PROTONIX) 40 MG tablet TAKE 1 TABLET BY MOUTH EVERY DAY   potassium chloride (KLOR-CON) 10 MEQ tablet TAKE 1 TABLET EVERY MONDAY   rosuvastatin (CRESTOR) 10 MG tablet TAKE 1 TABLET BY MOUTH EVERY DAY   sucralfate (CARAFATE) 1 g tablet Take 1 tablet (1 g total) by mouth 4 (four) times daily -  with meals and at bedtime.   triamcinolone cream (KENALOG) 0.5 % Apply 1 application topically 2 (two) times daily as needed.   No facility-administered encounter medications on file as of 03/31/2021.    Follow-up: No follow-ups on file.   Becky Sax, MD

## 2021-03-31 NOTE — Progress Notes (Signed)
Patient is here for 3 month follow-up. Patient has no concerns

## 2021-04-04 ENCOUNTER — Institutional Professional Consult (permissible substitution): Payer: BC Managed Care – PPO | Admitting: Emergency Medicine

## 2021-04-05 ENCOUNTER — Ambulatory Visit: Payer: BC Managed Care – PPO | Admitting: Pulmonary Disease

## 2021-04-05 ENCOUNTER — Other Ambulatory Visit: Payer: Self-pay

## 2021-04-05 ENCOUNTER — Encounter: Payer: Self-pay | Admitting: Pulmonary Disease

## 2021-04-05 VITALS — BP 120/88 | HR 87 | Temp 97.7°F | Ht 62.0 in | Wt 229.0 lb

## 2021-04-05 DIAGNOSIS — Z7901 Long term (current) use of anticoagulants: Secondary | ICD-10-CM

## 2021-04-05 DIAGNOSIS — I2693 Single subsegmental pulmonary embolism without acute cor pulmonale: Secondary | ICD-10-CM | POA: Diagnosis not present

## 2021-04-05 DIAGNOSIS — R911 Solitary pulmonary nodule: Secondary | ICD-10-CM | POA: Diagnosis not present

## 2021-04-05 DIAGNOSIS — Z6841 Body Mass Index (BMI) 40.0 and over, adult: Secondary | ICD-10-CM | POA: Diagnosis not present

## 2021-04-05 NOTE — Patient Instructions (Addendum)
Thank you for visiting Dr. Valeta Harms at [facility]. Today we recommend the following:  Orders Placed This Encounter  Procedures   CT CHEST WO CONTRAST   Stop eliquis  Start ASA 81mg    Return if symptoms worsen or fail to improve.    Please do your part to reduce the spread of COVID-19.

## 2021-04-05 NOTE — Progress Notes (Signed)
Synopsis: Referred in Dec 2022 for PE h/o COVID, lung nodule by Dorna Mai, MD  Subjective:   PATIENT ID: Karen [name] Saunas GENDER: female DOB: 06-08-1970, MRN: [id number]  Chief Complaint  Patient presents with   Consult    Patient is here to talk about lung nodule.     This is a 50 year old female presents for evaluation of a pulmonary nodule.  Additionally she has a history of PE when she was diagnosed with COVID in March 2021.  Review of the CT scan reveals a small perifissural nodule.  She also has a nonocclusive pulmonary embolism.  This is also small.  She was started on Eliquis.  She has been anticoagulated since March 2021 on Eliquis.Patient CT scan reveals a 6 mm right middle lobe subsolid.  Visual nodule.  She has not had any additional CT imaging since then.  Of note she did have a sleeve gastrectomy in 2015 and has done well with her weight loss.  Patient has no family history of blood clots.  No personal history of blood clots or malignancy.   Past Medical History:  Diagnosis Date   Acute pulmonary embolism ([place]) 06/26/2019   Arthritis    Left knee   Carpal tunnel syndrome of right wrist 02/10/2018   Essential hypertension 10/26/2016   Family history of early CAD    Gallstone 06/25/2018   Hypertension    Incidental lung nodule 06/26/2019   Menorrhagia 02/22/2015   Morbid obesity ([place]) 11/10/2012   BMI 63    Pain in right hand 01/27/2018   PONV (postoperative nausea and vomiting)    Right upper quadrant pain 06/25/2018   Steatosis of liver 06/25/2018     Family History  Problem Relation Age of Onset   Hyperlipidemia Father    Heart attack Father    Heart attack Other    Hypertension Other    Cancer Other    Stroke Other    Hypertension Mother    Parkinson's disease Mother      Past Surgical History:  Procedure Laterality Date   CARPAL TUNNEL RELEASE Right 04/04/2018   Procedure: CARPAL TUNNEL RELEASE;  Surgeon: Latanya Maudlin, MD;  Location: [facility];   Service: Orthopedics;  Laterality: Right;  12min   CHOLECYSTECTOMY     gastic sleeve  2015   HYSTEROSCOPY WITH NOVASURE N/A 03/29/2015   Procedure: HYSTEROSCOPY WITH NOVASURE;  Surgeon: Anastasio Auerbach, MD;  Location: [facility];  Service: Gynecology;  Laterality: N/A;  to follow first case around 10:15am.  Requests one hour OR time.   INTRAUTERINE DEVICE INSERTION     X 2  Spontaneously extruded X 2.   2014/2015   TUBAL LIGATION  approximately 2000   as an interval procedure historically    Social History   Socioeconomic History   Marital status: Married    Spouse name: Not on file   Number of children: Not on file   Years of education: Not on file   Highest education level: Not on file  Occupational History   Not on file  Tobacco Use   Smoking status: Former   Smokeless tobacco: Never  Vaping Use   Vaping Use: Never used  Substance and Sexual Activity   Alcohol use: Yes    Comment: occas    Drug use: No   Sexual activity: Yes    Birth control/protection: Surgical    Comment: BTL  Other Topics Concern   Not on file  Social History Narrative   Right  handed   Lives with friend, two story home   Social Determinants of Health   Financial Resource Strain: Not on file  Food Insecurity: Not on file  Transportation Needs: Not on file  Physical Activity: Not on file  Stress: Not on file  Social Connections: Not on file  Intimate Partner Violence: Not on file     Allergies  Allergen Reactions   Nsaids     History of gastric sleeve   Other Other (See Comments)    Patient stated that she can only take chewable and liquid medication Patient says she can now swallow solid pills/capsules/caplets 06/25/2018     Outpatient Medications Prior to Visit  Medication Sig Dispense Refill   albuterol (VENTOLIN HFA) 108 (90 Base) MCG/ACT inhaler TAKE 2 PUFFS BY MOUTH EVERY 6 HOURS AS NEEDED FOR WHEEZE OR SHORTNESS OF BREATH 18 g 1   amLODipine (NORVASC) 10 MG tablet Take 1 tablet  (10 mg total) by mouth daily. 30 tablet 0   apixaban (ELIQUIS) 5 MG TABS tablet Take 1 tablet (5 mg total) by mouth 2 (two) times daily. 180 tablet 0   butalbital-acetaminophen-caffeine (FIORICET) 50-325-40 MG tablet Take 50-235 tablets by mouth as needed.     Calcium Carbonate-Vitamin D (CALCIUM-VITAMIN D) 600-125 MG-UNIT TABS Take 1 tablet by mouth daily.     CREON 36000-114000 units CPEP capsule Take 1 capsule by mouth 3 (three) times daily with meals.     furosemide (LASIX) 40 MG tablet Take 1 tablet (40 mg total) by mouth once a week. Pt. Need to schedule an appt in order to receive future refills. Thank you. 2nd attempt. 2 tablet 0   pantoprazole (PROTONIX) 40 MG tablet TAKE 1 TABLET BY MOUTH EVERY DAY 90 tablet 2   potassium chloride (KLOR-CON) 10 MEQ tablet TAKE 1 TABLET EVERY MONDAY 2 tablet 0   rosuvastatin (CRESTOR) 10 MG tablet TAKE 1 TABLET BY MOUTH EVERY DAY 90 tablet 0   sucralfate (CARAFATE) 1 g tablet Take 1 tablet (1 g total) by mouth 4 (four) times daily -  with meals and at bedtime. 90 tablet 0   triamcinolone cream (KENALOG) 0.5 % Apply 1 application topically 2 (two) times daily as needed. 454 g 0   No facility-administered medications prior to visit.    Review of Systems  Constitutional:  Negative for chills, fever, malaise/fatigue and weight loss.  HENT:  Negative for hearing loss, sore throat and tinnitus.   Eyes:  Negative for blurred vision and double vision.  Respiratory:  Negative for cough, hemoptysis, sputum production, shortness of breath, wheezing and stridor.   Cardiovascular:  Negative for chest pain, palpitations, orthopnea, leg swelling and PND.  Gastrointestinal:  Negative for abdominal pain, constipation, diarrhea, heartburn, nausea and vomiting.  Genitourinary:  Negative for dysuria, hematuria and urgency.  Musculoskeletal:  Negative for joint pain and myalgias.  Skin:  Negative for itching and rash.  Neurological:  Negative for dizziness, tingling,  weakness and headaches.  Endo/Heme/Allergies:  Negative for environmental allergies. Does not bruise/bleed easily.  Psychiatric/Behavioral:  Negative for depression. The patient is not nervous/anxious and does not have insomnia.   All other systems reviewed and are negative.   Objective:  Physical Exam Vitals reviewed.  Constitutional:      General: She is not in acute distress.    Appearance: She is well-developed. She is obese.  HENT:     Head: Normocephalic and atraumatic.  Eyes:     General: No scleral icterus.  Conjunctiva/sclera: Conjunctivae normal.     Pupils: Pupils are equal, round, and reactive to light.  Neck:     Vascular: No JVD.     Trachea: No tracheal deviation.  Cardiovascular:     Rate and Rhythm: Normal rate and regular rhythm.     Heart sounds: Normal heart sounds. No murmur heard. Pulmonary:     Effort: Pulmonary effort is normal. No tachypnea, accessory muscle usage or respiratory distress.     Breath sounds: No stridor. No wheezing, rhonchi or rales.  Abdominal:     General: There is no distension.     Palpations: Abdomen is soft.     Tenderness: There is no abdominal tenderness.  Musculoskeletal:        General: No tenderness.     Cervical back: Neck supple.  Lymphadenopathy:     Cervical: No cervical adenopathy.  Skin:    General: Skin is warm and dry.     Capillary Refill: Capillary refill takes less than 2 seconds.     Findings: No rash.  Neurological:     Mental Status: She is alert and oriented to person, place, and time.  Psychiatric:        Behavior: Behavior normal.     Vitals:   04/05/21 1514  BP: 120/88  Pulse: 87  Temp: 97.7 F (36.5 C)  TempSrc: Oral  SpO2: 97%  Weight: 229 lb (103.9 kg)  Height: 5\' 2"  (1.575 m)   97% on RA BMI Readings from Last 3 Encounters:  04/05/21 41.88 kg/m  03/31/21 43.68 kg/m  06/13/20 47.74 kg/m   Wt Readings from Last 3 Encounters:  04/05/21 229 lb (103.9 kg)  03/31/21 238 lb 12.8  oz (108.3 kg)  06/13/20 261 lb (118.4 kg)     CBC    Component Value Date/Time   WBC 8.0 07/29/2019 1111   WBC 9.1 06/26/2019 0421   RBC 4.87 07/29/2019 1111   RBC 4.50 06/26/2019 0421   HGB 13.5 07/29/2019 1111   HCT 40.8 07/29/2019 1111   PLT 313 07/29/2019 1111   MCV 84 07/29/2019 1111   MCV 81 05/29/2013 0420   MCH 27.7 07/29/2019 1111   MCH 27.6 06/26/2019 0421   MCHC 33.1 07/29/2019 1111   MCHC 31.1 06/26/2019 0421   RDW 13.1 07/29/2019 1111   RDW 16.6 (H) 05/29/2013 0420   LYMPHSABS 1.5 07/29/2019 1111   LYMPHSABS 1.2 05/29/2013 0420   MONOABS 0.9 06/25/2019 2209   MONOABS 0.9 05/29/2013 0420   EOSABS 0.1 07/29/2019 1111   EOSABS 0.0 05/29/2013 0420   BASOSABS 0.0 07/29/2019 1111   BASOSABS 0.0 05/29/2013 0420    Chest Imaging: March 2021: CT chest 6 mm right middle lobe subsolid perifissural lung nodule. Peripheral nonocclusive pulmonary embolism. The patient's images have been independently reviewed by me.    Pulmonary Functions Testing Results: No flowsheet data found.  FeNO:   Pathology:   Echocardiogram:   Heart Catheterization:     Assessment & Plan:     ICD-10-CM   1. Lung nodule  R91.1 CT CHEST WO CONTRAST    2. Single subsegmental pulmonary embolism without acute cor pulmonale (HCC)  I26.93     3. On continuous oral anticoagulation  Z79.01     4. BMI 40.0-44.9, adult [facility])  Z68.41       Discussion:  This is a 50 year old female, provoked pulmonary embolism with a history of COVID-19 at the time of diagnosis.  Patient had lower extremity Dopplers with no evidence  of distal DVT.  Patient was placed on anticoagulation and has been on that for 21 months.  Plan: Today in the office we talked about the risk benefits of coming off of anticoagulation. I think it safe for her to come off of Eliquis and start an 81 mg aspirin regimen. She has not had any other additional reasons for clot, no family history of clot and she has a provoked  reason at the time of diagnosis of COVID-19. She does however have risk factor for the development of clot related to weight. We talked about the importance of her continued weight loss program and she is working on this. As for the lung nodule I think that she needs a repeat CT scan of the chest. If this document stability she is unlikely to need any additional follow-up for the nodule however we do need to check to see what this looks like since it has been several months. Patient is agreeable to this plan.  Orders placed.    Current Outpatient Medications:    albuterol (VENTOLIN HFA) 108 (90 Base) MCG/ACT inhaler, TAKE 2 PUFFS BY MOUTH EVERY 6 HOURS AS NEEDED FOR WHEEZE OR SHORTNESS OF BREATH, Disp: 18 g, Rfl: 1   amLODipine (NORVASC) 10 MG tablet, Take 1 tablet (10 mg total) by mouth daily., Disp: 30 tablet, Rfl: 0   apixaban (ELIQUIS) 5 MG TABS tablet, Take 1 tablet (5 mg total) by mouth 2 (two) times daily., Disp: 180 tablet, Rfl: 0   butalbital-acetaminophen-caffeine (FIORICET) 50-325-40 MG tablet, Take 50-235 tablets by mouth as needed., Disp: , Rfl:    Calcium Carbonate-Vitamin D (CALCIUM-VITAMIN D) 600-125 MG-UNIT TABS, Take 1 tablet by mouth daily., Disp: , Rfl:    CREON 36000-114000 units CPEP capsule, Take 1 capsule by mouth 3 (three) times daily with meals., Disp: , Rfl:    furosemide (LASIX) 40 MG tablet, Take 1 tablet (40 mg total) by mouth once a week. Pt. Need to schedule an appt in order to receive future refills. Thank you. 2nd attempt., Disp: 2 tablet, Rfl: 0   pantoprazole (PROTONIX) 40 MG tablet, TAKE 1 TABLET BY MOUTH EVERY DAY, Disp: 90 tablet, Rfl: 2   potassium chloride (KLOR-CON) 10 MEQ tablet, TAKE 1 TABLET EVERY MONDAY, Disp: 2 tablet, Rfl: 0   rosuvastatin (CRESTOR) 10 MG tablet, TAKE 1 TABLET BY MOUTH EVERY DAY, Disp: 90 tablet, Rfl: 0   sucralfate (CARAFATE) 1 g tablet, Take 1 tablet (1 g total) by mouth 4 (four) times daily -  with meals and at bedtime., Disp: 90  tablet, Rfl: 0   triamcinolone cream (KENALOG) 0.5 %, Apply 1 application topically 2 (two) times daily as needed., Disp: 454 g, Rfl: 0   Garner Nash, DO [facility] 04/05/2021 3:38 PM

## 2021-04-28 ENCOUNTER — Ambulatory Visit
Admission: RE | Admit: 2021-04-28 | Discharge: 2021-04-28 | Disposition: A | Discharge status: Home / Self Care | Payer: BC Managed Care – PPO | Source: Ambulatory Visit | Attending: Pulmonary Disease | Admitting: Pulmonary Disease

## 2021-04-28 DIAGNOSIS — R911 Solitary pulmonary nodule: Secondary | ICD-10-CM

## 2021-05-08 ENCOUNTER — Telehealth: Payer: Self-pay

## 2021-05-08 NOTE — Progress Notes (Signed)
Ronney Asters,  You can let patient know that lung nodules are stable.  No additional follow-up needed.  Thanks,  BLI  Garner Nash, DO [facility] 05/08/2021 11:12 AM

## 2021-05-08 NOTE — Telephone Encounter (Signed)
-----   Message from Garner Nash, DO sent at 05/08/2021 11:12 AM EST ----- Darci Needle can let patient know that lung nodules are stable.  No additional follow-up needed.  Thanks,  BLI  Garner Nash, DO [facility] 05/08/2021 11:12 AM

## 2021-05-08 NOTE — Telephone Encounter (Signed)
Attempted to call patient   Fran Lowes, CMA

## 2021-06-01 DIAGNOSIS — Z713 Dietary counseling and surveillance: Secondary | ICD-10-CM | POA: Diagnosis not present

## 2021-06-01 DIAGNOSIS — Z13220 Encounter for screening for lipoid disorders: Secondary | ICD-10-CM | POA: Diagnosis not present

## 2021-06-01 DIAGNOSIS — Z6841 Body Mass Index (BMI) 40.0 and over, adult: Secondary | ICD-10-CM | POA: Diagnosis not present

## 2021-06-01 DIAGNOSIS — Z136 Encounter for screening for cardiovascular disorders: Secondary | ICD-10-CM | POA: Diagnosis not present

## 2021-06-30 ENCOUNTER — Telehealth: Payer: Self-pay | Admitting: Pulmonary Disease

## 2021-06-30 NOTE — Telephone Encounter (Signed)
Lm for patient.  

## 2021-07-03 NOTE — Telephone Encounter (Signed)
Karen [name], [place] ? ?Note ?----- Message from Garner Nash, DO sent at 05/08/2021 11:12 AM EST ----- ?Tyasia, ?  ?You can let patient know that lung nodules are stable.  No additional follow-up needed. ?  ?Thanks, ?  ?BLI ?  ?Garner Nash, DO ?[facility] ?05/08/2021 11:12 AM  ?   ?  ? ?Patient is aware of results and voiced her understanding.  ?She is wanting to know if she needs to continue Eliquis '5mg'$ ? ? ?Dr. Valeta Harms, please advise. Thanks ?

## 2021-07-03 NOTE — Telephone Encounter (Signed)
Patient is returning phone call Patient phone number is [phone]. ?

## 2021-07-04 NOTE — Progress Notes (Signed)
? ?  Karen [name] 09-30-1970 [id number] ? ? ?History: Postmenopausal 51 y.o. G2P2 presents for annual exam. Ablation 2016. Noticed vaginal discharge last week.  Used 2 boric acid suppositories and symptoms resolved.  ?Struggling from long haul covid, had PE last year. Slowly improving. Has lost >100lbs since. ? ? ?Gynecologic History ?Postmenopausal ?Last Pap: 2016. Results were: normal ?Last mammogram: 2021. Results were: normal ?Last colonoscopy: never ?HRT use: no ?DEXA: never ? ?Obstetric History ?OB History  ?Gravida Para Term Preterm AB Living  ?'2 2       2  '$ ?SAB IAB Ectopic Multiple Live Births  ?           ?  ?# Outcome Date GA Lbr Len/2nd Weight Sex Delivery Anes PTL Lv  ?2 Para           ?1 Para           ? ? ? ?The following portions of the patient's history were reviewed and updated as appropriate: allergies, current medications, past family history, past medical history, past social history, past surgical history, and problem list. ? ?Review of Systems ?Pertinent items noted in HPI and remainder of comprehensive ROS otherwise negative.  ?Past medical history, past surgical history, family history and social history were all reviewed and documented in the EPIC chart. ? ?Exam: ? ?Vitals:  ? 07/05/21 1058  ?BP: 124/82  ?Weight: 236 lb (107 kg)  ?Height: '5\' 2"'$  (1.575 m)  ? ?Body mass index is 43.16 kg/m?. ? ?General appearance:  NAD, obese ?Thyroid:  Symmetrical, normal in size, without palpable masses or nodularity. ?Respiratory ? Auscultation:  Clear without wheezing or rhonchi ?Cardiovascular ? Auscultation:  Regular rate, without rubs, murmurs or gallops ? Edema/varicosities:  Not grossly evident ?Abdominal ? Soft,nontender, without masses, guarding or rebound. ? Liver/spleen:  No organomegaly noted ? Hernia:  None appreciated ? Skin ? Inspection:  Grossly normal, extra skin from weight loss ?Breasts: Examined lying and sitting.  ? Right: Without masses, retractions, nipple discharge or axillary  adenopathy. ? ? Left: Without masses, retractions, nipple discharge or axillary adenopathy. ?Genitourinary  ? Inguinal/mons:  Normal without inguinal adenopathy ? External genitalia:  Normal appearing vulva with no masses, tenderness, or lesions ? BUS/Urethra/Skene's glands:  Normal ? Vagina:  Normal appearing with normal color and discharge, no lesions.  ? Cervix:  Normal appearing without discharge or lesions ? Uterus:  Normal in size, shape and contour.  Midline and mobile, nontender ? Adnexa/parametria:   ?  Rt: Normal in size, without masses or tenderness. ?  Lt: Normal in size, without masses or tenderness. ? Anus and perineum: Normal ?  ? ?Patient informed chaperone available to be present for breast and pelvic exam. Patient has requested no chaperone to be present. Patient has been advised what will be completed during breast and pelvic exam.  ? ?Assessment/Plan:   ?1. Well woman exam with routine gynecological exam ?Pap with cotesting ?Schedule mammo ?- Cytology - PAP( [place]) ? ?2. Screening for colon cancer ? ?- Cologuard  ? ?Discussed SBE, colonoscopy and DEXA screening as directed. Recommend 153mns of exercise weekly, including weight bearing exercise. Encouraged the use of seatbelts and sunscreen.  ?Return in 1 year for annual or sooner prn. ? ?CKerry DoryWHNP-BC, 11:20 AM 07/05/2021  ?

## 2021-07-05 ENCOUNTER — Other Ambulatory Visit: Payer: Self-pay

## 2021-07-05 ENCOUNTER — Ambulatory Visit (INDEPENDENT_AMBULATORY_CARE_PROVIDER_SITE_OTHER): Payer: BC Managed Care – PPO | Admitting: Radiology

## 2021-07-05 ENCOUNTER — Other Ambulatory Visit (HOSPITAL_COMMUNITY)
Admission: RE | Admit: 2021-07-05 | Discharge: 2021-07-05 | Disposition: A | Discharge status: Home / Self Care | Payer: BC Managed Care – PPO | Source: Ambulatory Visit | Attending: Radiology | Admitting: Radiology

## 2021-07-05 ENCOUNTER — Encounter: Payer: Self-pay | Admitting: Radiology

## 2021-07-05 VITALS — BP 124/82 | Ht 62.0 in | Wt 236.0 lb

## 2021-07-05 DIAGNOSIS — Z01419 Encounter for gynecological examination (general) (routine) without abnormal findings: Secondary | ICD-10-CM | POA: Insufficient documentation

## 2021-07-05 DIAGNOSIS — Z1211 Encounter for screening for malignant neoplasm of colon: Secondary | ICD-10-CM

## 2021-07-07 ENCOUNTER — Other Ambulatory Visit: Payer: Self-pay | Admitting: Nurse Practitioner

## 2021-07-07 DIAGNOSIS — I2699 Other pulmonary embolism without acute cor pulmonale: Secondary | ICD-10-CM

## 2021-07-07 LAB — CYTOLOGY - PAP
Comment: NEGATIVE
Diagnosis: NEGATIVE
High risk HPV: NEGATIVE

## 2021-07-10 ENCOUNTER — Other Ambulatory Visit: Payer: Self-pay

## 2021-07-10 DIAGNOSIS — A599 Trichomoniasis, unspecified: Secondary | ICD-10-CM

## 2021-07-10 MED ORDER — METRONIDAZOLE 500 MG PO TABS
500.0000 mg | ORAL_TABLET | Freq: Two times a day (BID) | ORAL | 0 refills | Status: AC
Start: 2021-07-10 — End: 2021-07-17

## 2021-07-10 NOTE — Telephone Encounter (Signed)
Pap showed trichomoniasis, not yeast. Please treat with Metronidazole '500mg'$  po BID x 7 days. Partner needs to be seen and treated as well. Abstain from intercourse until 10 days after treatment completed for BOTH. TOC 6 weeks.

## 2021-07-11 ENCOUNTER — Other Ambulatory Visit: Payer: Self-pay | Admitting: Family Medicine

## 2021-07-11 DIAGNOSIS — I2699 Other pulmonary embolism without acute cor pulmonale: Secondary | ICD-10-CM

## 2021-07-11 NOTE — Telephone Encounter (Signed)
Requested medication (s) are due for refill today: Yes ? ?Requested medication (s) are on the active medication list: Yes ? ?Last refill:  03/21/21 ? ?Future visit scheduled: No ? ?Notes to clinic:  Unable to refill per protocol due to failed labs, no updated results. Patient going out of town and requesting refill  ? ? ? ? ?Requested Prescriptions  ?Pending Prescriptions Disp Refills  ? apixaban (ELIQUIS) 5 MG TABS tablet 180 tablet 0  ?  Sig: Take 1 tablet (5 mg total) by mouth 2 (two) times daily.  ?  ? Hematology:  Anticoagulants - apixaban Failed - 07/11/2021  4:21 PM  ?  ?  Failed - PLT in normal range and within 360 days  ?  Platelets  ?Date Value Ref Range Status  ?07/29/2019 313 150 - 450 x10E3/uL Final  ?  ?  ?  ?  Failed - HGB in normal range and within 360 days  ?  Hemoglobin  ?Date Value Ref Range Status  ?07/29/2019 13.5 11.1 - 15.9 g/dL Final  ?  ?  ?  ?  Failed - HCT in normal range and within 360 days  ?  Hematocrit  ?Date Value Ref Range Status  ?07/29/2019 40.8 34.0 - 46.6 % Final  ?  ?  ?  ?  Failed - Cr in normal range and within 360 days  ?  Creatinine  ?Date Value Ref Range Status  ?05/29/2013 0.81 0.60 - 1.30 mg/dL Final  ? ?Creatinine, Ser  ?Date Value Ref Range Status  ?07/29/2019 0.69 0.57 - 1.00 mg/dL Final  ?  ?  ?  ?  Failed - AST in normal range and within 360 days  ?  AST  ?Date Value Ref Range Status  ?07/29/2019 18 0 - 40 IU/L Final  ? ?SGOT(AST)  ?Date Value Ref Range Status  ?04/13/2013 47 (H) 15 - 37 Unit/L Final  ?  ?  ?  ?  Failed - ALT in normal range and within 360 days  ?  ALT  ?Date Value Ref Range Status  ?07/29/2019 14 0 - 32 IU/L Final  ? ?SGPT (ALT)  ?Date Value Ref Range Status  ?04/13/2013 54 12 - 78 U/L Final  ?  ?  ?  ?  Passed - Valid encounter within last 12 months  ?  Recent Outpatient Visits   ? ?      ? 3 months ago Essential hypertension  ? Primary Care at [facility], MD  ? 7 months ago Lung nodule  ? Primary Care at [facility],  Kriste Basque, NP  ? 1 year ago Encounter for completion of form with patient  ? Primary Care at [facility], Bayard Beaver, MD  ? 1 year ago Neck mass  ? Primary Care at [facility], FNP  ? 1 year ago Return to work evaluation  ? Primary Care at [facility], Bayard Beaver, MD  ? ?  ?  ? ?  ?  ?  ? ? ? ? ?

## 2021-07-11 NOTE — Telephone Encounter (Signed)
Pt is out of medication, is going on cruise  April 6 is asking for short supply if not in before she leaves/Medication Refill - Medication:apixaban (ELIQUIS) 5 MG TABS tablet  ? ?Has the patient contacted their pharmacy? yes ?(Agent: If no, request that the patient contact the pharmacy for the refill. If patient does not wish to contact the pharmacy document the reason why and proceed with request.) ?(Agent: If yes, when and what did the pharmacy advise?)contact pcp ? ?Preferred Pharmacy (with phone number or street name):  ?[id number] [place] Phone:  [phone] ?Fax:  [phone] ?  ? ?Has the patient been seen for an appointment in the last year OR does the patient have an upcoming appointment? yes ? ?Agent: Please be advised that RX refills may take up to 3 business days. We ask that you follow-up with your pharmacy.  ?

## 2021-07-12 ENCOUNTER — Telehealth: Payer: Self-pay | Admitting: Family Medicine

## 2021-07-12 NOTE — Telephone Encounter (Unsigned)
Copied from [place] [phone]. Topic: General - Other ?>> Jul 12, 2021  3:05 PM Tessa Lerner A wrote: ?Reason for CRM: The patient has called to check on the status of their apixaban (ELIQUIS) 5 MG TABS tablet [[id number]] refill request  ? ?The patient will be traveling and is leaving tomorrow morning 07/13/21 at 6 AM ? ?Please contact further when possible ?

## 2021-07-12 NOTE — Telephone Encounter (Signed)
Already routed to provider the refill request in another encounter on 07/11/21, nurse triage unable to refill due to failed labs. Will route this request to provider. ?

## 2021-07-19 ENCOUNTER — Other Ambulatory Visit: Payer: Self-pay | Admitting: Family Medicine

## 2021-07-20 ENCOUNTER — Encounter: Payer: Self-pay | Admitting: *Deleted

## 2021-07-24 DIAGNOSIS — Z20822 Contact with and (suspected) exposure to covid-19: Secondary | ICD-10-CM | POA: Diagnosis not present

## 2021-07-24 DIAGNOSIS — Z03818 Encounter for observation for suspected exposure to other biological agents ruled out: Secondary | ICD-10-CM | POA: Diagnosis not present

## 2021-07-25 NOTE — Telephone Encounter (Signed)
I have sent a msg to provider. ?

## 2021-07-26 NOTE — Plan of Care (Signed)
Patient was called today to inform her of msg that was sent from Dr. Valeta Harms. Patient was very pleased to learn of information. Patient will  be picking up her ASA 81 mg ?Melene Plan ? ?

## 2021-07-26 NOTE — Telephone Encounter (Signed)
-----   Message from Garner Nash, DO sent at 07/25/2021  2:28 PM EDT ----- ?Regarding: RE: medication reill ?Its up to the patient. But 3 to 6 months of AC and transition to '81mg'$  ASA daily is fine for first time PE.  ? ?Thanks ? ?BLI  ?  ? ?----- Message ----- ?From: Melene Plan, RMA ?Sent: 07/25/2021   1:35 PM EDT ?To: Garner Nash, DO ?Subject: medication reill                              ? ?Hey, I'm  reaching out to you for this patient. We just need to know if this patient should be on the medication Eliquis. Patient seem to think she should be on this medication for her duration. Please clarify. ? ?Thanks, Luellen Pucker ? ? ?

## 2021-07-28 ENCOUNTER — Ambulatory Visit: Payer: Self-pay | Admitting: Radiology

## 2021-08-18 ENCOUNTER — Ambulatory Visit: Payer: BC Managed Care – PPO | Admitting: Radiology

## 2021-09-18 ENCOUNTER — Encounter: Payer: BC Managed Care – PPO | Admitting: Family Medicine

## 2021-09-29 ENCOUNTER — Telehealth: Payer: Self-pay | Admitting: Family Medicine

## 2021-09-29 NOTE — Telephone Encounter (Signed)
Patient given appt 

## 2021-10-03 ENCOUNTER — Encounter: Payer: Self-pay | Admitting: Family Medicine

## 2021-10-03 ENCOUNTER — Ambulatory Visit: Payer: BC Managed Care – PPO | Admitting: Family Medicine

## 2021-10-03 VITALS — BP 114/81 | HR 94 | Temp 98.1°F | Resp 16 | Wt 234.4 lb

## 2021-10-03 DIAGNOSIS — Z0289 Encounter for other administrative examinations: Secondary | ICD-10-CM

## 2021-10-03 DIAGNOSIS — E785 Hyperlipidemia, unspecified: Secondary | ICD-10-CM

## 2021-10-03 DIAGNOSIS — I10 Essential (primary) hypertension: Secondary | ICD-10-CM

## 2021-10-03 DIAGNOSIS — F419 Anxiety disorder, unspecified: Secondary | ICD-10-CM

## 2021-10-03 DIAGNOSIS — U099 Post covid-19 condition, unspecified: Secondary | ICD-10-CM

## 2021-10-03 MED ORDER — MIRTAZAPINE 15 MG PO TABS: 15.0000 mg | ORAL_TABLET | Freq: Every day | ORAL | 1 refills | Status: DC

## 2021-10-03 MED ORDER — ALBUTEROL SULFATE HFA 108 (90 BASE) MCG/ACT IN AERS: INHALATION_SPRAY | RESPIRATORY_TRACT | 1 refills | Status: DC

## 2021-10-03 MED ORDER — POTASSIUM CHLORIDE ER 10 MEQ PO TBCR: EXTENDED_RELEASE_TABLET | ORAL | 0 refills | Status: AC

## 2021-10-03 MED ORDER — AMLODIPINE BESYLATE 10 MG PO TABS: 10.0000 mg | ORAL_TABLET | Freq: Every day | ORAL | 0 refills | Status: DC

## 2021-10-03 MED ORDER — TRIAMCINOLONE ACETONIDE 0.5 % EX CREA: 1.0000 | TOPICAL_CREAM | Freq: Two times a day (BID) | CUTANEOUS | 0 refills | Status: DC | PRN

## 2021-10-03 MED ORDER — FUROSEMIDE 40 MG PO TABS: 40.0000 mg | ORAL_TABLET | ORAL | 0 refills | Status: AC

## 2021-10-03 NOTE — Progress Notes (Signed)
Patient came in to get papers filled out for her employment. Patient request refills

## 2021-10-04 ENCOUNTER — Telehealth: Payer: Self-pay | Admitting: *Deleted

## 2021-10-04 NOTE — Telephone Encounter (Signed)
Patient was identified Patient was called and informed paperwork was filled ou and faxed and she can pick up copy

## 2021-10-05 ENCOUNTER — Encounter: Payer: Self-pay | Admitting: Family Medicine

## 2021-10-05 NOTE — Progress Notes (Signed)
Established Patient Office Visit  Subjective    Patient ID: Karen [name], female    DOB: Feb 22, 1971  Age: 50 y.o. MRN: [id number]  CC:  Chief Complaint  Patient presents with   papers    HPI Karen [name] presents for follow up of chronic med issues including anxiety and hypertension. Patient also requests completion of FMLA forms to continue to work at home 2/2 persistent anxiety post-covid.    Outpatient Encounter Medications as of 10/03/2021  Medication Sig   butalbital-acetaminophen-caffeine (FIORICET) 50-325-40 MG tablet Take 50-235 tablets by mouth as needed.   Calcium Carbonate-Vitamin D (CALCIUM-VITAMIN D) 600-125 MG-UNIT TABS Take 1 tablet by mouth daily.   CREON 36000-114000 units CPEP capsule Take 1 capsule by mouth 3 (three) times daily with meals.   gabapentin (NEURONTIN) 300 MG capsule START WITH 1 CAPSULE AT BEDTIME X 3 DAYS THEN 2 CAPS AT BEDTIME EVERY DAY   mirtazapine (REMERON) 15 MG tablet Take 1 tablet (15 mg total) by mouth at bedtime.   Naltrexone-buPROPion HCl ER (CONTRAVE) 8-90 MG TB12 1TAB IN AM X1WK, 1 TAB TWICE DAILY X1WK, 2TABS IN AM 1 IN PM X1WK, 2 TABS TWICE DAILY   pantoprazole (PROTONIX) 40 MG tablet TAKE 1 TABLET BY MOUTH EVERY DAY   rosuvastatin (CRESTOR) 10 MG tablet TAKE 1 TABLET BY MOUTH EVERY DAY   sucralfate (CARAFATE) 1 g tablet Take 1 tablet (1 g total) by mouth 4 (four) times daily -  with meals and at bedtime.   [DISCONTINUED] amLODipine (NORVASC) 10 MG tablet Take 1 tablet (10 mg total) by mouth daily.   [DISCONTINUED] furosemide (LASIX) 40 MG tablet Take 1 tablet (40 mg total) by mouth once a week. Pt. Need to schedule an appt in order to receive future refills. Thank you. 2nd attempt.   [DISCONTINUED] potassium chloride (KLOR-CON) 10 MEQ tablet TAKE 1 TABLET EVERY MONDAY   [DISCONTINUED] triamcinolone cream (KENALOG) 0.5 % Apply 1 application topically 2 (two) times daily as needed.   albuterol (VENTOLIN HFA) 108 (90 Base)  MCG/ACT inhaler TAKE 2 PUFFS BY MOUTH EVERY 6 HOURS AS NEEDED FOR WHEEZE OR SHORTNESS OF BREATH   amLODipine (NORVASC) 10 MG tablet Take 1 tablet (10 mg total) by mouth daily.   apixaban (ELIQUIS) 5 MG TABS tablet Take 1 tablet (5 mg total) by mouth 2 (two) times daily. (Patient not taking: Reported on 07/05/2021)   furosemide (LASIX) 40 MG tablet Take 1 tablet (40 mg total) by mouth once a week. Pt. Need to schedule an appt in order to receive future refills. Thank you. 2nd attempt.   megestrol (MEGACE) 40 MG tablet TAKE 1 TABLET (40 MG TOTAL) BY MOUTH 2 (TWO) TIMES DAILY. UNTIL BLEEDING STOPS   potassium chloride (KLOR-CON) 10 MEQ tablet TAKE 1 TABLET EVERY MONDAY   traMADol (ULTRAM) 50 MG tablet TAKE 1 TABLET (50 MG TOTAL) BY MOUTH EVERY 6 (SIX) HOURS AS NEEDED FOR UP TO 5 DAYS.   triamcinolone cream (KENALOG) 0.5 % Apply 1 Application topically 2 (two) times daily as needed.   [DISCONTINUED] amLODipine (NORVASC) 5 MG tablet    [DISCONTINUED] medroxyPROGESTERone Acetate 150 MG/ML SUSY INJECT 1 ML (150 MG TOTAL) INTO THE MUSCLE ONCE.   [DISCONTINUED] pantoprazole (PROTONIX) 40 MG tablet Take 1 tablet by mouth daily.   No facility-administered encounter medications on file as of 10/03/2021.    Past Medical History:  Diagnosis Date   Acute pulmonary embolism ([place]) 06/26/2019   Arthritis    Left knee  Carpal tunnel syndrome of right wrist 02/10/2018   Essential hypertension 10/26/2016   Family history of early CAD    Gallstone 06/25/2018   Hypertension    Incidental lung nodule 06/26/2019   Menorrhagia 02/22/2015   Morbid obesity ([place]) 11/10/2012   BMI 63    Pain in right hand 01/27/2018   PONV (postoperative nausea and vomiting)    Right upper quadrant pain 06/25/2018   Steatosis of liver 06/25/2018    Past Surgical History:  Procedure Laterality Date   CARPAL TUNNEL RELEASE Right 04/04/2018   Procedure: CARPAL TUNNEL RELEASE;  Surgeon: Latanya Maudlin, MD;  Location: [facility];  Service:  Orthopedics;  Laterality: Right;  19mn   CHOLECYSTECTOMY     gastic sleeve  2015   HYSTEROSCOPY WITH NOVASURE N/A 03/29/2015   Procedure: HYSTEROSCOPY WITH NOVASURE;  Surgeon: TAnastasio Auerbach MD;  [facility];  Service: Gynecology;  Laterality: N/A;  to follow first case around 10:15am.  Requests one hour OR time.   INTRAUTERINE DEVICE INSERTION     X 2  Spontaneously extruded X 2.   2014/2015   TUBAL LIGATION  approximately 2000   as an interval procedure historically    Family History  Problem Relation Age of Onset   Hyperlipidemia Father    Heart attack Father    Heart attack Other    Hypertension Other    Cancer Other    Stroke Other    Hypertension Mother    Parkinson's disease Mother     Social History   Socioeconomic History   Marital status: Married    Spouse name: Not on file   Number of children: Not on file   Years of education: Not on file   Highest education level: Not on file  Occupational History   Not on file  Tobacco Use   Smoking status: Former   Smokeless tobacco: Never  Vaping Use   Vaping Use: Never used  Substance and Sexual Activity   Alcohol use: Yes    Comment: occas    Drug use: No   Sexual activity: Yes    Partners: Male    Birth control/protection: Surgical    Comment: BTL  Other Topics Concern   Not on file  Social History Narrative   Right handed   Lives with friend, two story home   Social Determinants of Health   Financial Resource Strain: Not on file  Food Insecurity: Not on file  Transportation Needs: Not on file  Physical Activity: Not on file  Stress: Not on file  Social Connections: Not on file  Intimate Partner Violence: Not on file    Review of Systems  Psychiatric/Behavioral:  Negative for substance abuse and suicidal ideas. The patient is nervous/anxious and has insomnia.   All other systems reviewed and are negative.       Objective    BP 114/81   Pulse 94   Temp 98.1 F (36.7 C) (Oral)    Resp 16   Wt 234 lb 6.4 oz (106.3 kg)   SpO2 94%   BMI 42.87 kg/m   Physical Exam Vitals and nursing note reviewed.  Constitutional:      General: She is not in acute distress.    Appearance: She is obese.  Cardiovascular:     Rate and Rhythm: Normal rate and regular rhythm.  Pulmonary:     Effort: Pulmonary effort is normal.     Breath sounds: Normal breath sounds.  Abdominal:  Palpations: Abdomen is soft.     Tenderness: There is no abdominal tenderness.  Neurological:     General: No focal deficit present.     Mental Status: She is alert and oriented to person, place, and time.  Psychiatric:        Mood and Affect: Affect normal. Mood is anxious.        Speech: Speech normal.        Behavior: Behavior normal. Behavior is cooperative.         Assessment & Plan:   1. COVID-19 long hauler manifesting chronic anxiety Patient with anxiety. Patient to schedule with [facility] for counseling and possible med management. Remeron 15 mg daily prescribed. Will monitor  2. Encounter for completion of form with patient FMLA forms completed  3. Essential hypertension Appears stable with present management. Continue and monitor. Meds refilled.  - amLODipine (NORVASC) 10 MG tablet; Take 1 tablet (10 mg total) by mouth daily.  Dispense: 30 tablet; Refill: 0  4. Hyperlipidemia, unspecified hyperlipidemia type Continue and monitor    Return in about 4 weeks (around 10/31/2021) for follow up.   Becky Sax, MD

## 2021-10-06 ENCOUNTER — Telehealth: Payer: Self-pay | Admitting: Family Medicine

## 2021-10-06 NOTE — Telephone Encounter (Signed)
Copied from [place] [phone]. 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

## 2021-10-06 NOTE — Telephone Encounter (Signed)
Please advise . BOA need more answers

## 2021-10-12 ENCOUNTER — Telehealth: Payer: Self-pay | Admitting: Family Medicine

## 2021-10-12 NOTE — Telephone Encounter (Signed)
Patient was informed that provider  has already spoken to medical /work people

## 2021-10-12 NOTE — Telephone Encounter (Signed)
Copied from [place] [phone]. Topic: General - Other >> Oct 12, 2021  9:57 AM [place] B wrote: Reason for CRM: Patient called in states needs to have medical accomodation papers resubmitted. Pleaes call back

## 2021-10-12 NOTE — Telephone Encounter (Signed)
Spoke with the representative regarding her FMLA paperwork and needing clarification for what accommodations may be necessary for patient to continue to work at home for the next year. 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

## 2021-10-25 ENCOUNTER — Encounter: Payer: BC Managed Care – PPO | Admitting: Family Medicine

## 2021-10-28 ENCOUNTER — Other Ambulatory Visit: Payer: Self-pay | Admitting: Family Medicine

## 2021-10-28 DIAGNOSIS — I10 Essential (primary) hypertension: Secondary | ICD-10-CM

## 2021-10-30 NOTE — Telephone Encounter (Signed)
Requested Prescriptions  Pending Prescriptions Disp Refills  . amLODipine (NORVASC) 10 MG tablet [Pharmacy Med Name: AMLODIPINE BESYLATE 10 MG TAB] 90 tablet 0    Sig: TAKE 1 TABLET BY MOUTH EVERY DAY     Cardiovascular: Calcium Channel Blockers 2 Passed - 10/28/2021  9:30 AM      Passed - Last BP in normal range    BP Readings from Last 1 Encounters:  10/03/21 114/81         Passed - Last Heart Rate in normal range    Pulse Readings from Last 1 Encounters:  10/03/21 94         Passed - Valid encounter within last 6 months    Recent Outpatient Visits          3 weeks ago COVID-19 long hauler manifesting chronic anxiety   Primary Care at [facility], MD   7 months ago Essential hypertension   Primary Care at [facility], MD   10 months ago Lung nodule   Primary Care at [facility], Kriste Basque, NP   1 year ago Encounter for completion of form with patient   Primary Care at [facility], Bayard Beaver, MD   1 year ago Neck mass   Primary Care at [facility], Carroll Sage, FNP

## 2021-10-30 NOTE — Telephone Encounter (Signed)
Requested medication (s) are due for refill today: Yes  Requested medication (s) are on the active medication list: Yes  Last refill:  10/03/21  Future visit scheduled: No  Notes to clinic:  Unable to refill per protocol, unsure if provider wants to give 90 day supply since 30 day given initially and no f/u scheduled.      Requested Prescriptions  Pending Prescriptions Disp Refills   mirtazapine (REMERON) 15 MG tablet [Pharmacy Med Name: MIRTAZAPINE 15 MG TABLET] 90 tablet 1    Sig: TAKE 1 TABLET BY MOUTH EVERYDAY AT BEDTIME     Psychiatry: Antidepressants - mirtazapine Passed - 10/28/2021  1:30 PM      Passed - Completed PHQ-2 or PHQ-9 in the last 360 days      Passed - Valid encounter within last 6 months    Recent Outpatient Visits           3 weeks ago COVID-19 long hauler manifesting chronic anxiety   Primary Care at [facility], MD   7 months ago Essential hypertension   Primary Care at [facility], MD   10 months ago Lung nodule   Primary Care at [facility], Kriste Basque, NP   1 year ago Encounter for completion of form with patient   Primary Care at [facility], Bayard Beaver, MD   1 year ago Neck mass   Primary Care at [facility], Carroll Sage, FNP

## 2021-10-31 NOTE — Telephone Encounter (Signed)
error 

## 2021-11-21 ENCOUNTER — Other Ambulatory Visit: Payer: Self-pay | Admitting: Family Medicine

## 2021-11-21 NOTE — Telephone Encounter (Signed)
Requested Prescriptions  Pending Prescriptions Disp Refills  . albuterol (VENTOLIN HFA) 108 (90 Base) MCG/ACT inhaler [Pharmacy Med Name: ALBUTEROL HFA (PROAIR) INHALER] 8.5 each 0    Sig: TAKE 2 PUFFS BY MOUTH EVERY 6 HOURS AS NEEDED FOR WHEEZE OR SHORTNESS OF BREATH     Pulmonology:  Beta Agonists 2 Passed - 11/21/2021  2:30 AM      Passed - Last BP in normal range    BP Readings from Last 1 Encounters:  10/03/21 114/81         Passed - Last Heart Rate in normal range    Pulse Readings from Last 1 Encounters:  10/03/21 94         Passed - Valid encounter within last 12 months    Recent Outpatient Visits          1 month ago COVID-19 long hauler manifesting chronic anxiety   Primary Care at [facility], MD   7 months ago Essential hypertension   Primary Care at [facility], MD   11 months ago Lung nodule   Primary Care at [facility], Kriste Basque, NP   1 year ago Encounter for completion of form with patient   Primary Care at [facility], Bayard Beaver, MD   1 year ago Neck mass   Primary Care at [facility], Carroll Sage, FNP

## 2021-11-23 ENCOUNTER — Telehealth: Payer: Self-pay

## 2021-11-23 NOTE — Telephone Encounter (Signed)
Left message in voice mail to call and follow up regarding past due Cologard order.

## 2021-11-23 NOTE — Telephone Encounter (Signed)
-----   Message from Kerry Dory, NP sent at 11/21/2021  9:43 AM EDT ----- Please follow up with patient. Has not completed Cologuard since ordered. Thanks. ----- Message ----- From: SYSTEM Sent: 10/03/2021  12:14 AM EDT To: Kerry Dory, NP

## 2021-12-01 IMAGING — CT CT ANGIO CHEST
2 of 7 series · 17 of 46 positions shown · IV contrast (OMNI)
Comparison: None.  CT abdomen dated July 05, 2013.

CLINICAL DATA: Dyspnea. Dual 5OL2I-0X diagnoses in February 2019
and May 2019.

EXAM:
CT ANGIOGRAPHY CHEST WITH CONTRAST
TECHNIQUE: Multidetector CT imaging of the chest was performed using the
standard protocol during bolus administration of intravenous
contrast. Multiplanar CT image reconstructions and MIPs were
obtained to evaluate the vascular anatomy. Automatic exposure
control utilized.
CONTRAST:  100mL OMNIPAQUE IOHEXOL 350 MG/ML SOLN

[Series 6: thins · axial · 0.98mm/px · z∈[+1257,+1524]mm · 14 of 299 slices shown]
[im 16/299  lung]
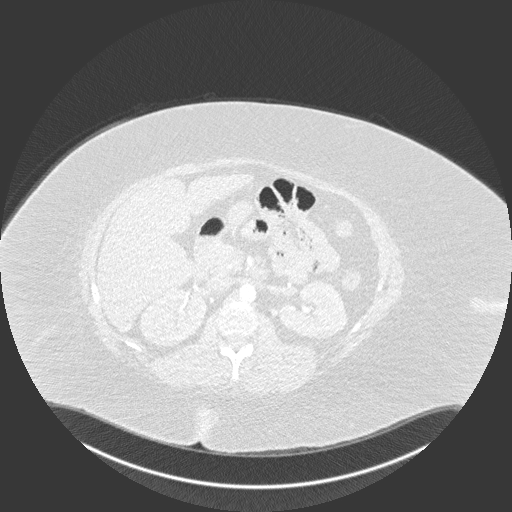
[im 32/299  soft-tissue]
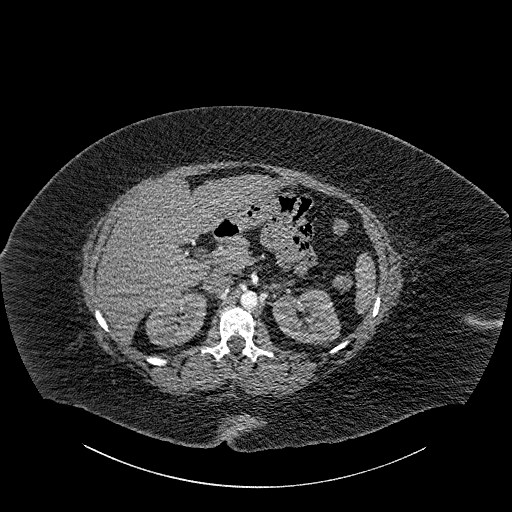
[im 63/299  lung]
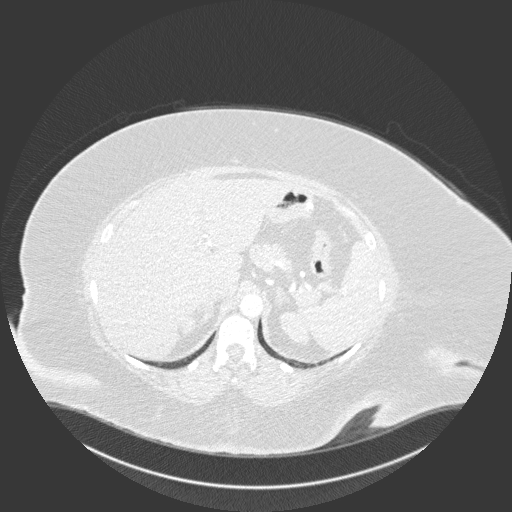
[im 79/299  soft-tissue]
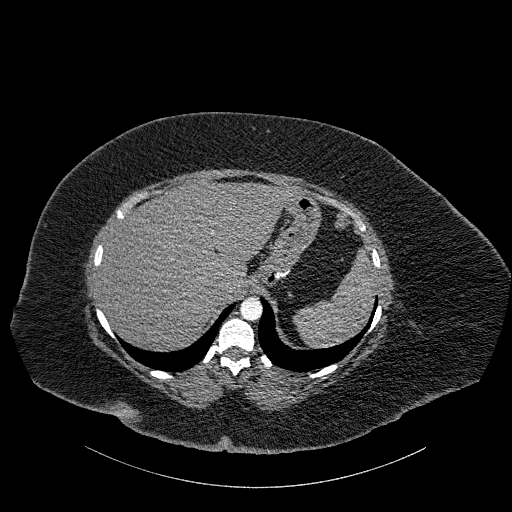
[im 95/299  lung]
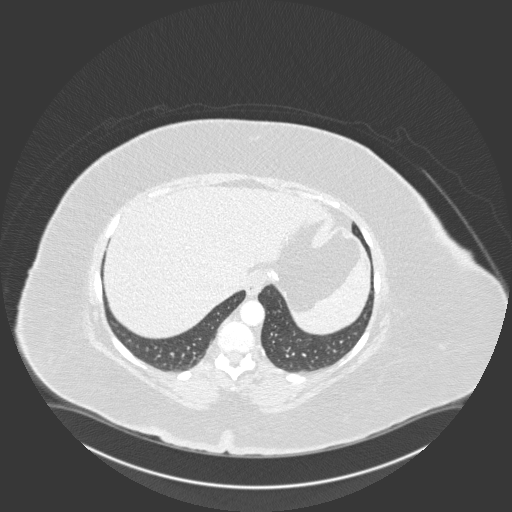
[im 126/299  soft-tissue]
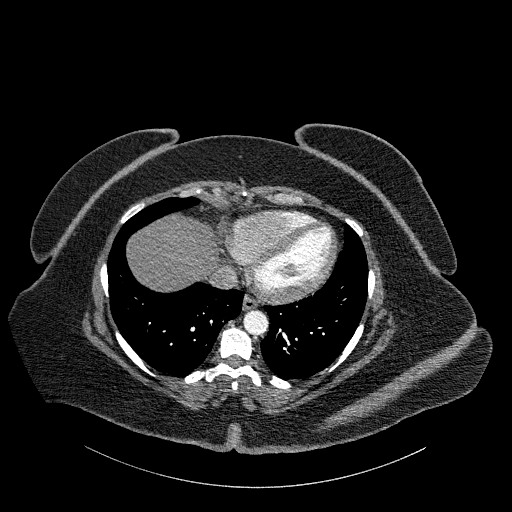
[im 142/299  lung]
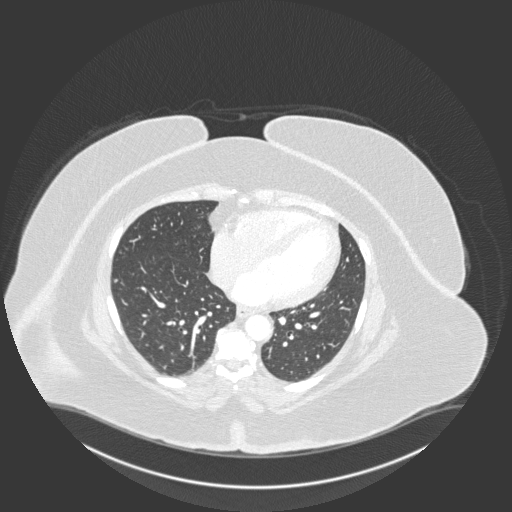
[im 157/299  soft-tissue]
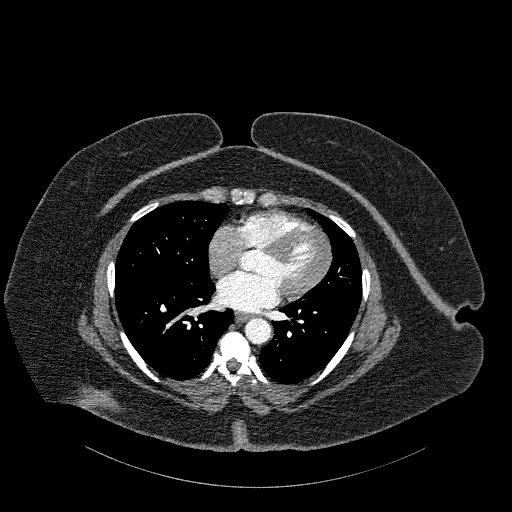
[im 173/299  lung]
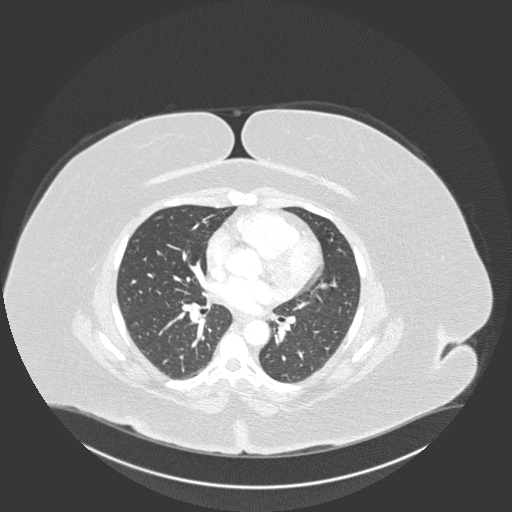
[im 204/299  soft-tissue]
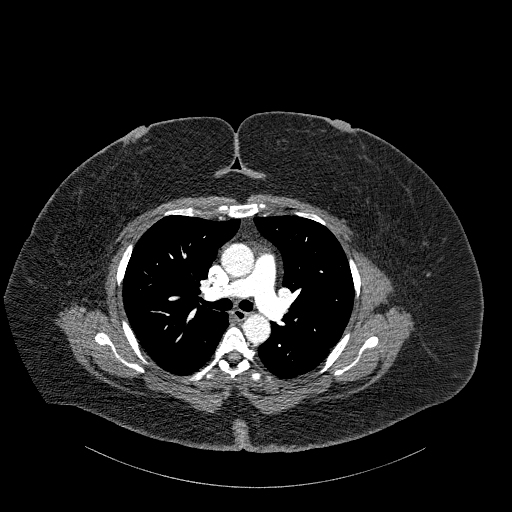
[im 220/299  lung]
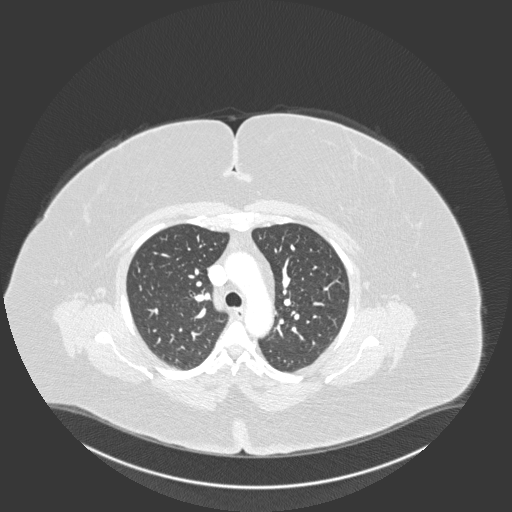
[im 236/299  soft-tissue]
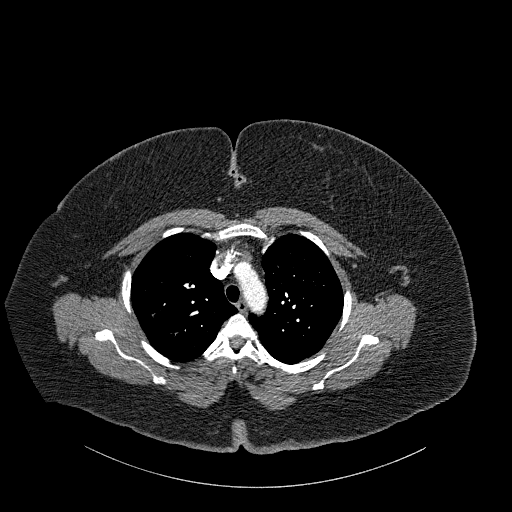
[im 267/299  lung]
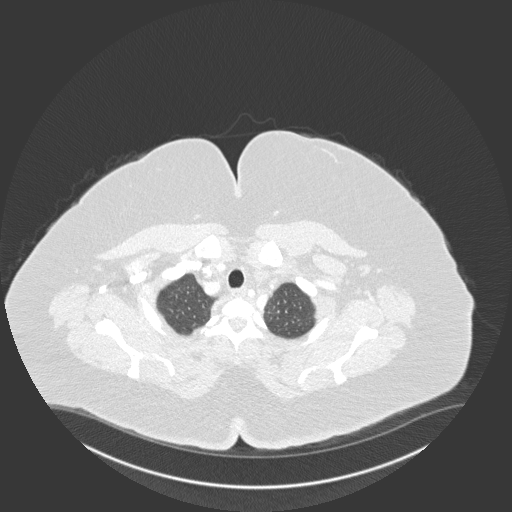
[im 283/299  soft-tissue]
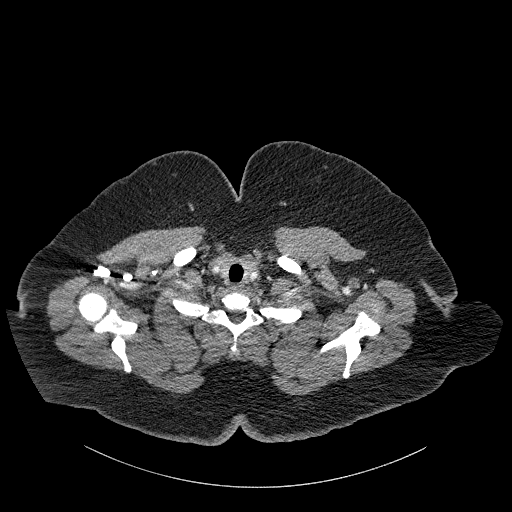

[Series 8: coronal mpr · coronal · 0.59mm/px · 3 of 183 slices shown]
[im 46/183  soft-tissue]
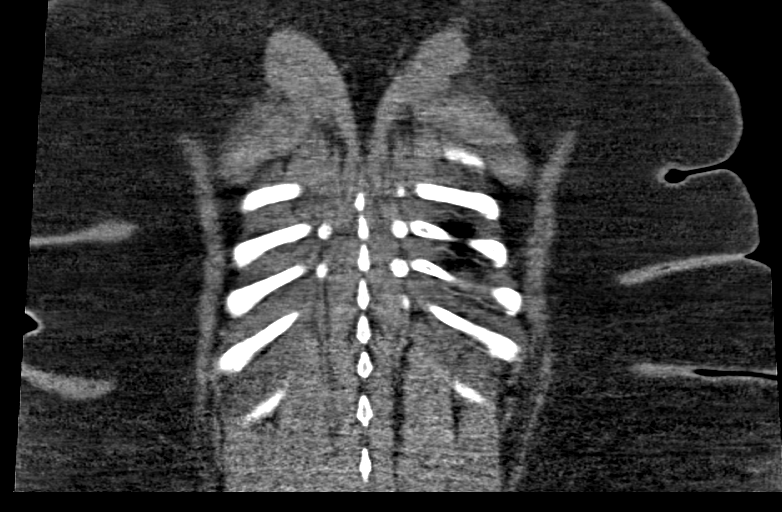
[im 92/183  soft-tissue]
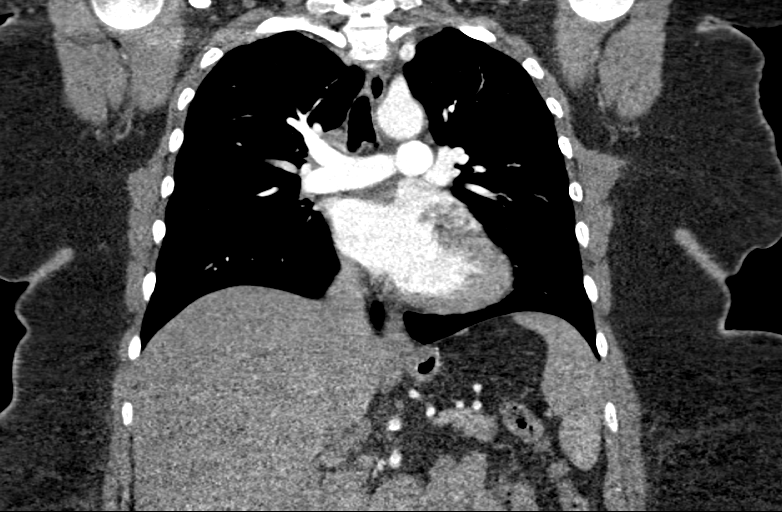
[im 137/183  soft-tissue]
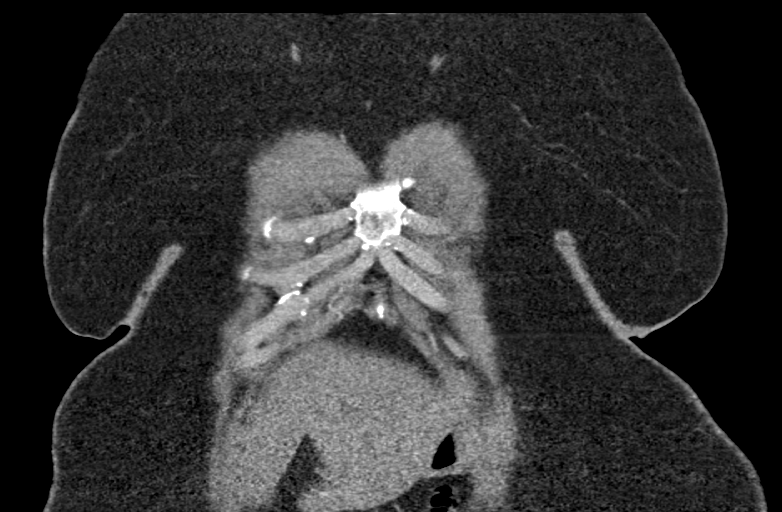

[17 of 46 positions shown; findings below may reference images not displayed]

FINDINGS: Cardiovascular: Peripheral pulmonary embolism with nonocclusive
central contrast filling defect at the bifurcation of the right
lower lobe medial segmental branches. No central pulmonary embolism.
RV/LV ratio 0.8; no right heart strain. Normal caliber of the
thoracic aorta with cardiac motion at the root, but no aneurysmal
dilatation or dissection or calcified atherosclerotic disease. Mild
3-vessel coronary calcification. Normal heart size.

Mediastinum/Nodes: No adenopathy. Normal CT appearance of the
esophagus and partially imaged thyroid.

Lungs/Pleura: Minimal subpleural atelectasis. No pleural effusion or
pneumonia. A 6 mm ovoid sub solid right middle lobe nodule fissural
based along the minor fissure on axial series 5, image 39, of
unknown stability. A 3 mm subsolid nodule in the right lower lobe on
image 54, not significantly changed compared to the July 05, 2013
CT abdomen series 4, image 5.

Upper Abdomen: Remote gastric sleeve surgical sutures. Decompressed
gastric lumen without an apparent abnormality. Mild hepatomegaly.
Cholecystectomy. Stable mild thickening of the left adrenal gland.
Normal right adrenal gland.

Musculoskeletal: Minimal vertebral body degenerative endplate
change.

Review of the MIP images confirms the above findings.

Critical Value/emergent results and recommended lung nodule
surveillance imaging were called by telephone at the time of
interpretation on 06/25/2019 at [DATE] to provider Dr. Evodio
Franndy, who verbally acknowledged these results.
IMPRESSION: Peripheral pulmonary embolism, nonocclusive.  No right heart strain.

A 6 mm right middle lobe subsolid fissural based nodule of unknown
stability. Initial follow-up with CT at 6-12 months is recommended
to confirm persistence. If persistent, repeat CT is recommended
every 2 years until 5 years of stability has been established. This
recommendation follows the consensus statement: Guidelines for
Management of Incidental Pulmonary Nodules Detected on CT Images:

Mild three-vessel coronary calcification.

## 2021-12-09 ENCOUNTER — Other Ambulatory Visit: Payer: Self-pay | Admitting: Family Medicine

## 2021-12-09 DIAGNOSIS — I10 Essential (primary) hypertension: Secondary | ICD-10-CM

## 2022-01-16 ENCOUNTER — Encounter: Payer: BC Managed Care – PPO | Admitting: Family Medicine

## 2022-04-29 ENCOUNTER — Other Ambulatory Visit: Payer: Self-pay | Admitting: Family Medicine

## 2022-04-29 DIAGNOSIS — I10 Essential (primary) hypertension: Secondary | ICD-10-CM

## 2022-05-15 NOTE — Telephone Encounter (Signed)
I spoke with patient. She said she never received Cologard test by mail.  She is due her AEX in March and she asked to schedule it now and she will talk with Wende Crease, NP at visit about reordering the test.  Message sent to appt desk to schedule AEX.

## 2022-05-15 NOTE — Telephone Encounter (Signed)
[name], Karen  Lamin Chandley R, RMA Left message for patient to call and schedule appointment. 

## 2022-05-22 NOTE — Telephone Encounter (Signed)
Karen [name], RMA GCG/Two messages were left for patient to call and schedule appointment; patient did not return our call.

## 2022-06-07 ENCOUNTER — Other Ambulatory Visit: Payer: Self-pay | Admitting: Family Medicine

## 2022-06-12 ENCOUNTER — Ambulatory Visit (INDEPENDENT_AMBULATORY_CARE_PROVIDER_SITE_OTHER): Payer: BC Managed Care – PPO

## 2022-06-12 ENCOUNTER — Encounter: Payer: Self-pay | Admitting: Physician Assistant

## 2022-06-12 ENCOUNTER — Telehealth: Payer: Self-pay | Admitting: Physician Assistant

## 2022-06-12 ENCOUNTER — Other Ambulatory Visit: Payer: Self-pay | Admitting: Physician Assistant

## 2022-06-12 ENCOUNTER — Ambulatory Visit: Payer: BC Managed Care – PPO | Admitting: Physician Assistant

## 2022-06-12 DIAGNOSIS — M1712 Unilateral primary osteoarthritis, left knee: Secondary | ICD-10-CM

## 2022-06-12 DIAGNOSIS — M1710 Unilateral primary osteoarthritis, unspecified knee: Secondary | ICD-10-CM

## 2022-06-12 DIAGNOSIS — M1711 Unilateral primary osteoarthritis, right knee: Secondary | ICD-10-CM

## 2022-06-12 MED ORDER — MELOXICAM 7.5 MG PO TABS: 7.5000 mg | ORAL_TABLET | Freq: Every day | ORAL | 0 refills | Status: DC

## 2022-06-12 NOTE — Telephone Encounter (Signed)
Patient called in stating she spoke with her PCP and she can take the anti inflammatory medication Bevely Palmer was going to provide her today. Stated we can call her in need be please advise.

## 2022-06-12 NOTE — Progress Notes (Signed)
Office Visit Note   Patient: Karen [name]           Date of Birth: [birth year]           MRN: [id number] Visit Date: 06/12/2022              Requested by: Dorna Mai, [address] [place],  [place] [id number] PCP: Dorna Mai, MD   Assessment & Plan: Visit Diagnoses:  1. Unilateral primary osteoarthritis, right knee   2. Unilateral primary osteoarthritis, left knee     Plan: Patient is a pleasant 52 year old woman with a known history of bilateral tricompartmental arthritis of her knees.  She is a former patient of Dr. Junius Roads and he gave her ultrasound-guided injections into her knees which seem to help.  She more recently had an injection by another provider and it did not help her as much.  Denies any new injury.  Her current BMI is 43.9.  I had a long discussion with her.  I think ultimately she will require knee replacements and she thinks this is the case as well.  She would have to lose about 25 pounds and she says she can do this because she is already lost over 100.  Unfortunately she cannot take anti-inflammatories because of a gastric sleeve she is using topical medication but it is not very helpful.  I will refer her to Dr. Rolena Infante and she is willing to try 1 more set of ultrasound-guided knee injections.  I also talked to her about physical therapy to strengthen her legs she is interested in doing this as well.  I will touch back base with her in another 6 weeks to see how she is doing  Follow-Up Instructions: 6 weeks  Orders:  Orders Placed This Encounter  Procedures   XR Knee 1-2 Views Left   XR Knee 1-2 Views Right   Ambulatory referral to Physical Therapy   Ambulatory referral to Orthopedic Surgery   No orders of the defined types were placed in this encounter.     Procedures: No procedures performed   Clinical Data: No additional findings.   Subjective: Chief Complaint  Patient presents with   Left Knee - Pain   Right Knee - Pain     HPI pleasant 52 year old woman with history of bilateral varus arthritis of her knees.  She has been helped with ultrasound-guided injections in the past.  Most recently had them a couple years ago but did not quite feel like it helped her.  Pain Assessment  Average Pain: 7 Current Pain: 7 Aggravating Factors: Walking weightbearing activities Alleviating Factors: Rest Review of Systems  All other systems reviewed and are negative.    Objective: Vital Signs: There were no vitals taken for this visit.  Physical Exam Constitutional:      Appearance: Normal appearance.  Pulmonary:     Effort: Pulmonary effort is normal.  Skin:    General: Skin is warm and dry.  Neurological:     General: No focal deficit present.     Mental Status: She is alert.     Ortho Exam Bilateral knees she has no erythema no effusions.  She does has varus alignment.  She is tender more over the medial joint line.  She has very little patellar mobility.  Compartments are soft and nontender.  She is neurovascular intact Specialty Comments:  No specialty comments available.  Imaging: XR Knee 1-2 Views Left  Result Date: 06/12/2022 2 view radiographs of  her left knee were obtained today.  She has varus alignment with bone-on-bone degenerative changes and periarticular osteophytes especially the of the medial compartment but also the patellofemoral joint and lateral compartment.  No acute changes  XR Knee 1-2 Views Right  Result Date: 06/12/2022 Three-view x-rays of her right knee were taken today.  She has advanced tricompartmental arthritis with varus alignment.  Almost bone-on-bone in the medial compartment bone-on-bone patellofemoral compartment no acute changes    PMFS History: Patient Active Problem List   Diagnosis Date Noted   OSA (obstructive sleep apnea) 10/26/2019   Nocturnal hypoxemia due to obesity 10/26/2019   Depression 07/29/2019   Chronic anticoagulation 07/29/2019   Nocturnal  dyspnea 07/29/2019   History of 2019 novel coronavirus disease (COVID-19) 07/29/2019   Right leg pain 07/29/2019   Brain fog 07/29/2019   Acute pulmonary embolism ([place]) 06/26/2019   Incidental lung nodule 06/26/2019   Steatosis of liver 06/25/2018   Carpal tunnel syndrome of right wrist 02/10/2018   Essential hypertension 10/26/2016   History of hypertension 10/26/2016   Menorrhagia 02/22/2015   Morbid obesity ([place]) 11/10/2012   Past Medical History:  Diagnosis Date   Acute pulmonary embolism ([place]) 06/26/2019   Arthritis    Left knee   Carpal tunnel syndrome of right wrist 02/10/2018   Essential hypertension 10/26/2016   Family history of early CAD    Gallstone 06/25/2018   Hypertension    Incidental lung nodule 06/26/2019   Menorrhagia 02/22/2015   Morbid obesity ([place]) 11/10/2012   BMI 63    Pain in right hand 01/27/2018   PONV (postoperative nausea and vomiting)    Right upper quadrant pain 06/25/2018   Steatosis of liver 06/25/2018    Family History  Problem Relation Age of Onset   Hyperlipidemia Father    Heart attack Father    Heart attack Other    Hypertension Other    Cancer Other    Stroke Other    Hypertension Mother    Parkinson's disease Mother     Past Surgical History:  Procedure Laterality Date   CARPAL TUNNEL RELEASE Right 04/04/2018   Procedure: CARPAL TUNNEL RELEASE;  Surgeon: Latanya Maudlin, MD;  Location: [facility];  Service: Orthopedics;  Laterality: Right;  78mn   CHOLECYSTECTOMY     gastic sleeve  2015   HYSTEROSCOPY WITH NOVASURE N/A 03/29/2015   Procedure: HYSTEROSCOPY WITH NOVASURE;  Surgeon: TAnastasio Auerbach MD;  [facility];  Service: Gynecology;  Laterality: N/A;  to follow first case around 10:15am.  Requests one hour OR time.   INTRAUTERINE DEVICE INSERTION     X 2  Spontaneously extruded X 2.   2014/2015   TUBAL LIGATION  approximately 2000   as an interval procedure historically   Social History   Occupational History   Not on  file  Tobacco Use   Smoking status: Former   Smokeless tobacco: Never  VScientific laboratory technicianUse: Never used  Substance and Sexual Activity   Alcohol use: Yes    Comment: occas    Drug use: No   Sexual activity: Yes    Partners: Male    Birth control/protection: Surgical    Comment: BTL

## 2022-06-12 NOTE — Telephone Encounter (Signed)
I called pt. She stated she is no longer on elliquis

## 2022-06-26 ENCOUNTER — Telehealth: Payer: Self-pay | Admitting: Sports Medicine

## 2022-06-26 NOTE — Telephone Encounter (Signed)
Pt called for a referral appt with Dr Rolena Infante. Spoke with Gabriel Cirri that sent referral. Lilia Pro please call pt to set appt with attached referral. Pt phone number is [phone]

## 2022-06-27 ENCOUNTER — Telehealth: Payer: Self-pay | Admitting: Sports Medicine

## 2022-06-27 NOTE — Telephone Encounter (Signed)
Attempted to call patient. No answer;  Just needs an appointment for Bilateral knee injections under [place] (30 min) appointment

## 2022-06-27 NOTE — Telephone Encounter (Signed)
Patient states she miss a call and states tp please call and leave a message with details about her injection

## 2022-06-28 NOTE — Telephone Encounter (Signed)
See other message

## 2022-06-28 NOTE — Telephone Encounter (Signed)
Spoke with patient.  She is scheduled for next week with Dr. Rolena Infante for bilateral knee injections

## 2022-07-03 ENCOUNTER — Encounter: Payer: Self-pay | Admitting: Sports Medicine

## 2022-07-03 ENCOUNTER — Ambulatory Visit (INDEPENDENT_AMBULATORY_CARE_PROVIDER_SITE_OTHER): Payer: BC Managed Care – PPO | Admitting: Sports Medicine

## 2022-07-03 ENCOUNTER — Other Ambulatory Visit: Payer: Self-pay

## 2022-07-03 DIAGNOSIS — M170 Bilateral primary osteoarthritis of knee: Secondary | ICD-10-CM | POA: Diagnosis not present

## 2022-07-03 DIAGNOSIS — M25562 Pain in left knee: Secondary | ICD-10-CM | POA: Diagnosis not present

## 2022-07-03 DIAGNOSIS — M25561 Pain in right knee: Secondary | ICD-10-CM | POA: Diagnosis not present

## 2022-07-03 DIAGNOSIS — M1712 Unilateral primary osteoarthritis, left knee: Secondary | ICD-10-CM | POA: Diagnosis not present

## 2022-07-03 DIAGNOSIS — G8929 Other chronic pain: Secondary | ICD-10-CM

## 2022-07-03 DIAGNOSIS — M1711 Unilateral primary osteoarthritis, right knee: Secondary | ICD-10-CM | POA: Diagnosis not present

## 2022-07-03 MED ORDER — METHYLPREDNISOLONE ACETATE 40 MG/ML IJ SUSP
40.0000 mg | INTRAMUSCULAR | Status: AC | PRN
  Administered 2022-07-03: 40 mg via INTRA_ARTICULAR

## 2022-07-03 MED ORDER — BUPIVACAINE HCL 0.25 % IJ SOLN
2.0000 mL | INTRAMUSCULAR | Status: AC | PRN
  Administered 2022-07-03: 2 mL via INTRA_ARTICULAR

## 2022-07-03 MED ORDER — LIDOCAINE HCL 1 % IJ SOLN
2.0000 mL | INTRAMUSCULAR | Status: AC | PRN
  Administered 2022-07-03: 2 mL

## 2022-07-03 NOTE — Progress Notes (Signed)
   Procedure Note  Patient: Karen [name]             Date of Birth: March 15, 1971           MRN: [id number]             Visit Date: 07/03/2022  Procedures: Visit Diagnoses:  1. Chronic pain of both knees   2. Unilateral primary osteoarthritis, right knee   3. Unilateral primary osteoarthritis, left knee    Large Joint Inj: bilateral knee on 07/03/2022 4:41 PM Indications: pain Details: 22 G 1.5 in needle, ultrasound-guided superolateral approach Medications (Right): 2 mL lidocaine 1 %; 2 mL bupivacaine 0.25 %; 40 mg methylPREDNISolone acetate 40 MG/ML Medications (Left): 2 mL lidocaine 1 %; 2 mL bupivacaine 0.25 %; 40 mg methylPREDNISolone acetate 40 MG/ML Outcome: tolerated well, no immediate complications  Procedure: Ultrasound-guided Knee Injection, Right After discussion on risk/benefits/indication, informed verbal consent was obtained. A timeout was then performed. The patient was lying in supine on examination table with a small bolster underneath the affected knee for comfort. The patient's knee was prepped with Chloraprep and alcohol swabs. Utilizing ultrasound-guidance with the probe in a transverse position, the patient's suprapatellar bursa was identified and the knee joint was subsequently injected intraarticularly with a mixture of 2:2:1 lidocaine:bupivicaine:depomedrol utilizing an in-plane visualization approach. Patient tolerated the procedure well without immediate complications.  Procedure: Ultrasound-guided Knee Injection, Left After discussion on risk/benefits/indication, informed verbal consent was obtained. A timeout was then performed. The patient was lying in supine on examination table with a small bolster underneath the affected knee for comfort. The patient's knee was prepped with Chloraprep and alcohol swabs. Utilizing ultrasound-guidance with the probe in a transverse position, the patient's suprapatellar bursa was identified and the knee joint was subsequently  injected intraarticularly with a mixture of 2:2:1 lidocaine:bupivicaine:depomedrol utilizing an in-plane visualization approach. Patient tolerated the procedure well without immediate complications.  Procedure, treatment alternatives, risks and benefits explained, specific risks discussed. Consent was given by the patient. Immediately prior to procedure a time out was called to verify the correct patient, procedure, equipment, support staff and site/side marked as required. Patient was prepped and draped in the usual sterile fashion.     -Patient tolerated procedure well, postinjection protocol discussed -She may use ice, over-the-counter anti-inflammatories for any postinjection pain -Discussed we may repeat these on every 3-33-month intervals if desired; she may follow-up with [facility] as needed  Elba Barman, DO [place]  This note was dictated using Dragon naturally speaking software and may contain errors in syntax, spelling, or content which have not been identified prior to signing this note.

## 2022-07-05 NOTE — Telephone Encounter (Signed)
I have attempted to contact this patient by phone with the following results: left message to return my call on answering machine. Copied from [place] [id number]. Topic: Appointment Scheduling - Scheduling Inquiry for Clinic >> Jul 03, 2022 12:53 PM Denman George T wrote: Reason for CRM: Patient called to see if she could change her 4/15 Physical appt to April 4th any time. Please call her back at [phone]. She also need to see if she need refills as well

## 2022-07-12 ENCOUNTER — Ambulatory Visit (INDEPENDENT_AMBULATORY_CARE_PROVIDER_SITE_OTHER): Payer: BC Managed Care – PPO | Admitting: Family Medicine

## 2022-07-12 ENCOUNTER — Ambulatory Visit: Payer: BC Managed Care – PPO | Admitting: Physical Therapy

## 2022-07-12 VITALS — BP 121/84 | HR 90 | Temp 98.1°F | Resp 16 | Ht 62.0 in | Wt 244.0 lb

## 2022-07-12 DIAGNOSIS — Z13220 Encounter for screening for lipoid disorders: Secondary | ICD-10-CM | POA: Diagnosis not present

## 2022-07-12 DIAGNOSIS — Z13228 Encounter for screening for other metabolic disorders: Secondary | ICD-10-CM

## 2022-07-12 DIAGNOSIS — Z130 Encounter for screening for diseases of the blood and blood-forming organs and certain disorders involving the immune mechanism: Secondary | ICD-10-CM | POA: Diagnosis not present

## 2022-07-12 DIAGNOSIS — Z1159 Encounter for screening for other viral diseases: Secondary | ICD-10-CM

## 2022-07-12 DIAGNOSIS — Z1329 Encounter for screening for other suspected endocrine disorder: Secondary | ICD-10-CM | POA: Diagnosis not present

## 2022-07-12 DIAGNOSIS — Z6841 Body Mass Index (BMI) 40.0 and over, adult: Secondary | ICD-10-CM

## 2022-07-12 DIAGNOSIS — Z1211 Encounter for screening for malignant neoplasm of colon: Secondary | ICD-10-CM

## 2022-07-12 DIAGNOSIS — Z0000 Encounter for general adult medical examination without abnormal findings: Secondary | ICD-10-CM | POA: Diagnosis not present

## 2022-07-12 MED ORDER — MIRTAZAPINE 15 MG PO TABS: ORAL_TABLET | ORAL | 1 refills | Status: DC

## 2022-07-12 MED ORDER — TRIAMCINOLONE ACETONIDE 0.5 % EX CREA: 1.0000 | TOPICAL_CREAM | Freq: Two times a day (BID) | CUTANEOUS | 0 refills | Status: DC | PRN

## 2022-07-12 MED ORDER — AMLODIPINE BESYLATE 10 MG PO TABS: 10.0000 mg | ORAL_TABLET | Freq: Every day | ORAL | 0 refills | Status: DC

## 2022-07-13 ENCOUNTER — Other Ambulatory Visit: Payer: Self-pay | Admitting: Family Medicine

## 2022-07-13 ENCOUNTER — Encounter: Payer: Self-pay | Admitting: Family Medicine

## 2022-07-13 DIAGNOSIS — R42 Dizziness and giddiness: Secondary | ICD-10-CM

## 2022-07-13 LAB — VITAMIN D 25 HYDROXY (VIT D DEFICIENCY, FRACTURES): Vit D, 25-Hydroxy: 17.4 ng/mL — ABNORMAL LOW (ref 30.0–100.0)

## 2022-07-13 LAB — LIPID PANEL
Chol/HDL Ratio: 3.6 ratio (ref 0.0–4.4)
Cholesterol, Total: 143 mg/dL (ref 100–199)
HDL: 40 mg/dL (ref >39)
LDL Chol Calc (NIH): 88 mg/dL (ref 0–99)
Triglycerides: 79 mg/dL (ref 0–149)
VLDL Cholesterol Cal: 15 mg/dL (ref 5–40)

## 2022-07-13 LAB — CBC WITH DIFFERENTIAL/PLATELET
Basophils Absolute: 0 10*3/uL (ref 0.0–0.2)
Basos: 0 %
EOS (ABSOLUTE): 0.1 10*3/uL (ref 0.0–0.4)
Eos: 1 %
Hematocrit: 36.7 % (ref 34.0–46.6)
Hemoglobin: 11.5 g/dL (ref 11.1–15.9)
Immature Grans (Abs): 0.1 10*3/uL (ref 0.0–0.1)
Immature Granulocytes: 1 %
Lymphocytes Absolute: 2.2 10*3/uL (ref 0.7–3.1)
Lymphs: 30 %
MCH: 27.5 pg (ref 26.6–33.0)
MCHC: 31.3 g/dL — ABNORMAL LOW (ref 31.5–35.7)
MCV: 88 fL (ref 79–97)
Monocytes Absolute: 0.6 10*3/uL (ref 0.1–0.9)
Monocytes: 8 %
Neutrophils Absolute: 4.4 10*3/uL (ref 1.4–7.0)
Neutrophils: 60 %
Platelets: 272 10*3/uL (ref 150–450)
RBC: 4.18 x10E6/uL (ref 3.77–5.28)
RDW: 13.3 % (ref 11.7–15.4)
WBC: 7.3 10*3/uL (ref 3.4–10.8)

## 2022-07-13 LAB — CMP14+EGFR
ALT: 17 IU/L (ref 0–32)
AST: 21 IU/L (ref 0–40)
Albumin/Globulin Ratio: 1.5 (ref 1.2–2.2)
Albumin: 3.9 g/dL (ref 3.8–4.9)
Alkaline Phosphatase: 119 IU/L (ref 44–121)
BUN/Creatinine Ratio: 14 (ref 9–23)
BUN: 8 mg/dL (ref 6–24)
Bilirubin Total: 0.3 mg/dL (ref 0.0–1.2)
CO2: 25 mmol/L (ref 20–29)
Calcium: 8.9 mg/dL (ref 8.7–10.2)
Chloride: 105 mmol/L (ref 96–106)
Creatinine, Ser: 0.58 mg/dL (ref 0.57–1.00)
Globulin, Total: 2.6 g/dL (ref 1.5–4.5)
Glucose: 83 mg/dL (ref 70–99)
Potassium: 4.8 mmol/L (ref 3.5–5.2)
Sodium: 144 mmol/L (ref 134–144)
Total Protein: 6.5 g/dL (ref 6.0–8.5)
eGFR: 108 mL/min/{1.73_m2} (ref >59)

## 2022-07-13 LAB — HEMOGLOBIN A1C
Est. average glucose Bld gHb Est-mCnc: 91 mg/dL
Hgb A1c MFr Bld: 4.8 % (ref 4.8–5.6)

## 2022-07-13 LAB — HEPATITIS C ANTIBODY: Hep C Virus Ab: NONREACTIVE

## 2022-07-13 LAB — TSH: TSH: 2 u[IU]/mL (ref 0.450–4.500)

## 2022-07-13 MED ORDER — MECLIZINE HCL 12.5 MG PO TABS
12.5000 mg | ORAL_TABLET | Freq: Three times a day (TID) | ORAL | 0 refills | Status: AC | PRN
Start: 2022-07-13 — End: ?

## 2022-07-13 MED ORDER — VITAMIN D (ERGOCALCIFEROL) 1.25 MG (50000 UNIT) PO CAPS: 50000.0000 [IU] | ORAL_CAPSULE | ORAL | 0 refills | Status: AC

## 2022-07-13 NOTE — Telephone Encounter (Signed)
I have attempted without success to contact this patient by phone to return their call and I left a message on answering machine  Provider not in office. I LVM to try OTC medication if needed before provider gets back.

## 2022-07-13 NOTE — Telephone Encounter (Signed)
Copied from CRM [id number]. Topic: General - Other >> Jul 13, 2022  9:40 AM Everette C wrote: Reason for CRM: The patient has called to follow up on previously requested medications for nausea   Please contact the patient further when possible

## 2022-07-13 NOTE — Addendum Note (Signed)
Addended by: Kieth Brightly on: 07/13/2022 04:11 PM   Modules accepted: Orders

## 2022-07-13 NOTE — Telephone Encounter (Signed)
Copied from CRM [id number]. Topic: General - Other >> Jul 13, 2022  2:12 PM Karen [name] wrote: Reason for CRM: The patient has made an additional phone call to follow up on their previous request for nausea medication   The patient will be traveling on 07/16/22 and shares that their last available day to pick up medication would be 07/14/22  Please contact the patient further when available

## 2022-07-13 NOTE — Progress Notes (Signed)
Established Patient Office Visit  Subjective    Patient ID: Karen [name], female    DOB: 08-16-1970  Age: 52 y.o. MRN: [id number]  CC: No chief complaint on file.   HPI Karen [name] presents for routine annual exam. Patient denies acute complaints or concerns.    Outpatient Encounter Medications as of 07/12/2022  Medication Sig   albuterol (VENTOLIN HFA) 108 (90 Base) MCG/ACT inhaler TAKE 2 PUFFS BY MOUTH EVERY 6 HOURS AS NEEDED FOR WHEEZE OR SHORTNESS OF BREATH   amLODipine (NORVASC) 10 MG tablet Take 1 tablet (10 mg total) by mouth daily.   apixaban (ELIQUIS) 5 MG TABS tablet Take 1 tablet (5 mg total) by mouth 2 (two) times daily. (Patient not taking: Reported on 07/05/2021)   butalbital-acetaminophen-caffeine (FIORICET) 50-325-40 MG tablet Take 50-235 tablets by mouth as needed.   Calcium Carbonate-Vitamin D (CALCIUM-VITAMIN D) 600-125 MG-UNIT TABS Take 1 tablet by mouth daily.   CREON 36000-114000 units CPEP capsule Take 1 capsule by mouth 3 (three) times daily with meals.   furosemide (LASIX) 40 MG tablet Take 1 tablet (40 mg total) by mouth once a week. Pt. Need to schedule an appt in order to receive future refills. Thank you. 2nd attempt.   gabapentin (NEURONTIN) 300 MG capsule START WITH 1 CAPSULE AT BEDTIME X 3 DAYS THEN 2 CAPS AT BEDTIME EVERY DAY   megestrol (MEGACE) 40 MG tablet TAKE 1 TABLET (40 MG TOTAL) BY MOUTH 2 (TWO) TIMES DAILY. UNTIL BLEEDING STOPS   meloxicam (MOBIC) 7.5 MG tablet Take 1 tablet (7.5 mg total) by mouth daily.   mirtazapine (REMERON) 15 MG tablet TAKE 1 TABLET BY MOUTH EVERYDAY AT BEDTIME   Naltrexone-buPROPion HCl ER (CONTRAVE) 8-90 MG TB12 1TAB IN AM X1WK, 1 TAB TWICE DAILY X1WK, 2TABS IN AM 1 IN PM X1WK, 2 TABS TWICE DAILY   pantoprazole (PROTONIX) 40 MG tablet TAKE 1 TABLET BY MOUTH EVERY DAY   potassium chloride (KLOR-CON) 10 MEQ tablet TAKE 1 TABLET EVERY MONDAY   rosuvastatin (CRESTOR) 10 MG tablet TAKE 1 TABLET BY MOUTH EVERY DAY    sucralfate (CARAFATE) 1 g tablet Take 1 tablet (1 g total) by mouth 4 (four) times daily -  with meals and at bedtime.   traMADol (ULTRAM) 50 MG tablet TAKE 1 TABLET (50 MG TOTAL) BY MOUTH EVERY 6 (SIX) HOURS AS NEEDED FOR UP TO 5 DAYS.   triamcinolone cream (KENALOG) 0.5 % Apply 1 Application topically 2 (two) times daily as needed.   [DISCONTINUED] amLODipine (NORVASC) 10 MG tablet TAKE 1 TABLET BY MOUTH EVERY DAY   [DISCONTINUED] mirtazapine (REMERON) 15 MG tablet TAKE 1 TABLET BY MOUTH EVERYDAY AT BEDTIME   [DISCONTINUED] triamcinolone cream (KENALOG) 0.5 % Apply 1 Application topically 2 (two) times daily as needed.   No facility-administered encounter medications on file as of 07/12/2022.    Past Medical History:  Diagnosis Date   Acute pulmonary embolism 06/26/2019   Arthritis    Left knee   Carpal tunnel syndrome of right wrist 02/10/2018   Essential hypertension 10/26/2016   Family history of early CAD    Gallstone 06/25/2018   Hypertension    Incidental lung nodule 06/26/2019   Menorrhagia 02/22/2015   Morbid obesity 11/10/2012   BMI 63    Pain in right hand 01/27/2018   PONV (postoperative nausea and vomiting)    Right upper quadrant pain 06/25/2018   Steatosis of liver 06/25/2018    Past Surgical History:  Procedure Laterality Date  CARPAL TUNNEL RELEASE Right 04/04/2018   Procedure: CARPAL TUNNEL RELEASE;  Surgeon: Ranee GosselinGioffre, Ronald, MD;  Location: [facility];  Service: Orthopedics;  Laterality: Right;  60min   CHOLECYSTECTOMY     gastic sleeve  2015   HYSTEROSCOPY WITH NOVASURE N/A 03/29/2015   Procedure: HYSTEROSCOPY WITH NOVASURE;  Surgeon: Dara Lordsimothy P Fontaine, MD;  Location: [facility];  Service: Gynecology;  Laterality: N/A;  to follow first case around 10:15am.  Requests one hour OR time.   INTRAUTERINE DEVICE INSERTION     X 2  Spontaneously extruded X 2.   2014/2015   TUBAL LIGATION  approximately 2000   as an interval procedure historically    Family History  Problem  Relation Age of Onset   Hyperlipidemia Father    Heart attack Father    Heart attack Other    Hypertension Other    Cancer Other    Stroke Other    Hypertension Mother    Parkinson's disease Mother     Social History   Socioeconomic History   Marital status: Married    Spouse name: Not on file   Number of children: Not on file   Years of education: Not on file   Highest education level: Not on file  Occupational History   Not on file  Tobacco Use   Smoking status: Former   Smokeless tobacco: Never  Vaping Use   Vaping Use: Never used  Substance and Sexual Activity   Alcohol use: Yes    Comment: occas    Drug use: No   Sexual activity: Yes    Partners: Male    Birth control/protection: Surgical    Comment: BTL  Other Topics Concern   Not on file  Social History Narrative   Right handed   Lives with friend, two story home   Social Determinants of Health   Financial Resource Strain: Not on file  Food Insecurity: Not on file  Transportation Needs: Not on file  Physical Activity: Not on file  Stress: Not on file  Social Connections: Not on file  Intimate Partner Violence: Not on file    Review of Systems  All other systems reviewed and are negative.       Objective    BP 121/84   Pulse 90   Temp 98.1 F (36.7 C) (Oral)   Resp 16   Ht 5\' 2"  (1.575 m)   Wt 244 lb (110.7 kg)   SpO2 94%   BMI 44.63 kg/m   Physical Exam Vitals and nursing note reviewed.  Constitutional:      General: She is not in acute distress. HENT:     Head: Normocephalic and atraumatic.     Right Ear: Tympanic membrane, ear canal and external ear normal.     Left Ear: Tympanic membrane, ear canal and external ear normal.     Nose: Nose normal.     Mouth/Throat:     Mouth: Mucous membranes are moist.     Pharynx: Oropharynx is clear.  Eyes:     Conjunctiva/sclera: Conjunctivae normal.     Pupils: Pupils are equal, round, and reactive to light.  Neck:     Thyroid: No  thyromegaly.  Cardiovascular:     Rate and Rhythm: Normal rate and regular rhythm.     Heart sounds: Normal heart sounds. No murmur heard. Pulmonary:     Effort: Pulmonary effort is normal. No respiratory distress.     Breath sounds: Normal breath sounds.  Abdominal:  General: There is no distension.     Palpations: Abdomen is soft. There is no mass.     Tenderness: There is no abdominal tenderness.  Musculoskeletal:        General: Normal range of motion.     Cervical back: Normal range of motion and neck supple.  Skin:    General: Skin is warm and dry.  Neurological:     General: No focal deficit present.     Mental Status: She is alert and oriented to person, place, and time.  Psychiatric:        Mood and Affect: Mood normal.        Behavior: Behavior normal.         Assessment & Plan:   1. Annual physical exam  - CMP14+EGFR  2. Screening for deficiency anemia  - CBC with Differential  3. Screening for lipid disorders  - Lipid Panel  4. Screening for endocrine/metabolic/immunity disorders  - Vitamin D, 25-hydroxy - TSH - Hemoglobin A1c  5. Screening for colon cancer  - Cologuard  6. Need for hepatitis C screening test  - Hepatitis C Antibody    Return in about 6 months (around 01/11/2023) for follow up.   Tommie RaymondWilson, Kyrin Garn P, MD

## 2022-07-16 ENCOUNTER — Other Ambulatory Visit: Payer: Self-pay | Admitting: Family Medicine

## 2022-07-16 MED ORDER — ONDANSETRON HCL 4 MG PO TABS: 4.0000 mg | ORAL_TABLET | Freq: Three times a day (TID) | ORAL | 0 refills | Status: AC | PRN

## 2022-07-23 ENCOUNTER — Encounter: Payer: BC Managed Care – PPO | Admitting: Family Medicine

## 2022-07-23 NOTE — Therapy (Deleted)
OUTPATIENT PHYSICAL THERAPY LOWER EXTREMITY EVALUATION   Patient Name: Karen [name] MRN: [id number] DOB:February 16, 1971, 52 y.o., female Today's Date: 07/23/2022  END OF SESSION:   Past Medical History:  Diagnosis Date   Acute pulmonary embolism 06/26/2019   Arthritis    Left knee   Carpal tunnel syndrome of right wrist 02/10/2018   Essential hypertension 10/26/2016   Family history of early CAD    Gallstone 06/25/2018   Hypertension    Incidental lung nodule 06/26/2019   Menorrhagia 02/22/2015   Morbid obesity 11/10/2012   BMI 63    Pain in right hand 01/27/2018   PONV (postoperative nausea and vomiting)    Right upper quadrant pain 06/25/2018   Steatosis of liver 06/25/2018   Past Surgical History:  Procedure Laterality Date   CARPAL TUNNEL RELEASE Right 04/04/2018   Procedure: CARPAL TUNNEL RELEASE;  Surgeon: Ranee Gosselin, MD;  Location: [facility];  Service: Orthopedics;  Laterality: Right;    CHOLECYSTECTOMY     gastic sleeve  2015   HYSTEROSCOPY WITH NOVASURE N/A 03/29/2015   Procedure: HYSTEROSCOPY WITH NOVASURE;  Surgeon: Dara Lords, MD;  Location: [facility];  Service: Gynecology;  Laterality: N/A;  to follow first case around 10:15am.  Requests one hour OR time.   INTRAUTERINE DEVICE INSERTION     X 2  Spontaneously extruded X 2.   2014/2015   TUBAL LIGATION  approximately 2000   as an interval procedure historically   Patient Active Problem List   Diagnosis Date Noted   OSA (obstructive sleep apnea) 10/26/2019   Nocturnal hypoxemia due to obesity 10/26/2019   Depression 07/29/2019   Chronic anticoagulation 07/29/2019   Nocturnal dyspnea 07/29/2019   History of 2019 novel coronavirus disease (COVID-19) 07/29/2019   Right leg pain 07/29/2019   Brain fog 07/29/2019   Acute pulmonary embolism 06/26/2019   Incidental lung nodule 06/26/2019   Steatosis of liver 06/25/2018   Carpal tunnel syndrome of right wrist 02/10/2018   Essential hypertension  10/26/2016   History of hypertension 10/26/2016   Menorrhagia 02/22/2015   Morbid obesity 11/10/2012    PCP: Georganna Skeans, MD   REFERRING PROVIDER: Persons, [place], [place]  REFERRING DIAG: M17.11 (ICD-10-CM) - Unilateral primary osteoarthritis, right knee M17.12 (ICD-10-CM) - Unilateral primary osteoarthritis, left knee  THERAPY DIAG:  No diagnosis found.  Rationale for Evaluation and Treatment: Rehabilitation  ONSET DATE: ***  SUBJECTIVE:   SUBJECTIVE STATEMENT: ***  PERTINENT HISTORY:    Office Visit Note              Patient: Karen [name]                                    Date of Birth: Nov 29, 1970                                                      MRN: [id number] Visit Date: 06/12/2022  Requested by: Georganna Skeans, MD [address] [place],  [place] [address] PCP: Georganna Skeans, MD     Assessment & Plan: Visit Diagnoses:  1. Unilateral primary osteoarthritis, right knee   2. Unilateral primary osteoarthritis, left knee       Plan: Patient is a pleasant 52 year old woman with a known history of bilateral tricompartmental arthritis of her knees.  She is a former patient of Dr. Prince Rome and he gave her ultrasound-guided injections into her knees which seem to help.  She more recently had an injection by another provider and it did not help her as much.  Denies any new injury.  Her current BMI is 43.9.  I had a long discussion with her.  I think ultimately she will require knee replacements and she thinks this is the case as well.  She would have to lose about 25 pounds and she says she can do this because she is already lost over 100.  Unfortunately she cannot take anti-inflammatories because of a gastric sleeve she is using topical medication but it is not very helpful.  I will refer her to Dr. Shon Baton and she is willing to try 1 more set of ultrasound-guided knee injections.  I also talked  to her about physical therapy to strengthen her legs she is interested in doing this as well.  I will touch back base with her in another 6 weeks to see how she is doing   Follow-Up Instructions: 6 weeks     PAIN:  Are you having pain? {OPRCPAIN:27236}  PRECAUTIONS: None  WEIGHT BEARING RESTRICTIONS: No  FALLS:  Has patient fallen in last 6 months? No  LIVING ENVIRONMENT: Lives with: {[facility] lives with:25569::"lives with their family"} Lives in: {Lives in:[address]70} Stairs: {opstairs:27293} Has following equipment at home: {Assistive devices:23999}  OCCUPATION: ***  PLOF: {PLOF:24004}  PATIENT GOALS: ***  NEXT MD VISIT: ***  OBJECTIVE:   DIAGNOSTIC FINDINGS: XR Knee 1-2 Views Left   Result Date: 06/12/2022 2 view radiographs of her left knee were obtained today.  She has varus alignment with bone-on-bone degenerative changes and periarticular osteophytes especially the of the medial compartment but also the patellofemoral joint and lateral compartment.  No acute changes   XR Knee 1-2 Views Right   Result Date: 06/12/2022 Three-view x-rays of her right knee were taken today.  She has advanced tricompartmental arthritis with varus alignment.  Almost bone-on-bone in the medial compartment bone-on-bone patellofemoral compartment no acute changes   PATIENT SURVEYS:  FOTO ***  MUSCLE LENGTH: Hamstrings: Right *** deg; Left *** deg Maisie Fus test: Right *** deg; Left *** deg  POSTURE: {posture:25561}  PALPATION: ***  LOWER EXTREMITY ROM:  {AROM/PROM:27142} ROM Right eval Left eval  Hip flexion    Hip extension    Hip abduction    Hip adduction    Hip internal rotation    Hip external rotation    Knee flexion    Knee extension    Ankle dorsiflexion    Ankle plantarflexion    Ankle inversion    Ankle eversion     (Blank rows = not tested)  LOWER EXTREMITY MMT:  MMT Right eval Left eval  Hip flexion    Hip extension    Hip abduction    Hip adduction    Hip  internal rotation    Hip external rotation    Knee flexion    Knee extension    Ankle dorsiflexion    Ankle plantarflexion    Ankle inversion  Ankle eversion     (Blank rows = not tested)  LOWER EXTREMITY SPECIAL TESTS:  {LEspecialtests:26242}  FUNCTIONAL TESTS:  {Functional tests:24029}  GAIT: Distance walked: *** Assistive device utilized: {Assistive devices:23999} Level of assistance: {Levels of assistance:24026} Comments: ***   TODAY'S TREATMENT:                                                                                                                              DATE: ***    PATIENT EDUCATION:  Education details: *** Person educated: {Person educated:25204} Education method: {Education Method:25205} Education comprehension: {Education Comprehension:25206}  HOME EXERCISE PROGRAM: ***  ASSESSMENT:  CLINICAL IMPRESSION: Patient is a *** y.o. *** who was seen today for physical therapy evaluation and treatment for ***.   OBJECTIVE IMPAIRMENTS: {opptimpairments:25111}.   ACTIVITY LIMITATIONS: {activitylimitations:27494}  PARTICIPATION LIMITATIONS: {participationrestrictions:25113}  PERSONAL FACTORS: {Personal factors:25162} are also affecting patient's functional outcome.   REHAB POTENTIAL: {rehabpotential:25112}  CLINICAL DECISION MAKING: {clinical decision making:25114}  EVALUATION COMPLEXITY: {Evaluation complexity:25115}   GOALS: Goals reviewed with patient? {yes/no:20286}  SHORT TERM GOALS: Target date: *** *** Baseline: Goal status: {GOALSTATUS:25110}  2.  *** Baseline:  Goal status: {GOALSTATUS:25110}  3.  *** Baseline:  Goal status: {GOALSTATUS:25110}  4.  *** Baseline:  Goal status: {GOALSTATUS:25110}  5.  *** Baseline:  Goal status: {GOALSTATUS:25110}  6.  *** Baseline:  Goal status: {GOALSTATUS:25110}  LONG TERM GOALS: Target date: ***  *** Baseline:  Goal status: {GOALSTATUS:25110}  2.  *** Baseline:  Goal  status: {GOALSTATUS:25110}  3.  *** Baseline:  Goal status: {GOALSTATUS:25110}  4.  *** Baseline:  Goal status: {GOALSTATUS:25110}  5.  *** Baseline:  Goal status: {GOALSTATUS:25110}  6.  *** Baseline:  Goal status: {GOALSTATUS:25110}   PLAN:  PT FREQUENCY: {rehab frequency:25116}  PT DURATION: {rehab duration:25117}  PLANNED INTERVENTIONS: {rehab planned interventions:25118::"Therapeutic exercises","Therapeutic activity","Neuromuscular re-education","Balance training","Gait training","Patient/Family education","Self Care","Joint mobilization"}  PLAN FOR NEXT SESSION: ***   Hildred Laser, PT 07/23/2022, 1:40 PM

## 2022-07-25 ENCOUNTER — Ambulatory Visit: Payer: BC Managed Care – PPO

## 2022-08-15 NOTE — Therapy (Addendum)
OUTPATIENT PHYSICAL THERAPY LOWER EXTREMITY EVALUATION + NO VISIT DISCHARGE SUMMARY (see below)    Patient Name: REGIS CAPURRO MRN: [id number] DOB:03/23/1971, 52 y.o., female Today's Date: 08/17/2022  END OF SESSION:  PT End of Session - 08/17/22 1320     Visit Number 1    Number of Visits 17    Date for PT Re-Evaluation 10/12/22    Authorization Type [employer]    PT Start Time 1322   late check in   PT Stop Time 1358    PT Time Calculation (min) 36 min    Activity Tolerance Patient tolerated treatment well    Behavior During Therapy [facility] for tasks assessed/performed             Past Medical History:  Diagnosis Date   Acute pulmonary embolism (HCC) 06/26/2019   Arthritis    Left knee   Carpal tunnel syndrome of right wrist 02/10/2018   Essential hypertension 10/26/2016   Family history of early CAD    Gallstone 06/25/2018   Hypertension    Incidental lung nodule 06/26/2019   Menorrhagia 02/22/2015   Morbid obesity (HCC) 11/10/2012   BMI 63    Pain in right hand 01/27/2018   PONV (postoperative nausea and vomiting)    Right upper quadrant pain 06/25/2018   Steatosis of liver 06/25/2018   Past Surgical History:  Procedure Laterality Date   CARPAL TUNNEL RELEASE Right 04/04/2018   Procedure: CARPAL TUNNEL RELEASE;  Surgeon: Ranee Gosselin, MD;  Location: [facility];  Service: Orthopedics;  Laterality: Right;    CHOLECYSTECTOMY     gastic sleeve  2015   HYSTEROSCOPY WITH NOVASURE N/A 03/29/2015   Procedure: HYSTEROSCOPY WITH NOVASURE;  Surgeon: Dara Lords, MD;  Location: [facility];  Service: Gynecology;  Laterality: N/A;  to follow first case around 10:15am.  Requests one hour OR time.   INTRAUTERINE DEVICE INSERTION     X 2  Spontaneously extruded X 2.   2014/2015   TUBAL LIGATION  approximately 2000   as an interval procedure historically   Patient Active Problem List   Diagnosis Date Noted   OSA (obstructive sleep apnea) 10/26/2019   Nocturnal hypoxemia due  to obesity 10/26/2019   Depression 07/29/2019   Chronic anticoagulation 07/29/2019   Nocturnal dyspnea 07/29/2019   History of 2019 novel coronavirus disease (COVID-19) 07/29/2019   Right leg pain 07/29/2019   Brain fog 07/29/2019   Acute pulmonary embolism (HCC) 06/26/2019   Incidental lung nodule 06/26/2019   Steatosis of liver 06/25/2018   Carpal tunnel syndrome of right wrist 02/10/2018   Essential hypertension 10/26/2016   History of hypertension 10/26/2016   Menorrhagia 02/22/2015   Morbid obesity (HCC) 11/10/2012    PCP: Georganna Skeans, MD  REFERRING PROVIDER: Persons, [place], [place]  REFERRING DIAG: M17.11 (ICD-10-CM) - Unilateral primary osteoarthritis, right knee M17.12 (ICD-10-CM) - Unilateral primary osteoarthritis, left knee  THERAPY DIAG:  Chronic pain of right knee  Chronic pain of left knee  Muscle weakness (generalized)  Other abnormalities of gait and mobility  Rationale for Evaluation and Treatment: Rehabilitation  ONSET DATE: Several years (about 2014)  SUBJECTIVE:   SUBJECTIVE STATEMENT: Pain gradually came on about 10 years ago, BIL although L sometimes worse than right. No recalled injury. Uses QC in community, although pt states she tends to stay at home most of the time due to pain. Mostly online shopping. Does endorse some occasional tingling in knees, mostly when knees are swollen. Reports a couple of  falls due to knees giving out. Difficulty with walking, transfers, and stair navigation Received injections in March with transient relief. States she was told she has "bone on bone" arthritis in both knees.   PERTINENT HISTORY: HTN, PE (in setting of covid, no issues since per pt) PAIN:  Are you having pain: 7/10 Location/description: B knees, L>R, anterior  Best-worst over past week: 0-10/10  - aggravating factors: walking <34min, stair navigation, transfers, floor transfers - Easing factors: rest, ice/heat, tylenol   PRECAUTIONS: hx  PE  WEIGHT BEARING RESTRICTIONS: No  FALLS:  Has patient fallen in last 6 months? Yes. Number of falls ~2 falls, legs gave out   LIVING ENVIRONMENT: 2 story home, with husband and family. 15 steps to second floor with rail, 1 STE Raised toilet, walk in shower and tub  QC occasionally   OCCUPATION: working from home for [employer], but may have to go back into office   PLOF: Independent  PATIENT GOALS: reduce pain, move more   NEXT MD VISIT: TBD  OBJECTIVE:   DIAGNOSTIC FINDINGS:  B knee XR 06/12/22, see EPIC for details. No acute issues.   PATIENT SURVEYS:  FOTO 31 current, 48 predicted  COGNITION: Overall cognitive status: Within functional limits for tasks assessed     SENSATION: Intact BLE  EDEMA:  Unable to assess given pt attire, she does endorse fluctuating swelling    PALPATION: Concordant TTP B patellar tendons and lateral joint lines  LOWER EXTREMITY ROM:     Active  Right eval Left eval  Hip flexion    Hip extension    Hip internal rotation    Hip external rotation    Knee extension    Knee flexion    (Blank rows = not tested) (Key: WFL = within functional limits not formally assessed, * = concordant pain, s = stiffness/stretching sensation, NT = not tested)  Comments:    LOWER EXTREMITY MMT:    MMT Right eval Left eval  Hip flexion 3+ 3+  Hip abduction (modified sitting) 4 4  Hip internal rotation    Hip external rotation    Knee flexion 3+ 3+  Knee extension 3+ 3+  Ankle dorsiflexion     (Blank rows = not tested) (Key: WFL = within functional limits not formally assessed, * = concordant pain, s = stiffness/stretching sensation, NT = not tested)  Comments:   LOWER EXTREMITY SPECIAL TESTS:  NT  FUNCTIONAL TESTS:  5xSTS: 21.69 sec heavy UE support, increase in B knee pain  GAIT: Distance walked: within clinic Assistive device utilized: None Level of assistance: Complete Independence Comments: antalgic gait L>R, wide BOS,  reduced gait speed/cadence   TODAY'S TREATMENT:                                                                                                                              [facility] Adult PT Treatment:  DATE: 08/16/22 Deferred given time constraints  PATIENT EDUCATION:  Education details: Pt education on PT impairments, prognosis, and POC. Informed consent. Rationale for interventions, role of PT, relevant anatomy/physiology Person educated: Patient Education method: Explanation, Demonstration, Tactile cues, Verbal cues Education comprehension: verbalized understanding, returned demonstration, verbal cues required, tactile cues required, and needs further education    HOME EXERCISE PROGRAM: TBD  ASSESSMENT:  CLINICAL IMPRESSION: Pt is a pleasant 52 year old woman who arrives to PT evaluation on this date for chronic B knee pain. Pt reports difficulty with ambulation, stair navigation, and transfers due to pain. During today's session pt demonstrates limitations in B knee strength/mobility, gait impairments, and 5xSTS time indicative of fall risk, all of which are likely limiting ability to perform aforementioned activities. No adverse events, transient increases in pain during examination. Recommend skilled PT to address aforementioned deficits to improve functional independence/tolerance. HEP deferred given time constraints with late check in. Pt departs today's session in no acute distress, all voiced questions/concerns addressed appropriately from PT perspective.    OBJECTIVE IMPAIRMENTS: Abnormal gait, decreased activity tolerance, decreased endurance, decreased mobility, difficulty walking, decreased ROM, decreased strength, improper body mechanics, and pain.   ACTIVITY LIMITATIONS: carrying, lifting, bending, standing, squatting, stairs, transfers, bathing, toileting, and locomotion level  PARTICIPATION LIMITATIONS: meal prep, cleaning, laundry,  shopping, community activity, and occupation  PERSONAL FACTORS: 3+ comorbidities: HTN, hx PE, depression  are also affecting patient's functional outcome.   REHAB POTENTIAL: Fair given chronicity  CLINICAL DECISION MAKING: Stable/uncomplicated  EVALUATION COMPLEXITY: Low   GOALS: Goals reviewed with patient? No  SHORT TERM GOALS: Target date: 09/14/2022 Pt will demonstrate appropriate understanding and performance of initially prescribed HEP in order to facilitate improved independence with management of symptoms.  Baseline: HEP provided on eval Goal status: INITIAL   2. Pt will score greater than or equal to 39 on FOTO in order to demonstrate improved perception of function due to symptoms.  Baseline: 31  Goal status: INITIAL    LONG TERM GOALS: Target date: 10/12/2022 Pt will score 48 or greater on FOTO in order to demonstrate improved perception of function due to symptoms.  Baseline: 31 Goal status: INITIAL  2.  Pt will report at least 50% decrease in overall pain levels in past week in order to facilitate improved tolerance to basic ADLs/mobility.   Baseline: 0-10/10  Goal status: INITIAL    3.  Pt will report/demonstrate ability to walk up to 10 min with LRAD and less than 2 pt increase in pain on NPS in order to improve tolerance to community navigation. Baseline: walking <3min at a time Goal status: INITIAL  4.  Pt will be able to perform 5xSTS in less than or equal to 14sec in order to demonstrate reduced fall risk and improved functional independence (MCID 5xSTS = 2.3 sec). Baseline: 21 sec heavy UE support and B knee pain Goal status: INITIAL   5. Pt will demo at least 4/5 global MMT throughout tested LE musculature to indicate improved functional strength.  Baseline: see MMT chart above  Goal status: INITIAL   6. Pt will be able to safely navigate up to 15 stairs with LRAD and one rail, less than 2 pt increase in resting pain in order to facilitate improved safety  w/ home navigation.  Baseline: significant pain with navigating stairs  Goal status: INITIAL    PLAN:  PT FREQUENCY: 1-2x/week  PT DURATION: 8 weeks  PLANNED INTERVENTIONS: Therapeutic exercises, Therapeutic activity, Neuromuscular re-education, Balance training,  Gait training, Patient/Family education, Self Care, Joint mobilization, Stair training, Aquatic Therapy, Dry Needling, Electrical stimulation, Cryotherapy, Moist heat, Taping, Manual therapy, and Re-evaluation  PLAN FOR NEXT SESSION: establish HEP. Gentle hamstring/quad activation, hip activation. Manual/modalities as indicated PRN   Ashley Murrain PT, DPT 08/17/2022 2:55 PM    Discharge addendum:    PHYSICAL THERAPY DISCHARGE SUMMARY  Visits from Start of Care: 1  Current functional level related to goals / functional outcomes: Unable to assess   Remaining deficits: Unable to assess   Education / Equipment: Unable to assess   Patient goals were  unable to be assessed . Patient is being discharged due to not returning since the last visit.    Ashley Murrain PT, DPT 10/25/2022 3:36 PM

## 2022-08-16 DIAGNOSIS — F429 Obsessive-compulsive disorder, unspecified: Secondary | ICD-10-CM | POA: Diagnosis not present

## 2022-08-17 ENCOUNTER — Ambulatory Visit: Payer: BC Managed Care – PPO | Attending: Physician Assistant | Admitting: Physical Therapy

## 2022-08-17 ENCOUNTER — Encounter: Payer: Self-pay | Admitting: Physical Therapy

## 2022-08-17 ENCOUNTER — Other Ambulatory Visit: Payer: Self-pay

## 2022-08-17 DIAGNOSIS — M25561 Pain in right knee: Secondary | ICD-10-CM | POA: Diagnosis not present

## 2022-08-17 DIAGNOSIS — R2689 Other abnormalities of gait and mobility: Secondary | ICD-10-CM | POA: Diagnosis not present

## 2022-08-17 DIAGNOSIS — G8929 Other chronic pain: Secondary | ICD-10-CM

## 2022-08-17 DIAGNOSIS — M25562 Pain in left knee: Secondary | ICD-10-CM | POA: Diagnosis not present

## 2022-08-17 DIAGNOSIS — M6281 Muscle weakness (generalized): Secondary | ICD-10-CM

## 2022-08-17 DIAGNOSIS — M1711 Unilateral primary osteoarthritis, right knee: Secondary | ICD-10-CM | POA: Insufficient documentation

## 2022-08-17 DIAGNOSIS — M1712 Unilateral primary osteoarthritis, left knee: Secondary | ICD-10-CM | POA: Insufficient documentation

## 2022-08-24 DIAGNOSIS — Z1211 Encounter for screening for malignant neoplasm of colon: Secondary | ICD-10-CM | POA: Diagnosis not present

## 2022-08-31 LAB — COLOGUARD: COLOGUARD: NEGATIVE

## 2022-09-12 DIAGNOSIS — F429 Obsessive-compulsive disorder, unspecified: Secondary | ICD-10-CM | POA: Diagnosis not present

## 2022-09-19 ENCOUNTER — Telehealth: Payer: Self-pay | Admitting: Sports Medicine

## 2022-09-19 NOTE — Telephone Encounter (Signed)
Patient requesting handicap placard 3 copies for her vehicles

## 2022-09-24 ENCOUNTER — Telehealth: Payer: Self-pay | Admitting: Physician Assistant

## 2022-09-24 NOTE — Telephone Encounter (Signed)
Handicap placard form completed and put at front desk for patient to pick up. I called patient and advised.

## 2022-09-24 NOTE — Telephone Encounter (Signed)
IC, spoke with patient regarding disability paperwork she dropped off for [employer]. I advised patient that she will need to schedule an appointment as she has not been seen since March 2024 and dictation does not reflect any work status and does state to follow up in 6 weeks and she hasn't been seen. She is asking to work from home and intermittent leave. Patient expressed understanding. She will schedule an appointment for followup on bil knee and to discuss her paperwork.

## 2022-09-26 DIAGNOSIS — F429 Obsessive-compulsive disorder, unspecified: Secondary | ICD-10-CM | POA: Diagnosis not present

## 2022-09-27 ENCOUNTER — Ambulatory Visit: Payer: BC Managed Care – PPO | Admitting: Physician Assistant

## 2022-09-27 ENCOUNTER — Encounter: Payer: Self-pay | Admitting: Physician Assistant

## 2022-09-27 DIAGNOSIS — M1711 Unilateral primary osteoarthritis, right knee: Secondary | ICD-10-CM

## 2022-09-27 DIAGNOSIS — M1712 Unilateral primary osteoarthritis, left knee: Secondary | ICD-10-CM

## 2022-09-27 DIAGNOSIS — M170 Bilateral primary osteoarthritis of knee: Secondary | ICD-10-CM

## 2022-09-27 NOTE — Progress Notes (Deleted)
Office Visit Note   Patient: Karen [name]           Date of Birth: March 05, 1971           MRN: [id number] Visit Date: 09/27/2022              Requested by: Georganna Skeans, MD [address] [place],  [place] [address] PCP: Georganna Skeans, MD   Assessment & Plan: Visit Diagnoses: No diagnosis found.  Plan: ***  Follow-Up Instructions: Return if symptoms worsen or fail to improve.   Orders:  No orders of the defined types were placed in this encounter.  No orders of the defined types were placed in this encounter.     Procedures: No procedures performed   Clinical Data: No additional findings.   Subjective: Chief Complaint  Patient presents with   Right Knee - Pain   Left Knee - Pain    HPI  Review of Systems   Objective: Vital Signs: There were no vitals taken for this visit.  Physical Exam  Ortho Exam  Specialty Comments:  No specialty comments available.  Imaging: No results found.   PMFS History: Patient Active Problem List   Diagnosis Date Noted   OSA (obstructive sleep apnea) 10/26/2019   Nocturnal hypoxemia due to obesity 10/26/2019   Depression 07/29/2019   Chronic anticoagulation 07/29/2019   Nocturnal dyspnea 07/29/2019   History of 2019 novel coronavirus disease (COVID-19) 07/29/2019   Right leg pain 07/29/2019   Brain fog 07/29/2019   Acute pulmonary embolism (HCC) 06/26/2019   Incidental lung nodule 06/26/2019   Steatosis of liver 06/25/2018   Carpal tunnel syndrome of right wrist 02/10/2018   Essential hypertension 10/26/2016   History of hypertension 10/26/2016   Menorrhagia 02/22/2015   Morbid obesity (HCC) 11/10/2012   Past Medical History:  Diagnosis Date   Acute pulmonary embolism (HCC) 06/26/2019   Arthritis    Left knee   Carpal tunnel syndrome of right wrist 02/10/2018   Essential hypertension 10/26/2016   Family history of early CAD    Gallstone 06/25/2018   Hypertension    Incidental lung  nodule 06/26/2019   Menorrhagia 02/22/2015   Morbid obesity (HCC) 11/10/2012   BMI 63    Pain in right hand 01/27/2018   PONV (postoperative nausea and vomiting)    Right upper quadrant pain 06/25/2018   Steatosis of liver 06/25/2018    Family History  Problem Relation Age of Onset   Hyperlipidemia Father    Heart attack Father    Heart attack Other    Hypertension Other    Cancer Other    Stroke Other    Hypertension Mother    Parkinson's disease Mother     Past Surgical History:  Procedure Laterality Date   CARPAL TUNNEL RELEASE Right 04/04/2018   Procedure: CARPAL TUNNEL RELEASE;  Surgeon: Ranee Gosselin, MD;  Location: [facility];  Service: Orthopedics;  Laterality: Right;    CHOLECYSTECTOMY     gastic sleeve  2015   HYSTEROSCOPY WITH NOVASURE N/A 03/29/2015   Procedure: HYSTEROSCOPY WITH NOVASURE;  Surgeon: Dara Lords, MD;  Location: [facility];  Service: Gynecology;  Laterality: N/A;  to follow first case around 10:15am.  Requests one hour OR time.   INTRAUTERINE DEVICE INSERTION     X 2  Spontaneously extruded X 2.   2014/2015   TUBAL LIGATION  approximately 2000   as an interval procedure historically   Social History   Occupational  History   Not on file  Tobacco Use   Smoking status: Former   Smokeless tobacco: Never  Vaping Use   Vaping Use: Never used  Substance and Sexual Activity   Alcohol use: Yes    Comment: occas    Drug use: No   Sexual activity: Yes    Partners: Male    Birth control/protection: Surgical    Comment: BTL

## 2022-09-27 NOTE — Progress Notes (Signed)
Office Visit Note   Patient: Karen [name]           Date of Birth: [birth year]           MRN: [id number] Visit Date: 09/27/2022              Requested by: Georganna Skeans, MD [address] [place],  [place] [address] PCP: Georganna Skeans, MD  Chief Complaint  Patient presents with   Right Knee - Pain   Left Knee - Pain      HPI: Patient is a pleasant 52 year old woman who is a former patient of Dr. Prince Rome and is also seeing Dr. Shon Baton.  She has end-stage advanced tricompartmental arthritis of both of her knees.  She is working on losing weight so she can be a candidate for knee replacement.  She still continues to have difficulties.  She is here today for an updated note so she can still continue to work from home.  She says she can walk no more than 15 to 20 feet before her knees get very painful and give way on her.  She also has difficulty walking from her car into her building at work.  There are also stairs involved and she feels unstable on the stairs because of her arthritis and pain.  She does get her injections which helped some and give her tolerable moderate pain.  Assessment & Plan: Visit Diagnoses: Osteoarthritis bilateral knees  Plan: She has been working from home doing her full job.  Because of her disability with walking she can walk no more than 15 to 20 feet.  Cannot take stairs.  She has end-stage osteoarthritis of her knees.  She uses a cane to ambulate.  I will continue to recommend that she work from home and walk no more than 15 to 20 feet at a time.  Needs the ability to sit as needed.  Restrictions have not changed since last done by Dr. Prince Rome in 2020  Follow-Up Instructions: Return if symptoms worsen or fail to improve.   Ortho Exam  Patient is alert, oriented, no adenopathy, well-dressed, normal affect, normal respiratory effort. Bilateral knees no effusion no erythema.  She has grinding and pain on both the medial and lateral side.  She is  neurovascularly intact.  She ambulates with an antalgic gait and a cane.  Compartments are soft and nontender negative Homans' sign.  X-rays continue to show end-stage degenerative changes in all 3 compartments  Imaging: No results found. No images are attached to the encounter.  Labs: Lab Results  Component Value Date   HGBA1C 4.8 07/12/2022   HGBA1C 7.6 (H) 04/13/2013   HGBA1C 6.6 (H) 11/24/2012   CRP 26 (H) 07/29/2019   LABORGA Multiple bacterial morphotypes present, none 11/10/2012   LABORGA predominant. Suggest appropriate recollection if 11/10/2012   LABORGA clinically indicated. 11/10/2012     Lab Results  Component Value Date   ALBUMIN 3.9 07/12/2022   ALBUMIN 4.4 07/29/2019   ALBUMIN 4.1 06/25/2019    Lab Results  Component Value Date   MG 1.7 (L) 05/29/2013   MG 1.8 04/13/2013   Lab Results  Component Value Date   VD25OH 17.4 (L) 07/12/2022    No results found for: "PREALBUMIN"    Latest Ref Rng & Units 07/12/2022   11:47 AM 07/29/2019   11:11 AM 06/26/2019    4:21 AM  CBC EXTENDED  WBC 3.4 - 10.8 x10E3/uL 7.3  8.0  9.1   RBC  3.77 - 5.28 x10E6/uL 4.18  4.87  4.50   Hemoglobin 11.1 - 15.9 g/dL 78.2  95.6  21.3   HCT 34.0 - 46.6 % 36.7  40.8  39.9   Platelets 150 - 450 x10E3/uL 272  313  265   NEUT# 1.4 - 7.0 x10E3/uL 4.4  5.6    Lymph# 0.7 - 3.1 x10E3/uL 2.2  1.5       There is no height or weight on file to calculate BMI.  Orders:  No orders of the defined types were placed in this encounter.  No orders of the defined types were placed in this encounter.    Procedures: No procedures performed  Clinical Data: No additional findings.  ROS:  All other systems negative, except as noted in the HPI. Review of Systems  Objective: Vital Signs: There were no vitals taken for this visit.  Specialty Comments:  No specialty comments available.  PMFS History: Patient Active Problem List   Diagnosis Date Noted   OSA (obstructive sleep apnea)  10/26/2019   Nocturnal hypoxemia due to obesity 10/26/2019   Depression 07/29/2019   Chronic anticoagulation 07/29/2019   Nocturnal dyspnea 07/29/2019   History of 2019 novel coronavirus disease (COVID-19) 07/29/2019   Right leg pain 07/29/2019   Brain fog 07/29/2019   Acute pulmonary embolism (HCC) 06/26/2019   Incidental lung nodule 06/26/2019   Steatosis of liver 06/25/2018   Carpal tunnel syndrome of right wrist 02/10/2018   Essential hypertension 10/26/2016   History of hypertension 10/26/2016   Menorrhagia 02/22/2015   Morbid obesity (HCC) 11/10/2012   Past Medical History:  Diagnosis Date   Acute pulmonary embolism (HCC) 06/26/2019   Arthritis    Left knee   Carpal tunnel syndrome of right wrist 02/10/2018   Essential hypertension 10/26/2016   Family history of early CAD    Gallstone 06/25/2018   Hypertension    Incidental lung nodule 06/26/2019   Menorrhagia 02/22/2015   Morbid obesity (HCC) 11/10/2012   BMI 63    Pain in right hand 01/27/2018   PONV (postoperative nausea and vomiting)    Right upper quadrant pain 06/25/2018   Steatosis of liver 06/25/2018    Family History  Problem Relation Age of Onset   Hyperlipidemia Father    Heart attack Father    Heart attack Other    Hypertension Other    Cancer Other    Stroke Other    Hypertension Mother    Parkinson's disease Mother     Past Surgical History:  Procedure Laterality Date   CARPAL TUNNEL RELEASE Right 04/04/2018   Procedure: CARPAL TUNNEL RELEASE;  Surgeon: Ranee Gosselin, MD;  Location: [facility];  Service: Orthopedics;  Laterality: Right;    CHOLECYSTECTOMY     gastic sleeve  2015   HYSTEROSCOPY WITH NOVASURE N/A 03/29/2015   Procedure: HYSTEROSCOPY WITH NOVASURE;  Surgeon: Dara Lords, MD;  Location: [facility];  Service: Gynecology;  Laterality: N/A;  to follow first case around 10:15am.  Requests one hour OR time.   INTRAUTERINE DEVICE INSERTION     X 2  Spontaneously extruded X 2.    2014/2015   TUBAL LIGATION  approximately 2000   as an interval procedure historically   Social History   Occupational History   Not on file  Tobacco Use   Smoking status: Former   Smokeless tobacco: Never  Vaping Use   Vaping Use: Never used  Substance and Sexual Activity   Alcohol use: Yes  Comment: occas    Drug use: No   Sexual activity: Yes    Partners: Male    Birth control/protection: Surgical    Comment: BTL

## 2022-09-28 ENCOUNTER — Other Ambulatory Visit: Payer: Self-pay | Admitting: Family Medicine

## 2022-10-09 ENCOUNTER — Other Ambulatory Visit: Payer: Self-pay | Admitting: Family Medicine

## 2022-10-18 ENCOUNTER — Telehealth: Payer: Self-pay | Admitting: Family Medicine

## 2022-10-18 NOTE — Telephone Encounter (Signed)
Medication Refill - Medication: motion sickness patches  Has the patient contacted their pharmacy? No. Pt states she is taking a trip and needs the patches. Preferred Pharmacy (with phone number or street name): [id number] - [place], [place] - [address]  Has the patient been seen for an appointment in the last year OR does the patient have an upcoming appointment? Yes.    Agent: Please be advised that RX refills may take up to 3 business days. We ask that you follow-up with your pharmacy.

## 2022-10-22 ENCOUNTER — Other Ambulatory Visit: Payer: Self-pay | Admitting: Family Medicine

## 2022-10-22 ENCOUNTER — Telehealth: Payer: Self-pay | Admitting: Family Medicine

## 2022-10-22 MED ORDER — SCOPOLAMINE 1 MG/3DAYS TD PT72: 1.0000 | MEDICATED_PATCH | TRANSDERMAL | 0 refills | Status: AC

## 2022-10-22 NOTE — Telephone Encounter (Signed)
Medication Refill - Medication: motion sickness patches   Has the patient contacted their pharmacy? No. Patient states she is taking a trip in ONE WEEK and needs the patches for the trip, patient has already put one request in and has not hear anything*   Preferred Pharmacy (with phone number or street name): [id number] - [place], [place] - [address]    Has the patient been seen for an appointment in the last year OR does the patient have an upcoming appointment? Yes.  Last seen on 4.15.24      Patients call back #: [phone]

## 2022-10-22 NOTE — Telephone Encounter (Signed)
Patient called, left detailed VM per DPR that medication was sent to the pharmacy. Any questions call [facility] [address] or call the office back.

## 2022-11-06 ENCOUNTER — Encounter: Payer: Self-pay | Admitting: Sports Medicine

## 2022-11-06 ENCOUNTER — Ambulatory Visit: Payer: BC Managed Care – PPO | Admitting: Sports Medicine

## 2022-11-06 ENCOUNTER — Other Ambulatory Visit: Payer: Self-pay

## 2022-11-06 DIAGNOSIS — M25562 Pain in left knee: Secondary | ICD-10-CM

## 2022-11-06 DIAGNOSIS — M25561 Pain in right knee: Secondary | ICD-10-CM | POA: Diagnosis not present

## 2022-11-06 DIAGNOSIS — G8929 Other chronic pain: Secondary | ICD-10-CM | POA: Diagnosis not present

## 2022-11-06 DIAGNOSIS — M1712 Unilateral primary osteoarthritis, left knee: Secondary | ICD-10-CM

## 2022-11-06 DIAGNOSIS — M170 Bilateral primary osteoarthritis of knee: Secondary | ICD-10-CM

## 2022-11-06 DIAGNOSIS — M1711 Unilateral primary osteoarthritis, right knee: Secondary | ICD-10-CM

## 2022-11-06 MED ORDER — LIDOCAINE HCL 1 % IJ SOLN
2.0000 mL | INTRAMUSCULAR | Status: AC | PRN
Start: 2022-11-06 — End: 2022-11-06
  Administered 2022-11-06: 2 mL

## 2022-11-06 MED ORDER — BUPIVACAINE HCL 0.25 % IJ SOLN
2.0000 mL | INTRAMUSCULAR | Status: AC | PRN
Start: 2022-11-06 — End: 2022-11-06
  Administered 2022-11-06: 2 mL via INTRA_ARTICULAR

## 2022-11-06 MED ORDER — METHYLPREDNISOLONE ACETATE 40 MG/ML IJ SUSP
40.0000 mg | INTRAMUSCULAR | Status: AC | PRN
Start: 2022-11-06 — End: 2022-11-06
  Administered 2022-11-06: 40 mg via INTRA_ARTICULAR

## 2022-11-06 NOTE — Progress Notes (Signed)
Bilateral knee injection

## 2022-11-06 NOTE — Progress Notes (Signed)
Karen [name] - 53 y.o. female MRN [id number]  Date of birth: November 08, 1970  Office Visit Note: Visit Date: 11/06/2022 PCP: Georganna Skeans, MD Referred by: Georganna Skeans, MD  Subjective: Chief Complaint  Patient presents with   Right Knee - Pain   Left Knee - Pain   HPI: Karen [name] is a pleasant 52 y.o. female who presents today for bilateral knee pain with known osteoarthritis.  We did perform ultrasound-guided knee injections back in March of this year, this gave her good relief until the end of June.  Here over the last month or so her pain has been slightly exacerbated.  Was trying to do formalized physical therapy in the past, did have an eval on 08/17/22 but her insurance did not pay for physical therapy so she had to stop.  She had been working on weight loss to be a candidate for knee replacement in the future.  Patient did report a trip to [place] this upcoming Friday, shortly feel better by then.  Did discuss meloxicam medication prescription, she has not started this yet but is interested in having something to take this prescription based as needed.  Pertinent ROS were reviewed with the patient and found to be negative unless otherwise specified above in HPI.   Assessment & Plan: Visit Diagnoses:  1. Unilateral primary osteoarthritis, left knee   2. Unilateral primary osteoarthritis, right knee   3. Chronic pain of both knees    Plan: Impression is bilateral knee end-stage osteoarthritis, left greater than right in the medial tibiofemoral joint space.  She did get excellent relief from prior corticosteroid injection back in March, here over the last few weeks she has had an exacerbation of her chronic knee pain.  She is working on healthy weight loss as well.  Eventually may be a candidate for total knee arthroplasty.  Through shared decision-making, did elect to proceed with ultrasound-guided bilateral knee corticosteroid injection, patient tolerated well.  She will  use ice, anti-inflammatories for any postinjection pain.  She was previously prescribed meloxicam 7.5 mg, she has not picked this up yet.  She is interested in using a prescription based anti-inflammatories as needed, I am okay with her beginning meloxicam 7.5 mg once to twice daily as needed for the knees.  She will continue to work on healthy weight changes.  She will follow-up with me 3 months as needed, discussed role for infrequent injections as her pain requires.   Follow-up: Return in about 3 months (around 02/06/2023), or if symptoms worsen or fail to improve.   Meds & Orders: No orders of the defined types were placed in this encounter.   Orders Placed This Encounter  Procedures   Large Joint Inj: bilateral knee   US Guided Needle Placement - No Linked Charges     Procedures: Large Joint Inj: bilateral knee on 11/06/2022 1:57 PM Indications: pain Details: 22 G 1.5 in needle, ultrasound-guided superolateral approach Medications (Right): 2 mL lidocaine 1 %; 2 mL bupivacaine 0.25 %; 40 mg methylPREDNISolone acetate 40 MG/ML Medications (Left): 2 mL lidocaine 1 %; 2 mL bupivacaine 0.25 %; 40 mg methylPREDNISolone acetate 40 MG/ML Outcome: tolerated well, no immediate complications  Procedure: Ultrasound-guided Knee Injection, Right After discussion on risk/benefits/indication, informed verbal consent was obtained. A timeout was then performed. The patient was lying in supine on examination table with a small bolster underneath the affected knee for comfort. The patient's knee was prepped with Betadine and alcohol swabs. Utilizing ultrasound-guidance with the probe  in a transverse position, the patient's suprapatellar bursa was identified and the knee joint was subsequently injected intraarticularly with a mixture of 2:2:1 lidocaine:bupivicaine:depomedrol utilizing an in-plane visualization approach. Patient tolerated the procedure well without immediate complications.  Procedure:  Ultrasound-guided Knee Injection, Left After discussion on risk/benefits/indication, informed verbal consent was obtained. A timeout was then performed. The patient was lying in supine on examination table with a small bolster underneath the affected knee for comfort. The patient's knee was prepped with Betadine and alcohol swabs. Utilizing ultrasound-guidance with the probe in a transverse position, the patient's suprapatellar bursa was identified and the knee joint was subsequently injected intraarticularly with a mixture of 2:2:1 lidocaine:bupivicaine:depomedrol utilizing an in-plane visualization approach. Patient tolerated the procedure well without immediate complications.   Procedure, treatment alternatives, risks and benefits explained, specific risks discussed. Consent was given by the patient. Immediately prior to procedure a time out was called to verify the correct patient, procedure, equipment, support staff and site/side marked as required. Patient was prepped and draped in the usual sterile fashion.          Clinical History: No specialty comments available.  She reports that she has quit smoking. She has never used smokeless tobacco.  Recent Labs    07/12/22 1147  HGBA1C 4.8    Objective:   Vital Signs: There were no vitals taken for this visit.  Physical Exam  Gen: Well-appearing, in no acute distress; non-toxic CV: Well-perfused. Warm.  Resp: Breathing unlabored on room air; no wheezing. Psych: Fluid speech in conversation; appropriate affect; normal thought process Neuro: Sensation intact throughout. No gross coordination deficits.   Ortho Exam - Bilateral knees: Patient walks with an antalgic gait.  Right medial joint line TTP.  There is bilateral patellar crepitus with knee flexion and extension.  The left knee has a small effusion, no palpable effusion of the right knee.  Mild varus deformity upon standing.  Imaging:  - Previous imaging:  XR Knee 1-2 Views  Left 2 view radiographs of her left knee were obtained today.  She has varus  alignment with bone-on-bone degenerative changes and periarticular  osteophytes especially the of the medial compartment but also the  patellofemoral joint and lateral compartment.  No acute changes XR Knee 1-2 Views Right Three-view x-rays of her right knee were taken today.  She has advanced  tricompartmental arthritis with varus alignment.  Almost bone-on-bone in  the medial compartment bone-on-bone patellofemoral compartment no acute  changes  Past Medical/Family/Surgical/Social History: Medications & Allergies reviewed per EMR, new medications updated. Patient Active Problem List   Diagnosis Date Noted   OSA (obstructive sleep apnea) 10/26/2019   Nocturnal hypoxemia due to obesity 10/26/2019   Depression 07/29/2019   Chronic anticoagulation 07/29/2019   Nocturnal dyspnea 07/29/2019   History of 2019 novel coronavirus disease (COVID-19) 07/29/2019   Right leg pain 07/29/2019   Brain fog 07/29/2019   Acute pulmonary embolism (HCC) 06/26/2019   Incidental lung nodule 06/26/2019   Steatosis of liver 06/25/2018   Carpal tunnel syndrome of right wrist 02/10/2018   Essential hypertension 10/26/2016   History of hypertension 10/26/2016   Menorrhagia 02/22/2015   Morbid obesity (HCC) 11/10/2012   Past Medical History:  Diagnosis Date   Acute pulmonary embolism (HCC) 06/26/2019   Arthritis    Left knee   Carpal tunnel syndrome of right wrist 02/10/2018   Essential hypertension 10/26/2016   Family history of early CAD    Gallstone 06/25/2018   Hypertension    Incidental  lung nodule 06/26/2019   Menorrhagia 02/22/2015   Morbid obesity (HCC) 11/10/2012   BMI 63    Pain in right hand 01/27/2018   PONV (postoperative nausea and vomiting)    Right upper quadrant pain 06/25/2018   Steatosis of liver 06/25/2018   Family History  Problem Relation Age of Onset   Hyperlipidemia Father    Heart attack Father     Heart attack Other    Hypertension Other    Cancer Other    Stroke Other    Hypertension Mother    Parkinson's disease Mother    Past Surgical History:  Procedure Laterality Date   CARPAL TUNNEL RELEASE Right 04/04/2018   Procedure: CARPAL TUNNEL RELEASE;  Surgeon: Ranee Gosselin, MD;  Location: [facility];  Service: Orthopedics;  Laterality: Right;    CHOLECYSTECTOMY     gastic sleeve  2015   HYSTEROSCOPY WITH NOVASURE N/A 03/29/2015   Procedure: HYSTEROSCOPY WITH NOVASURE;  Surgeon: Dara Lords, MD;  Location: [facility];  Service: Gynecology;  Laterality: N/A;  to follow first case around 10:15am.  Requests one hour OR time.   INTRAUTERINE DEVICE INSERTION     X 2  Spontaneously extruded X 2.   2014/2015   TUBAL LIGATION  approximately 2000   as an interval procedure historically   Social History   Occupational History   Not on file  Tobacco Use   Smoking status: Former   Smokeless tobacco: Never  Vaping Use   Vaping status: Never Used  Substance and Sexual Activity   Alcohol use: Yes    Comment: occas    Drug use: No   Sexual activity: Yes    Partners: Male    Birth control/protection: Surgical    Comment: BTL

## 2022-11-28 ENCOUNTER — Ambulatory Visit: Payer: Self-pay | Admitting: *Deleted

## 2022-11-28 NOTE — Telephone Encounter (Signed)
  Chief Complaint: Cough Symptoms: Reports chronic dry cough "Since covid about 30 days ago." States she is considered "A long hauler." Also reports "Memory fog." Mild SOB with coughing, at times with rest. Frequency: S/P covid Pertinent Negatives: Patient denies States all other covid symptoms have resolved.  Disposition: [] ED /[] Urgent Care (no appt availability in office) / [x] Appointment(In office/virtual)/ []  [facility]/ [] Home Care/ [] Refused Recommended Disposition /[] [facility]/ []  Follow-up with PCP Additional Notes:  Pt requesting appt today "So I don't miss any more work." [employer], info provided, unsure she will follow disposition. Secured appt for 12/18/22 and placed on wait list.  Please advise if pt can be seen sooner as requested. Care advise provided, pt verbalizes understanding.  Reason for Disposition  Cough has been present for > 3 weeks  Answer Assessment - Initial Assessment Questions 1. ONSET: "When did the cough begin?"      With Covid 2. SEVERITY: "How bad is the cough today?"      Awake at HS, bad spells during day. 3. SPUTUM: "Describe the color of your sputum" (none, dry cough; clear, white, yellow, green)     Dry 4. HEMOPTYSIS: "Are you coughing up any blood?" If so ask: "How much?" (flecks, streaks, tablespoons, etc.)     no 5. DIFFICULTY BREATHING: "Are you having difficulty breathing?" If Yes, ask: "How bad is it?" (e.g., mild, moderate, severe)    - MILD: No SOB at rest, mild SOB with walking, speaks normally in sentences, can lie down, no retractions, pulse < 100.    - MODERATE: SOB at rest, SOB with minimal exertion and prefers to sit, cannot lie down flat, speaks in phrases, mild retractions, audible wheezing, pulse 100-120.    - SEVERE: Very SOB at rest, speaks in single words, struggling to breathe, sitting hunched forward, retractions, pulse > 120      Mild SOB 6. FEVER: "Do you have a fever?" If Yes, ask: "What is  your temperature, how was it measured, and when did it start?"     no 7. CARDIAC HISTORY: "Do you have any history of heart disease?" (e.g., heart attack, congestive heart failure)      *No Answer* 8. LUNG HISTORY: "Do you have any history of lung disease?"  (e.g., pulmonary embolus, asthma, emphysema)     *No Answer* 9. PE RISK FACTORS: "Do you have a history of blood clots?" (or: recent major surgery, recent prolonged travel, bedridden)     *No Answer* 10. OTHER SYMPTOMS: "Do you have any other symptoms?" (e.g., runny nose, wheezing, chest pain)     no  Protocols used: Cough - Acute Non-Productive-A-AH

## 2022-11-28 NOTE — Telephone Encounter (Signed)
Summary: experiencing memory fog since having Covid 30 days ago   **CRM from 11/27/2022 converted to Clinical Call  Reason for CRM: patient is calling back to see if someone would call her back as she is still experiencing memory fog since having Covid 30 days ago and need to be seen before September      Called patient back [phone] to review sx of memory issues after having covid 30 days ago . No answer, LVMTCB [phone]

## 2022-12-18 ENCOUNTER — Ambulatory Visit (INDEPENDENT_AMBULATORY_CARE_PROVIDER_SITE_OTHER): Payer: BC Managed Care – PPO | Admitting: Family Medicine

## 2022-12-18 ENCOUNTER — Encounter: Payer: Self-pay | Admitting: Family Medicine

## 2022-12-18 VITALS — BP 128/84 | HR 87 | Temp 98.8°F | Resp 18 | Ht 63.0 in | Wt 243.8 lb

## 2022-12-18 DIAGNOSIS — Z0289 Encounter for other administrative examinations: Secondary | ICD-10-CM | POA: Diagnosis not present

## 2022-12-18 DIAGNOSIS — U099 Post covid-19 condition, unspecified: Secondary | ICD-10-CM | POA: Diagnosis not present

## 2022-12-18 NOTE — Progress Notes (Unsigned)
Follow up Form filled out

## 2022-12-18 NOTE — Progress Notes (Unsigned)
Established Patient Office Visit  Subjective    Patient ID: Markea Tora Kindred, female    DOB: 08/04/1970  Age: 52 y.o. MRN: [id number]  CC:  Chief Complaint  Patient presents with   Follow-up    Form filled out    HPI Jahmia BASYA GRUENBERG presents for follow up of long covid Patient has had covid again in late June for the 9th time. She has a FMLA for long symptoms.   Outpatient Encounter Medications as of 12/18/2022  Medication Sig   albuterol (VENTOLIN HFA) 108 (90 Base) MCG/ACT inhaler TAKE 2 PUFFS BY MOUTH EVERY 6 HOURS AS NEEDED FOR WHEEZE OR SHORTNESS OF BREATH   amLODipine (NORVASC) 10 MG tablet TAKE 1 TABLET BY MOUTH EVERY DAY   apixaban (ELIQUIS) 5 MG TABS tablet Take 1 tablet (5 mg total) by mouth 2 (two) times daily.   butalbital-acetaminophen-caffeine (FIORICET) 50-325-40 MG tablet Take 50-235 tablets by mouth as needed.   Calcium Carbonate-Vitamin D (CALCIUM-VITAMIN D) 600-125 MG-UNIT TABS Take 1 tablet by mouth daily.   CREON 36000-114000 units CPEP capsule Take 1 capsule by mouth 3 (three) times daily with meals.   furosemide (LASIX) 40 MG tablet Take 1 tablet (40 mg total) by mouth once a week. Pt. Need to schedule an appt in order to receive future refills. Thank you. 2nd attempt.   gabapentin (NEURONTIN) 300 MG capsule START WITH 1 CAPSULE AT BEDTIME X 3 DAYS THEN 2 CAPS AT BEDTIME EVERY DAY   meclizine (ANTIVERT) 12.5 MG tablet Take 1 tablet (12.5 mg total) by mouth 3 (three) times daily as needed for dizziness.   megestrol (MEGACE) 40 MG tablet TAKE 1 TABLET (40 MG TOTAL) BY MOUTH 2 (TWO) TIMES DAILY. UNTIL BLEEDING STOPS   meloxicam (MOBIC) 7.5 MG tablet Take 1 tablet (7.5 mg total) by mouth daily.   mirtazapine (REMERON) 15 MG tablet TAKE 1 TABLET BY MOUTH EVERYDAY AT BEDTIME   Naltrexone-buPROPion HCl ER (CONTRAVE) 8-90 MG TB12 1TAB IN AM X1WK, 1 TAB TWICE DAILY X1WK, 2TABS IN AM 1 IN PM X1WK, 2 TABS TWICE DAILY   ondansetron (ZOFRAN) 4 MG tablet Take 1 tablet (4  mg total) by mouth every 8 (eight) hours as needed for nausea or vomiting.   pantoprazole (PROTONIX) 40 MG tablet TAKE 1 TABLET BY MOUTH EVERY DAY   potassium chloride (KLOR-CON) 10 MEQ tablet TAKE 1 TABLET EVERY MONDAY   rosuvastatin (CRESTOR) 10 MG tablet TAKE 1 TABLET BY MOUTH EVERY DAY   scopolamine (TRANSDERM-SCOP) 1 MG/3DAYS Place 1 patch (1.5 mg total) onto the skin every 3 (three) days.   sucralfate (CARAFATE) 1 g tablet Take 1 tablet (1 g total) by mouth 4 (four) times daily -  with meals and at bedtime.   traMADol (ULTRAM) 50 MG tablet TAKE 1 TABLET (50 MG TOTAL) BY MOUTH EVERY 6 (SIX) HOURS AS NEEDED FOR UP TO 5 DAYS.   triamcinolone cream (KENALOG) 0.5 % Apply 1 Application topically 2 (two) times daily as needed.   Vitamin D, Ergocalciferol, (DRISDOL) 1.25 MG (50000 UNIT) CAPS capsule Take 1 capsule (50,000 Units total) by mouth every 7 (seven) days.   No facility-administered encounter medications on file as of 12/18/2022.    Past Medical History:  Diagnosis Date   Acute pulmonary embolism (HCC) 06/26/2019   Arthritis    Left knee   Carpal tunnel syndrome of right wrist 02/10/2018   Essential hypertension 10/26/2016   Family history of early CAD    Gallstone 06/25/2018  Hypertension    Incidental lung nodule 06/26/2019   Menorrhagia 02/22/2015   Morbid obesity (HCC) 11/10/2012   BMI 63    Pain in right hand 01/27/2018   PONV (postoperative nausea and vomiting)    Right upper quadrant pain 06/25/2018   Steatosis of liver 06/25/2018    Past Surgical History:  Procedure Laterality Date   CARPAL TUNNEL RELEASE Right 04/04/2018   Procedure: CARPAL TUNNEL RELEASE;  Surgeon: Ranee Gosselin, MD;  Location: [facility];  Service: Orthopedics;  Laterality: Right;    CHOLECYSTECTOMY     gastic sleeve  2015   HYSTEROSCOPY WITH NOVASURE N/A 03/29/2015   Procedure: HYSTEROSCOPY WITH NOVASURE;  Surgeon: Dara Lords, MD;  Location: [facility];  Service: Gynecology;  Laterality: N/A;   to follow first case around 10:15am.  Requests one hour OR time.   INTRAUTERINE DEVICE INSERTION     X 2  Spontaneously extruded X 2.   2014/2015   TUBAL LIGATION  approximately 2000   as an interval procedure historically    Family History  Problem Relation Age of Onset   Hyperlipidemia Father    Heart attack Father    Heart attack Other    Hypertension Other    Cancer Other    Stroke Other    Hypertension Mother    Parkinson's disease Mother     Social History   Socioeconomic History   Marital status: Married    Spouse name: Not on file   Number of children: Not on file   Years of education: Not on file   Highest education level: Not on file  Occupational History   Not on file  Tobacco Use   Smoking status: Former   Smokeless tobacco: Never  Vaping Use   Vaping status: Never Used  Substance and Sexual Activity   Alcohol use: Yes    Comment: occas    Drug use: No   Sexual activity: Yes    Partners: Male    Birth control/protection: Surgical    Comment: BTL  Other Topics Concern   Not on file  Social History Narrative   Right handed   Lives with friend, two story home   Social Determinants of Health   Financial Resource Strain: Low Risk  (12/18/2022)   Overall Financial Resource Strain (CARDIA)    Difficulty of Paying Living Expenses: Not hard at all  Food Insecurity: No Food Insecurity (12/18/2022)   Hunger Vital Sign    Worried About Running Out of Food in the Last Year: Never true    Ran Out of Food in the Last Year: Never true  Transportation Needs: No Transportation Needs (12/18/2022)   PRAPARE - Administrator, Civil Service (Medical): No    Lack of Transportation (Non-Medical): No  Physical Activity: Inactive (12/18/2022)   Exercise Vital Sign    Days of Exercise per Week: 0 days    Minutes of Exercise per Session: 0 min  Stress: Stress Concern Present (12/18/2022)   [employer] of Occupational Health - Occupational Stress  Questionnaire    Feeling of Stress : To some extent  Social Connections: Moderately Integrated (12/18/2022)   Social Connection and Isolation Panel [NHANES]    Frequency of Communication with Friends and Family: More than three times a week    Frequency of Social Gatherings with Friends and Family: More than three times a week    Attends Religious Services: More than 4 times per year    Active Member of  Clubs or Organizations: No    Attends Banker Meetings: Never    Marital Status: Married  Catering manager Violence: Not At Risk (12/18/2022)   Humiliation, Afraid, Rape, and Kick questionnaire    Fear of Current or Ex-Partner: No    Emotionally Abused: No    Physically Abused: No    Sexually Abused: No    Review of Systems  Psychiatric/Behavioral:  Positive for memory loss.   All other systems reviewed and are negative.       Objective    BP 128/84 (BP Location: Right Arm, Patient Position: Sitting, Cuff Size: Large)   Pulse 87   Temp 98.8 F (37.1 C) (Oral)   Resp 18   Ht 5\' 3"  (1.6 m)   Wt 243 lb 12.8 oz (110.6 kg)   SpO2 94%   BMI 43.19 kg/m   Physical Exam Vitals and nursing note reviewed.  Constitutional:      General: She is not in acute distress. Cardiovascular:     Rate and Rhythm: Normal rate and regular rhythm.  Pulmonary:     Effort: Pulmonary effort is normal.     Breath sounds: Normal breath sounds.  Neurological:     General: No focal deficit present.     Mental Status: She is alert and oriented to person, place, and time.  Psychiatric:        Mood and Affect: Mood normal.        Behavior: Behavior normal.         Assessment & Plan:   COVID-19 long hauler -     Ambulatory referral to Neurology  Encounter for completion of form with patient   Patient referred for further eval/mgt of covid induced memory issues.   No follow-ups on file.   Tommie Raymond, MD

## 2022-12-19 ENCOUNTER — Encounter: Payer: Self-pay | Admitting: Family Medicine

## 2022-12-27 ENCOUNTER — Telehealth: Payer: Self-pay | Admitting: Family Medicine

## 2022-12-27 NOTE — Telephone Encounter (Signed)
Copied from CRM [id number]. Topic: General - Other >> Dec 27, 2022  2:04 PM Santiya F wrote: Reason for CRM: Maryelizabeth Rowan with [employer] [employer] is calling in because she received a fax from the office but says she's unclear on some things. Norelle says the writing is in cursive and she believes it says something about memory but she's unsure. Please follow up with Norelle to clarify.

## 2022-12-31 NOTE — Telephone Encounter (Signed)
Resent information with clear instruction

## 2023-01-09 ENCOUNTER — Other Ambulatory Visit: Payer: Self-pay | Admitting: Family Medicine

## 2023-01-10 ENCOUNTER — Other Ambulatory Visit: Payer: Self-pay | Admitting: Family Medicine

## 2023-01-10 NOTE — Telephone Encounter (Signed)
Requested Prescriptions  Pending Prescriptions Disp Refills   amLODipine (NORVASC) 10 MG tablet [Pharmacy Med Name: AMLODIPINE BESYLATE 10 MG TAB] 90 tablet 1    Sig: TAKE 1 TABLET BY MOUTH EVERY DAY     Cardiovascular: Calcium Channel Blockers 2 Passed - 01/10/2023  1:30 AM      Passed - Last BP in normal range    BP Readings from Last 1 Encounters:  12/18/22 128/84         Passed - Last Heart Rate in normal range    Pulse Readings from Last 1 Encounters:  12/18/22 87         Passed - Valid encounter within last 6 months    Recent Outpatient Visits           3 weeks ago COVID-19 long [facility], MD   6 months ago Annual physical exam   [place] [facility], MD   1 year ago COVID-19 long hauler manifesting chronic anxiety   [place] [facility], MD   1 year ago Essential hypertension   [place] [facility], MD   2 years ago Lung nodule   [facility], Gildardo Pounds, NP

## 2023-01-16 ENCOUNTER — Telehealth: Payer: Self-pay | Admitting: Family Medicine

## 2023-01-16 NOTE — Telephone Encounter (Signed)
Patient need an appt for this medication

## 2023-01-16 NOTE — Telephone Encounter (Signed)
Copied from CRM [id number]. Topic: General - Other >> Jan 16, 2023 10:09 AM Phill Myron wrote: Ms Karapetyan would like to request the medication Mounjaro, her knee surgeon suggested this RX because she needs to lose 30 pounds before knee surgery. Also she would like to discuss revising her employment job accommodations (  days off, time allowed to work. Etc.)  Please advise

## 2023-01-21 NOTE — Telephone Encounter (Signed)
Patient stated this was discussed with provider about trying this medication for weight loss , patient doesn't understand why she needs to make another appointment when she just had one, please advise

## 2023-01-21 NOTE — Telephone Encounter (Signed)
Patient has been scheduled appointment with provider

## 2023-01-22 ENCOUNTER — Encounter: Payer: Self-pay | Admitting: Family Medicine

## 2023-01-22 ENCOUNTER — Encounter: Payer: Self-pay | Admitting: Orthopaedic Surgery

## 2023-01-22 ENCOUNTER — Ambulatory Visit: Payer: BC Managed Care – PPO | Admitting: Family Medicine

## 2023-01-22 ENCOUNTER — Ambulatory Visit: Payer: BC Managed Care – PPO | Admitting: Orthopaedic Surgery

## 2023-01-22 ENCOUNTER — Telehealth: Payer: Self-pay | Admitting: Physician Assistant

## 2023-01-22 ENCOUNTER — Telehealth: Payer: Self-pay | Admitting: Family Medicine

## 2023-01-22 VITALS — BP 127/80 | HR 90 | Temp 98.6°F | Resp 16 | Ht 63.0 in | Wt 246.6 lb

## 2023-01-22 DIAGNOSIS — Z6841 Body Mass Index (BMI) 40.0 and over, adult: Secondary | ICD-10-CM

## 2023-01-22 DIAGNOSIS — Z7689 Persons encountering health services in other specified circumstances: Secondary | ICD-10-CM | POA: Diagnosis not present

## 2023-01-22 DIAGNOSIS — L209 Atopic dermatitis, unspecified: Secondary | ICD-10-CM | POA: Diagnosis not present

## 2023-01-22 DIAGNOSIS — Z23 Encounter for immunization: Secondary | ICD-10-CM

## 2023-01-22 DIAGNOSIS — E66813 Obesity, class 3: Secondary | ICD-10-CM

## 2023-01-22 DIAGNOSIS — Z1231 Encounter for screening mammogram for malignant neoplasm of breast: Secondary | ICD-10-CM | POA: Diagnosis not present

## 2023-01-22 DIAGNOSIS — G5602 Carpal tunnel syndrome, left upper limb: Secondary | ICD-10-CM

## 2023-01-22 MED ORDER — ZEPBOUND 2.5 MG/0.5ML ~~LOC~~ SOAJ: 2.5000 mg | SUBCUTANEOUS | 0 refills | Status: DC

## 2023-01-22 MED ORDER — TRIAMCINOLONE ACETONIDE 0.5 % EX CREA: 1.0000 | TOPICAL_CREAM | Freq: Two times a day (BID) | CUTANEOUS | 0 refills | Status: DC | PRN

## 2023-01-22 NOTE — Telephone Encounter (Signed)
Pt would like a [employer] handicap placard if possible please advise pt stated Dr. Prince Rome did her last one and it was good for 4 years

## 2023-01-22 NOTE — Progress Notes (Signed)
Office Visit Note   Patient: Karen [name]           Date of Birth: 1971-01-21           MRN: [id number] Visit Date: 01/22/2023              Requested by: Georganna Skeans, MD [address] [place],  [place] [address] PCP: Georganna Skeans, MD   Assessment & Plan: Visit Diagnoses:  1. Carpal tunnel syndrome, left upper limb     Plan: Impression is left carpal tunnel syndrome.  Today, we discussed obtaining a new nerve conduction study to assess for severity.  I provided her with a removable wrist splint.  She will follow-up with [place] following the nerve conduction study.  Call with concerns or questions.  Follow-Up Instructions: Return for f/u after NCS.   Orders:  No orders of the defined types were placed in this encounter.  No orders of the defined types were placed in this encounter.     Procedures: No procedures performed   Clinical Data: No additional findings.   Subjective: Chief Complaint  Patient presents with   Left Wrist - Pain    HPI patient is a pleasant 52 year old right-hand-dominant female who comes in today with left wrist paresthesias for the past few years which have progressively worsened.  She notes that she types all day.  She denies any symptoms to the neck or arm itself.  She has started to experience some weakness to the left hand.  She frequently wakes up shaking her hands at night.  The paresthesias she has are primarily to the index and long fingers.  She tells me she underwent nerve conduction study several years ago which showed bilateral carpal tunnel syndrome.  She underwent surgery for the right and is done well there.  She thinks she underwent carpal tunnel injection to the left years ago as well with good relief.  Review of Systems as detailed in HPI.  All others reviewed and are negative.   Objective: Vital Signs: There were no vitals taken for this visit.  Physical Exam well-developed well-nourished female no acute  distress.  Alert and oriented x 3.  Ortho Exam left wrist exam: Negative Phalen.  Positive Tinel at the wrist.  No thenar atrophy.  Decree sensation to the index, long and ring fingers.  Specialty Comments:  No specialty comments available.  Imaging: No new imaging   PMFS History: Patient Active Problem List   Diagnosis Date Noted   OSA (obstructive sleep apnea) 10/26/2019   Nocturnal hypoxemia due to obesity 10/26/2019   Depression 07/29/2019   Chronic anticoagulation 07/29/2019   Nocturnal dyspnea 07/29/2019   History of 2019 novel coronavirus disease (COVID-19) 07/29/2019   Right leg pain 07/29/2019   Brain fog 07/29/2019   Acute pulmonary embolism (HCC) 06/26/2019   Incidental lung nodule 06/26/2019   Steatosis of liver 06/25/2018   Carpal tunnel syndrome of right wrist 02/10/2018   Essential hypertension 10/26/2016   History of hypertension 10/26/2016   Menorrhagia 02/22/2015   Morbid obesity (HCC) 11/10/2012   Past Medical History:  Diagnosis Date   Acute pulmonary embolism (HCC) 06/26/2019   Arthritis    Left knee   Carpal tunnel syndrome of right wrist 02/10/2018   Essential hypertension 10/26/2016   Family history of early CAD    Gallstone 06/25/2018   Hypertension    Incidental lung nodule 06/26/2019   Menorrhagia 02/22/2015   Morbid obesity (HCC) 11/10/2012   BMI  63    Pain in right hand 01/27/2018   PONV (postoperative nausea and vomiting)    Right upper quadrant pain 06/25/2018   Steatosis of liver 06/25/2018    Family History  Problem Relation Age of Onset   Hyperlipidemia Father    Heart attack Father    Heart attack Other    Hypertension Other    Cancer Other    Stroke Other    Hypertension Mother    Parkinson's disease Mother     Past Surgical History:  Procedure Laterality Date   CARPAL TUNNEL RELEASE Right 04/04/2018   Procedure: CARPAL TUNNEL RELEASE;  Surgeon: Ranee Gosselin, MD;  Location: [facility];  Service: Orthopedics;  Laterality: Right;     CHOLECYSTECTOMY     gastic sleeve  2015   HYSTEROSCOPY WITH NOVASURE N/A 03/29/2015   Procedure: HYSTEROSCOPY WITH NOVASURE;  Surgeon: Dara Lords, MD;  Location: [facility];  Service: Gynecology;  Laterality: N/A;  to follow first case around 10:15am.  Requests one hour OR time.   INTRAUTERINE DEVICE INSERTION     X 2  Spontaneously extruded X 2.   2014/2015   TUBAL LIGATION  approximately 2000   as an interval procedure historically   Social History   Occupational History   Not on file  Tobacco Use   Smoking status: Former   Smokeless tobacco: Never  Vaping Use   Vaping status: Never Used  Substance and Sexual Activity   Alcohol use: Yes    Comment: occas    Drug use: No   Sexual activity: Yes    Partners: Male    Birth control/protection: Surgical    Comment: BTL

## 2023-01-22 NOTE — Telephone Encounter (Signed)
Copied from CRM [id number]. Topic: General - Inquiry >> Jan 22, 2023  4:20 PM Karen [name] wrote: Patient states that her handicap placard is expiring this month and she would like to renew it as permanent. Please advise.

## 2023-01-22 NOTE — Telephone Encounter (Signed)
I called and talked to the pt. She stated will reach out to her pcp

## 2023-01-23 ENCOUNTER — Encounter: Payer: Self-pay | Admitting: Family Medicine

## 2023-01-23 NOTE — Progress Notes (Signed)
Established Patient Office Visit  Subjective    Patient ID: Karen [name], female    DOB: [birth year]  Age: 52 y.o. MRN: [id number]  CC:  Chief Complaint  Patient presents with   Follow-up    Weight loss medication, paper work    HPI Karen [name] presents for follow up of chronic med issues.   Outpatient Encounter Medications as of 01/22/2023  Medication Sig   albuterol (VENTOLIN HFA) 108 (90 Base) MCG/ACT inhaler TAKE 2 PUFFS BY MOUTH EVERY 6 HOURS AS NEEDED FOR WHEEZE OR SHORTNESS OF BREATH   amLODipine (NORVASC) 10 MG tablet TAKE 1 TABLET BY MOUTH EVERY DAY   apixaban (ELIQUIS) 5 MG TABS tablet Take 1 tablet (5 mg total) by mouth 2 (two) times daily.   butalbital-acetaminophen-caffeine (FIORICET) 50-325-40 MG tablet Take 50-235 tablets by mouth as needed.   Calcium Carbonate-Vitamin D (CALCIUM-VITAMIN D) 600-125 MG-UNIT TABS Take 1 tablet by mouth daily.   CREON 36000-114000 units CPEP capsule Take 1 capsule by mouth 3 (three) times daily with meals.   furosemide (LASIX) 40 MG tablet Take 1 tablet (40 mg total) by mouth once a week. Pt. Need to schedule an appt in order to receive future refills. Thank you. 2nd attempt.   gabapentin (NEURONTIN) 300 MG capsule START WITH 1 CAPSULE AT BEDTIME X 3 DAYS THEN 2 CAPS AT BEDTIME EVERY DAY   meclizine (ANTIVERT) 12.5 MG tablet Take 1 tablet (12.5 mg total) by mouth 3 (three) times daily as needed for dizziness.   megestrol (MEGACE) 40 MG tablet TAKE 1 TABLET (40 MG TOTAL) BY MOUTH 2 (TWO) TIMES DAILY. UNTIL BLEEDING STOPS   meloxicam (MOBIC) 7.5 MG tablet Take 1 tablet (7.5 mg total) by mouth daily.   mirtazapine (REMERON) 15 MG tablet TAKE 1 TABLET BY MOUTH EVERYDAY AT BEDTIME   Naltrexone-buPROPion HCl ER (CONTRAVE) 8-90 MG TB12 1TAB IN AM X1WK, 1 TAB TWICE DAILY X1WK, 2TABS IN AM 1 IN PM X1WK, 2 TABS TWICE DAILY   ondansetron (ZOFRAN) 4 MG tablet Take 1 tablet (4 mg total) by mouth every 8 (eight) hours as needed for nausea or  vomiting.   pantoprazole (PROTONIX) 40 MG tablet TAKE 1 TABLET BY MOUTH EVERY DAY   potassium chloride (KLOR-CON) 10 MEQ tablet TAKE 1 TABLET EVERY MONDAY   rosuvastatin (CRESTOR) 10 MG tablet TAKE 1 TABLET BY MOUTH EVERY DAY   scopolamine (TRANSDERM-SCOP) 1 MG/3DAYS Place 1 patch (1.5 mg total) onto the skin every 3 (three) days.   sucralfate (CARAFATE) 1 g tablet Take 1 tablet (1 g total) by mouth 4 (four) times daily -  with meals and at bedtime.   tirzepatide (ZEPBOUND) 2.5 MG/0.5ML Pen Inject 2.5 mg into the skin once a week.   traMADol (ULTRAM) 50 MG tablet TAKE 1 TABLET (50 MG TOTAL) BY MOUTH EVERY 6 (SIX) HOURS AS NEEDED FOR UP TO 5 DAYS.   Vitamin D, Ergocalciferol, (DRISDOL) 1.25 MG (50000 UNIT) CAPS capsule Take 1 capsule (50,000 Units total) by mouth every 7 (seven) days.   [DISCONTINUED] triamcinolone cream (KENALOG) 0.5 % Apply 1 Application topically 2 (two) times daily as needed.   triamcinolone cream (KENALOG) 0.5 % Apply 1 Application topically 2 (two) times daily as needed.   No facility-administered encounter medications on file as of 01/22/2023.    Past Medical History:  Diagnosis Date   Acute pulmonary embolism (HCC) 06/26/2019   Arthritis    Left knee   Carpal tunnel syndrome of right wrist 02/10/2018  Essential hypertension 10/26/2016   Family history of early CAD    Gallstone 06/25/2018   Hypertension    Incidental lung nodule 06/26/2019   Menorrhagia 02/22/2015   Morbid obesity (HCC) 11/10/2012   BMI 63    Pain in right hand 01/27/2018   PONV (postoperative nausea and vomiting)    Right upper quadrant pain 06/25/2018   Steatosis of liver 06/25/2018    Past Surgical History:  Procedure Laterality Date   CARPAL TUNNEL RELEASE Right 04/04/2018   Procedure: CARPAL TUNNEL RELEASE;  Surgeon: Ranee Gosselin, MD;  [facility]: [facility];  Service: Orthopedics;  Laterality: Right;    CHOLECYSTECTOMY     gastic sleeve  2015   HYSTEROSCOPY WITH NOVASURE N/A 03/29/2015    Procedure: HYSTEROSCOPY WITH NOVASURE;  Surgeon: Dara Lords, MD;  [facility]: [facility];  Service: Gynecology;  Laterality: N/A;  to follow first case around 10:15am.  Requests one hour OR time.   INTRAUTERINE DEVICE INSERTION     X 2  Spontaneously extruded X 2.   2014/2015   TUBAL LIGATION  approximately 2000   as an interval procedure historically    Family History  Problem Relation Age of Onset   Hyperlipidemia Father    Heart attack Father    Heart attack Other    Hypertension Other    Cancer Other    Stroke Other    Hypertension Mother    Parkinson's disease Mother     Social History   Socioeconomic History   Marital status: Married    Spouse name: Not on file   Number of children: Not on file   Years of education: Not on file   Highest education level: Not on file  Occupational History   Not on file  Tobacco Use   Smoking status: Former   Smokeless tobacco: Never  Vaping Use   Vaping status: Never Used  Substance and Sexual Activity   Alcohol use: Yes    Comment: occas    Drug use: No   Sexual activity: Yes    Partners: Male    Birth control/protection: Surgical    Comment: BTL  Other Topics Concern   Not on file  Social History Narrative   Right handed   Lives with friend, two story home   Social Determinants of Health   Financial Resource Strain: Low Risk  (12/18/2022)   Overall Financial Resource Strain (CARDIA)    Difficulty of Paying Living Expenses: Not hard at all  Food Insecurity: No Food Insecurity (12/18/2022)   Hunger Vital Sign    Worried About Running Out of Food in the Last Year: Never true    Ran Out of Food in the Last Year: Never true  Transportation Needs: No Transportation Needs (12/18/2022)   PRAPARE - Administrator, Civil Service (Medical): No    Lack of Transportation (Non-Medical): No  Physical Activity: Inactive (12/18/2022)   Exercise Vital Sign    Days of Exercise per Week: 0 days    Minutes of Exercise  per Session: 0 min  Stress: Stress Concern Present (12/18/2022)   [employer] of Occupational Health - Occupational Stress Questionnaire    Feeling of Stress : To some extent  Social Connections: Moderately Integrated (12/18/2022)   Social Connection and Isolation Panel [NHANES]    Frequency of Communication with Friends and Family: More than three times a week    Frequency of Social Gatherings with Friends and Family: More than three times a week  Attends Religious Services: More than 4 times per year    Active Member of Clubs or Organizations: No    Attends Banker Meetings: Never    Marital Status: Married  Catering manager Violence: Not At Risk (12/18/2022)   Humiliation, Afraid, Rape, and Kick questionnaire    Fear of Current or Ex-Partner: No    Emotionally Abused: No    Physically Abused: No    Sexually Abused: No    Review of Systems  Skin:  Positive for rash.  All other systems reviewed and are negative.       Objective    BP 127/80 (BP Location: Right Arm, Patient Position: Sitting, Cuff Size: Large)   Pulse 90   Temp 98.6 F (37 C) (Oral)   Resp 16   Ht 5\' 3"  (1.6 m)   Wt 246 lb 9.6 oz (111.9 kg)   SpO2 94%   BMI 43.68 kg/m   Physical Exam Vitals and nursing note reviewed.  Constitutional:      General: She is not in acute distress. Cardiovascular:     Rate and Rhythm: Normal rate and regular rhythm.  Pulmonary:     Effort: Pulmonary effort is normal.     Breath sounds: Normal breath sounds.  Skin:    Findings: Rash present.  Neurological:     General: No focal deficit present.     Mental Status: She is alert and oriented to person, place, and time.         Assessment & Plan:   Encounter for weight management  Class 3 severe obesity due to excess calories without serious comorbidity with body mass index (BMI) of 40.0 to 44.9 in adult [facility])  Atopic dermatitis, unspecified type  Encounter for screening mammogram for  malignant neoplasm of breast -     3D Screening Mammogram, Left and Right; Future  Encounter for immunization -     Flu vaccine trivalent PF, 6mos and older(Flulaval,Afluria,Fluarix,Fluzone)  Other orders -     Zepbound; Inject 2.5 mg into the skin once a week.  Dispense: 2 mL; Refill: 0 -     Triamcinolone Acetonide; Apply 1 Application topically 2 (two) times daily as needed.  Dispense: 454 g; Refill: 0     Return in about 4 weeks (around 02/19/2023) for follow up.   Tommie Raymond, MD

## 2023-01-24 ENCOUNTER — Telehealth: Payer: Self-pay | Admitting: Family Medicine

## 2023-01-24 NOTE — Telephone Encounter (Signed)
Patient dropped off document  [employer] [place] , to be filled out by provider. Patient requested to send it back via Call Patient to pick up within 7-days. Document is located in providers tray at front office.Please advise at Mobile [phone] (mobile)

## 2023-01-25 ENCOUNTER — Telehealth: Payer: BC Managed Care – PPO | Admitting: Family Medicine

## 2023-01-25 NOTE — Telephone Encounter (Signed)
Form has been place in provider box to fill out

## 2023-01-25 NOTE — Telephone Encounter (Signed)
Forms are in provider folder to be filled out

## 2023-01-25 NOTE — Telephone Encounter (Addendum)
Patient has called and states her pharmacy stated she needs a Pre Authorization for medication tirzepatide (ZEPBOUND) 2.5 MG/0.5ML Pen and wanted patient to call PCP and give phone #[phone] for PCP to call for pre authorization. Please advise. Patients callback # [phone]   *Patient would like a call back to explain exactly how pre authorizations work on clinical end as well. Explained to patient typically Pre-Authorizations go through [employer] and can take up to several days for the authorizations to be approved through insurance, but she would like a more thorough explanation.

## 2023-01-25 NOTE — Telephone Encounter (Signed)
PA sent to to cover my meds

## 2023-01-25 NOTE — Telephone Encounter (Signed)
Patient was call and explain how PA works. Patient understood.

## 2023-01-28 ENCOUNTER — Other Ambulatory Visit: Payer: Self-pay

## 2023-01-30 ENCOUNTER — Other Ambulatory Visit: Payer: Self-pay

## 2023-01-31 ENCOUNTER — Other Ambulatory Visit: Payer: Self-pay | Admitting: Family Medicine

## 2023-01-31 ENCOUNTER — Other Ambulatory Visit: Payer: Self-pay | Admitting: Physician Assistant

## 2023-02-01 ENCOUNTER — Ambulatory Visit: Payer: BC Managed Care – PPO | Admitting: Physical Medicine and Rehabilitation

## 2023-02-01 DIAGNOSIS — R202 Paresthesia of skin: Secondary | ICD-10-CM

## 2023-02-01 DIAGNOSIS — M542 Cervicalgia: Secondary | ICD-10-CM

## 2023-02-01 DIAGNOSIS — M79641 Pain in right hand: Secondary | ICD-10-CM | POA: Diagnosis not present

## 2023-02-01 DIAGNOSIS — R29898 Other symptoms and signs involving the musculoskeletal system: Secondary | ICD-10-CM

## 2023-02-01 NOTE — Progress Notes (Unsigned)
Functional Pain Scale - descriptive words and definitions  Distressing (6)    Pain is present/unable to complete most ADLs limited by pain/sleep is difficult and active distraction is only marginal. Moderate range order  Average Pain 6  LUE NCS, pain, numbness and tingling in left arm. She had difficultly grasping and holding. HX of right carpal tunnel surgery.

## 2023-02-05 ENCOUNTER — Telehealth: Payer: Self-pay | Admitting: Physician Assistant

## 2023-02-05 ENCOUNTER — Encounter: Payer: Self-pay | Admitting: Physical Medicine and Rehabilitation

## 2023-02-05 ENCOUNTER — Telehealth: Payer: Self-pay | Admitting: Family Medicine

## 2023-02-05 NOTE — Telephone Encounter (Signed)
Copied from CRM [id number]. Topic: General - Other >> Feb 04, 2023  4:52 PM Turkey B wrote: Reason for CRM: pt called in , checking status of accommodation paperwork, says was in providers box since Oct 18 also, trying to get handicapped plackard, renewed.

## 2023-02-05 NOTE — Telephone Encounter (Signed)
Pt called in stating she was recently out of work due to a flare up with Fluid on the knees and Loletta Parish advised her to add 4-5 days out of work per month for flare ups on her disability paperwork so she can be covered when she is out of work , she was recently out of work earlier this month and they would like to be proactive just incase it happens again, we soul still have paperwork on file and it is a sectio on there were we can update the section for flare ups and we can just resign and date it and fax it back in.

## 2023-02-05 NOTE — Progress Notes (Unsigned)
Karen [name] - 52 y.o. female MRN [id number]  Date of birth: 1971-04-06  Office Visit Note: Visit Date: 02/01/2023 PCP: Georganna Skeans, MD Referred by: Tarry Kos, MD  Subjective: Chief Complaint  Patient presents with   Left Arm - Pain, Numbness, Tingling   HPI: Karen [name] is a 52 y.o. female who comes in todayHPI    ROS Otherwise per HPI.  Assessment & Plan: Visit Diagnoses:    ICD-10-CM   1. Paresthesia of skin  R20.2 NCV with EMG (electromyography)       Plan: Impression: Essentially NORMAL electrodiagnostic study of the left upper limb.  There is no significant electrodiagnostic evidence of nerve entrapment, brachial plexopathy or cervical radiculopathy.    As you know, purely sensory or demyelinating radiculopathies and chemical radiculitis may not be detected with this particular electrodiagnostic study. **This electrodiagnostic study cannot rule out small fiber polyneuropathy and dysesthesias from central pain syndromes such as stroke or central pain sensitization syndromes such as fibromyalgia.  Myotomal referral pain from trigger points is also not excluded.  Recommendations: 1.  Follow-up with referring physician. 2.  Continue current management of symptoms.  Meds & Orders: No orders of the defined types were placed in this encounter.   Orders Placed This Encounter  Procedures   NCV with EMG (electromyography)    Follow-up: No follow-ups on file.   Procedures: No procedures performed  EMG & NCV Findings: Evaluation of the left median (across palm) sensory nerve showed no response (Palm).  All remaining nerves (as indicated in the following tables) were within normal limits.    All examined muscles (as indicated in the following table) showed no evidence of electrical instability.    Impression: Essentially NORMAL electrodiagnostic study of the left upper limb.  There is no significant electrodiagnostic evidence of nerve entrapment,  brachial plexopathy or cervical radiculopathy.    As you know, purely sensory or demyelinating radiculopathies and chemical radiculitis may not be detected with this particular electrodiagnostic study. **This electrodiagnostic study cannot rule out small fiber polyneuropathy and dysesthesias from central pain syndromes such as stroke or central pain sensitization syndromes such as fibromyalgia.  Myotomal referral pain from trigger points is also not excluded.  Recommendations: 1.  Follow-up with referring physician. 2.  Continue current management of symptoms.  ___________________________ Naaman Plummer FAAPMR Board Certified, American Board of Physical Medicine and Rehabilitation    Nerve Conduction Studies Anti Sensory Summary Table   Stim Site NR Peak (ms) Norm Peak (ms) P-T Amp (V) Norm P-T Amp Site1 Site2 Delta-P (ms) Dist (cm) Vel (m/s) Norm Vel (m/s)  Left Median Acr Palm Anti Sensory (2nd Digit)  28.2C  Wrist    3.3 <3.6 35.5 >10 Wrist Palm  0.0    Palm *NR  <2.0          Left Radial Anti Sensory (Base 1st Digit)  29C  Wrist    2.0 <3.1 50.1  Wrist Base 1st Digit 2.0 0.0    Left Ulnar Anti Sensory (5th Digit)  28.9C  Wrist    3.5 <3.7 28.9 >15.0 Wrist 5th Digit 3.5 14.0 40 >38   Motor Summary Table   Stim Site NR Onset (ms) Norm Onset (ms) O-P Amp (mV) Norm O-P Amp Site1 Site2 Delta-0 (ms) Dist (cm) Vel (m/s) Norm Vel (m/s)  Left Median Motor (Abd Poll Brev)  29.1C  Wrist    3.3 <4.2 10.6 >5 Elbow Wrist 3.4 21.0 62 >50  Elbow    6.7  10.7         Left Ulnar Motor (Abd Dig Min)  29.3C  Wrist    3.1 <4.2 12.6 >3 B Elbow Wrist 2.8 19.5 70 >53  B Elbow    5.9  12.5  A Elbow B Elbow 1.4 11.0 79 >53  A Elbow    7.3  12.1          EMG   Side Muscle Nerve Root Ins Act Fibs Psw Amp Dur Poly Recrt Int Dennie Bible Comment  Left Abd Poll Brev Median C8-T1 Nml Nml Nml Nml Nml 0 Nml Nml   Left 1stDorInt Ulnar C8-T1 Nml Nml Nml Nml Nml 0 Nml Nml   Left PronatorTeres Median C6-7 Nml Nml  Nml Nml Nml 0 Nml Nml   Left Biceps Musculocut C5-6 Nml Nml Nml Nml Nml 0 Nml Nml   Left Deltoid Axillary C5-6 Nml Nml Nml Nml Nml 0 Nml Nml     Nerve Conduction Studies Anti Sensory Left/Right Comparison   Stim Site L Lat (ms) R Lat (ms) L-R Lat (ms) L Amp (V) R Amp (V) L-R Amp (%) Site1 Site2 L Vel (m/s) R Vel (m/s) L-R Vel (m/s)  Median Acr Palm Anti Sensory (2nd Digit)  28.2C  Wrist 3.3   35.5   Wrist Palm     Palm             Radial Anti Sensory (Base 1st Digit)  29C  Wrist 2.0   50.1   Wrist Base 1st Digit     Ulnar Anti Sensory (5th Digit)  28.9C  Wrist 3.5   28.9   Wrist 5th Digit 40     Motor Left/Right Comparison   Stim Site L Lat (ms) R Lat (ms) L-R Lat (ms) L Amp (mV) R Amp (mV) L-R Amp (%) Site1 Site2 L Vel (m/s) R Vel (m/s) L-R Vel (m/s)  Median Motor (Abd Poll Brev)  29.1C  Wrist 3.3   10.6   Elbow Wrist 62    Elbow 6.7   10.7         Ulnar Motor (Abd Dig Min)  29.3C  Wrist 3.1   12.6   B Elbow Wrist 70    B Elbow 5.9   12.5   A Elbow B Elbow 79    A Elbow 7.3   12.1            Waveforms:             Clinical History: No specialty comments available.   She reports that she has quit smoking. She has never used smokeless tobacco.  Recent Labs    07/12/22 1147  HGBA1C 4.8    Objective:  VS:  HT:    WT:   BMI:     BP:   HR: bpm  TEMP: ( )  RESP:  Physical Exam  Ortho Exam  Imaging: No results found.  Past Medical/Family/Surgical/Social History: Medications & Allergies reviewed per EMR, new medications updated. Patient Active Problem List   Diagnosis Date Noted   OSA (obstructive sleep apnea) 10/26/2019   Nocturnal hypoxemia due to obesity 10/26/2019   Depression 07/29/2019   Chronic anticoagulation 07/29/2019   Nocturnal dyspnea 07/29/2019   History of 2019 novel coronavirus disease (COVID-19) 07/29/2019   Right leg pain 07/29/2019   Brain fog 07/29/2019   Acute pulmonary embolism (HCC) 06/26/2019   Incidental lung nodule  06/26/2019   Steatosis of liver 06/25/2018   Carpal tunnel syndrome of right wrist 02/10/2018  Essential hypertension 10/26/2016   History of hypertension 10/26/2016   Menorrhagia 02/22/2015   Morbid obesity (HCC) 11/10/2012   Past Medical History:  Diagnosis Date   Acute pulmonary embolism (HCC) 06/26/2019   Arthritis    Left knee   Carpal tunnel syndrome of right wrist 02/10/2018   Essential hypertension 10/26/2016   Family history of early CAD    Gallstone 06/25/2018   Hypertension    Incidental lung nodule 06/26/2019   Menorrhagia 02/22/2015   Morbid obesity (HCC) 11/10/2012   BMI 63    Pain in right hand 01/27/2018   PONV (postoperative nausea and vomiting)    Right upper quadrant pain 06/25/2018   Steatosis of liver 06/25/2018   Family History  Problem Relation Age of Onset   Hyperlipidemia Father    Heart attack Father    Heart attack Other    Hypertension Other    Cancer Other    Stroke Other    Hypertension Mother    Parkinson's disease Mother    Past Surgical History:  Procedure Laterality Date   CARPAL TUNNEL RELEASE Right 04/04/2018   Procedure: CARPAL TUNNEL RELEASE;  Surgeon: Ranee Gosselin, MD;  Location: [facility];  Service: Orthopedics;  Laterality: Right;    CHOLECYSTECTOMY     gastic sleeve  2015   HYSTEROSCOPY WITH NOVASURE N/A 03/29/2015   Procedure: HYSTEROSCOPY WITH NOVASURE;  Surgeon: Dara Lords, MD;  Location: [facility];  Service: Gynecology;  Laterality: N/A;  to follow first case around 10:15am.  Requests one hour OR time.   INTRAUTERINE DEVICE INSERTION     X 2  Spontaneously extruded X 2.   2014/2015   TUBAL LIGATION  approximately 2000   as an interval procedure historically   Social History   Occupational History   Not on file  Tobacco Use   Smoking status: Former   Smokeless tobacco: Never  Vaping Use   Vaping status: Never Used  Substance and Sexual Activity   Alcohol use: Yes    Comment: occas    Drug use: No    Sexual activity: Yes    Partners: Male    Birth control/protection: Surgical    Comment: BTL

## 2023-02-05 NOTE — Procedures (Unsigned)
EMG & NCV Findings: Evaluation of the left median (across palm) sensory nerve showed no response (Palm).  All remaining nerves (as indicated in the following tables) were within normal limits.    All examined muscles (as indicated in the following table) showed no evidence of electrical instability.    Impression: Essentially NORMAL electrodiagnostic study of the left upper limb.  There is no significant electrodiagnostic evidence of nerve entrapment, brachial plexopathy or cervical radiculopathy.    As you know, purely sensory or demyelinating radiculopathies and chemical radiculitis may not be detected with this particular electrodiagnostic study. **This electrodiagnostic study cannot rule out small fiber polyneuropathy and dysesthesias from central pain syndromes such as stroke or central pain sensitization syndromes such as fibromyalgia.  Myotomal referral pain from trigger points is also not excluded.  Recommendations: 1.  Follow-up with referring physician. 2.  Continue current management of symptoms.  ___________________________ Naaman Plummer FAAPMR Board Certified, American Board of Physical Medicine and Rehabilitation    Nerve Conduction Studies Anti Sensory Summary Table   Stim Site NR Peak (ms) Norm Peak (ms) P-T Amp (V) Norm P-T Amp Site1 Site2 Delta-P (ms) Dist (cm) Vel (m/s) Norm Vel (m/s)  Left Median Acr Palm Anti Sensory (2nd Digit)  28.2C  Wrist    3.3 <3.6 35.5 >10 Wrist Palm  0.0    Palm *NR  <2.0          Left Radial Anti Sensory (Base 1st Digit)  29C  Wrist    2.0 <3.1 50.1  Wrist Base 1st Digit 2.0 0.0    Left Ulnar Anti Sensory (5th Digit)  28.9C  Wrist    3.5 <3.7 28.9 >15.0 Wrist 5th Digit 3.5 14.0 40 >38   Motor Summary Table   Stim Site NR Onset (ms) Norm Onset (ms) O-P Amp (mV) Norm O-P Amp Site1 Site2 Delta-0 (ms) Dist (cm) Vel (m/s) Norm Vel (m/s)  Left Median Motor (Abd Poll Brev)  29.1C  Wrist    3.3 <4.2 10.6 >5 Elbow Wrist 3.4 21.0 62 >50   Elbow    6.7  10.7         Left Ulnar Motor (Abd Dig Min)  29.3C  Wrist    3.1 <4.2 12.6 >3 B Elbow Wrist 2.8 19.5 70 >53  B Elbow    5.9  12.5  A Elbow B Elbow 1.4 11.0 79 >53  A Elbow    7.3  12.1          EMG   Side Muscle Nerve Root Ins Act Fibs Psw Amp Dur Poly Recrt Int Dennie Bible Comment  Left Abd Poll Brev Median C8-T1 Nml Nml Nml Nml Nml 0 Nml Nml   Left 1stDorInt Ulnar C8-T1 Nml Nml Nml Nml Nml 0 Nml Nml   Left PronatorTeres Median C6-7 Nml Nml Nml Nml Nml 0 Nml Nml   Left Biceps Musculocut C5-6 Nml Nml Nml Nml Nml 0 Nml Nml   Left Deltoid Axillary C5-6 Nml Nml Nml Nml Nml 0 Nml Nml     Nerve Conduction Studies Anti Sensory Left/Right Comparison   Stim Site L Lat (ms) R Lat (ms) L-R Lat (ms) L Amp (V) R Amp (V) L-R Amp (%) Site1 Site2 L Vel (m/s) R Vel (m/s) L-R Vel (m/s)  Median Acr Palm Anti Sensory (2nd Digit)  28.2C  Wrist 3.3   35.5   Wrist Palm     Palm             Radial Anti Sensory (  Base 1st Digit)  29C  Wrist 2.0   50.1   Wrist Base 1st Digit     Ulnar Anti Sensory (5th Digit)  28.9C  Wrist 3.5   28.9   Wrist 5th Digit 40     Motor Left/Right Comparison   Stim Site L Lat (ms) R Lat (ms) L-R Lat (ms) L Amp (mV) R Amp (mV) L-R Amp (%) Site1 Site2 L Vel (m/s) R Vel (m/s) L-R Vel (m/s)  Median Motor (Abd Poll Brev)  29.1C  Wrist 3.3   10.6   Elbow Wrist 62    Elbow 6.7   10.7         Ulnar Motor (Abd Dig Min)  29.3C  Wrist 3.1   12.6   B Elbow Wrist 70    B Elbow 5.9   12.5   A Elbow B Elbow 79    A Elbow 7.3   12.1            Waveforms:

## 2023-02-06 ENCOUNTER — Encounter (HOSPITAL_BASED_OUTPATIENT_CLINIC_OR_DEPARTMENT_OTHER): Payer: Self-pay

## 2023-02-06 NOTE — Telephone Encounter (Signed)
I placed a note in the chart. Can someone add this to the paperwork?

## 2023-02-06 NOTE — Telephone Encounter (Signed)
Relayed info to [employer].

## 2023-02-12 ENCOUNTER — Telehealth: Payer: Self-pay | Admitting: Family Medicine

## 2023-02-12 NOTE — Telephone Encounter (Signed)
Copied from CRM [id number]. Topic: General - Other >> Feb 12, 2023  1:44 PM Everette C wrote: Reason for CRM: The patient shares that they have called to ask for an update on the status/completion of their handicap placard application   The patient would also like to expressly request that their placard be specified as permanent rather than renewable every 6 months   Please contact the patient further if needed

## 2023-02-13 NOTE — Telephone Encounter (Signed)
Call place to patient and according to patient ortho doctor can only give place card every 6 month. Please advise

## 2023-02-15 ENCOUNTER — Ambulatory Visit (INDEPENDENT_AMBULATORY_CARE_PROVIDER_SITE_OTHER): Payer: BC Managed Care – PPO | Admitting: Orthopaedic Surgery

## 2023-02-15 ENCOUNTER — Encounter: Payer: Self-pay | Admitting: Orthopaedic Surgery

## 2023-02-15 DIAGNOSIS — G5602 Carpal tunnel syndrome, left upper limb: Secondary | ICD-10-CM | POA: Insufficient documentation

## 2023-02-15 NOTE — Telephone Encounter (Signed)
Form place in provider box for 5 year form fill out

## 2023-02-15 NOTE — Progress Notes (Unsigned)
Office Visit Note   Patient: Karen [name]           Date of Birth: Oct 08, 1970           MRN: [id number] Visit Date: 02/15/2023              Requested by: Georganna Skeans, MD [address] [place],  [place] [address] PCP: Georganna Skeans, MD   Assessment & Plan: Visit Diagnoses:  1. Left carpal tunnel syndrome     Plan: Patient is a 52 year old female with left carpal tunnel syndrome with negative electrodiagnostic studies.  Her symptoms are compatible with carpal tunnel syndrome.  She had carpal tunnel syndrome in the right hand with negative electrodiagnostic studies.  She had good relief from surgical treatment.  Based on her options she elects to proceed with a left carpal tunnel release in the near future.  Eunice Blase will call her to confirm surgery date.  She is no longer on Eliquis.  Follow-Up Instructions: No follow-ups on file.   Orders:  No orders of the defined types were placed in this encounter.  No orders of the defined types were placed in this encounter.     Procedures: No procedures performed   Clinical Data: No additional findings.   Subjective: Chief Complaint  Patient presents with   Left Arm - Follow-up    EMG review    HPI Patient is here to discuss recent EMGs.  She reports no change in her symptoms.  Her symptoms are reminiscent of her right hand which was diagnosed clinically as carpal tunnel syndrome.  She has had a carpal tunnel release on the right hand which shows resolved her symptoms.  Review of Systems  Constitutional: Negative.   HENT: Negative.    Eyes: Negative.   Respiratory: Negative.    Cardiovascular: Negative.   Endocrine: Negative.   Musculoskeletal: Negative.   Neurological: Negative.   Hematological: Negative.   Psychiatric/Behavioral: Negative.    All other systems reviewed and are negative.    Objective: Vital Signs: There were no vitals taken for this visit.  Physical Exam Vitals and nursing note  reviewed.  Constitutional:      Appearance: She is well-developed.  HENT:     Head: Normocephalic and atraumatic.  Pulmonary:     Effort: Pulmonary effort is normal.  Abdominal:     Palpations: Abdomen is soft.  Musculoskeletal:     Cervical back: Neck supple.  Skin:    General: Skin is warm.     Capillary Refill: Capillary refill takes less than 2 seconds.  Neurological:     Mental Status: She is alert and oriented to person, place, and time.  Psychiatric:        Behavior: Behavior normal.        Thought Content: Thought content normal.        Judgment: Judgment normal.     Ortho Exam Exam of the left hand is unchanged from the prior visit. Specialty Comments:  No specialty comments available.  Imaging: No results found.   PMFS History: Patient Active Problem List   Diagnosis Date Noted   Left carpal tunnel syndrome 02/15/2023   OSA (obstructive sleep apnea) 10/26/2019   Nocturnal hypoxemia due to obesity 10/26/2019   Depression 07/29/2019   Chronic anticoagulation 07/29/2019   Nocturnal dyspnea 07/29/2019   History of 2019 novel coronavirus disease (COVID-19) 07/29/2019   Right leg pain 07/29/2019   Brain fog 07/29/2019   Acute pulmonary embolism (HCC) 06/26/2019  Incidental lung nodule 06/26/2019   Steatosis of liver 06/25/2018   Carpal tunnel syndrome of right wrist 02/10/2018   Essential hypertension 10/26/2016   History of hypertension 10/26/2016   Menorrhagia 02/22/2015   Morbid obesity (HCC) 11/10/2012   Past Medical History:  Diagnosis Date   Acute pulmonary embolism (HCC) 06/26/2019   Arthritis    Left knee   Carpal tunnel syndrome of right wrist 02/10/2018   Essential hypertension 10/26/2016   Family history of early CAD    Gallstone 06/25/2018   Hypertension    Incidental lung nodule 06/26/2019   Menorrhagia 02/22/2015   Morbid obesity (HCC) 11/10/2012   BMI 63    Pain in right hand 01/27/2018   PONV (postoperative nausea and vomiting)     Right upper quadrant pain 06/25/2018   Steatosis of liver 06/25/2018    Family History  Problem Relation Age of Onset   Hyperlipidemia Father    Heart attack Father    Heart attack Other    Hypertension Other    Cancer Other    Stroke Other    Hypertension Mother    Parkinson's disease Mother     Past Surgical History:  Procedure Laterality Date   CARPAL TUNNEL RELEASE Right 04/04/2018   Procedure: CARPAL TUNNEL RELEASE;  Surgeon: Ranee Gosselin, MD;  Location: [facility];  Service: Orthopedics;  Laterality: Right;    CHOLECYSTECTOMY     gastic sleeve  2015   HYSTEROSCOPY WITH NOVASURE N/A 03/29/2015   Procedure: HYSTEROSCOPY WITH NOVASURE;  Surgeon: Dara Lords, MD;  Location: [facility];  Service: Gynecology;  Laterality: N/A;  to follow first case around 10:15am.  Requests one hour OR time.   INTRAUTERINE DEVICE INSERTION     X 2  Spontaneously extruded X 2.   2014/2015   TUBAL LIGATION  approximately 2000   as an interval procedure historically   Social History   Occupational History   Not on file  Tobacco Use   Smoking status: Former   Smokeless tobacco: Never  Vaping Use   Vaping status: Never Used  Substance and Sexual Activity   Alcohol use: Yes    Comment: occas    Drug use: No   Sexual activity: Yes    Partners: Male    Birth control/protection: Surgical    Comment: BTL

## 2023-02-22 ENCOUNTER — Ambulatory Visit: Payer: BC Managed Care – PPO

## 2023-02-22 ENCOUNTER — Ambulatory Visit
Admission: RE | Admit: 2023-02-22 | Discharge: 2023-02-22 | Disposition: A | Discharge status: Home / Self Care | Payer: BC Managed Care – PPO | Source: Ambulatory Visit | Attending: Family Medicine | Admitting: Family Medicine

## 2023-02-22 DIAGNOSIS — Z1231 Encounter for screening mammogram for malignant neoplasm of breast: Secondary | ICD-10-CM

## 2023-02-28 ENCOUNTER — Telehealth: Payer: Self-pay | Admitting: Orthopaedic Surgery

## 2023-02-28 NOTE — Telephone Encounter (Signed)
Called patient to offer surgery dates for left carpal tunnel release with Dr. Roda Shutters.  Provided my name and direct number to call when ready to schedule.

## 2023-03-05 DIAGNOSIS — F600 Paranoid personality disorder: Secondary | ICD-10-CM | POA: Diagnosis not present

## 2023-03-12 ENCOUNTER — Other Ambulatory Visit: Payer: Self-pay | Admitting: Physician Assistant

## 2023-03-12 DIAGNOSIS — F600 Paranoid personality disorder: Secondary | ICD-10-CM | POA: Diagnosis not present

## 2023-03-12 MED ORDER — HYDROCODONE-ACETAMINOPHEN 5-325 MG PO TABS: 1.0000 | ORAL_TABLET | Freq: Three times a day (TID) | ORAL | 0 refills | Status: DC | PRN

## 2023-03-12 MED ORDER — ONDANSETRON HCL 4 MG PO TABS: 4.0000 mg | ORAL_TABLET | Freq: Three times a day (TID) | ORAL | 0 refills | Status: DC | PRN

## 2023-03-14 DIAGNOSIS — G5602 Carpal tunnel syndrome, left upper limb: Secondary | ICD-10-CM | POA: Diagnosis not present

## 2023-03-19 DIAGNOSIS — F600 Paranoid personality disorder: Secondary | ICD-10-CM | POA: Diagnosis not present

## 2023-03-22 ENCOUNTER — Ambulatory Visit (INDEPENDENT_AMBULATORY_CARE_PROVIDER_SITE_OTHER): Payer: BC Managed Care – PPO | Admitting: Physician Assistant

## 2023-03-22 ENCOUNTER — Encounter: Payer: Self-pay | Admitting: Orthopaedic Surgery

## 2023-03-22 DIAGNOSIS — G5602 Carpal tunnel syndrome, left upper limb: Secondary | ICD-10-CM

## 2023-03-22 MED ORDER — ONDANSETRON HCL 4 MG PO TABS: 4.0000 mg | ORAL_TABLET | Freq: Three times a day (TID) | ORAL | 0 refills | Status: DC | PRN

## 2023-03-22 MED ORDER — HYDROCODONE-ACETAMINOPHEN 5-325 MG PO TABS: 1.0000 | ORAL_TABLET | Freq: Two times a day (BID) | ORAL | 0 refills | Status: DC | PRN

## 2023-03-22 NOTE — Progress Notes (Signed)
Post-Op Visit Note   Patient: Karen [name]           Date of Birth: 09/02/1970           MRN: [id number] Visit Date: 03/22/2023 PCP: Georganna Skeans, MD   Assessment & Plan:  Chief Complaint:  Chief Complaint  Patient presents with   Left Wrist - Follow-up    Left carpal tunnel release 03/14/2023   Visit Diagnoses:  1. Left carpal tunnel syndrome     Plan: Patient is a pleasant 52 year old female who comes in today 1 week status post left carpal tunnel release 03/14/2023.  She has been in pain and has had associated swelling and paresthesias.  She is taking Norco for this.  Examination of the left hand reveals a well-healing surgical incision with nylon sutures in place.  No evidence of infection or cellulitis.  Fingers are warm and well-perfused.  She does have a fair amount of swelling to the left hand.  She does have decreased sensation to the index, long and ring fingers.  Motor function intact.  Today, her wound was cleaned and recovered.  Velcro splint applied.  No lifting or submerging her hand in water for another 3 weeks.  Follow-up next week for suture removal.  Call with concerns or questions.  Follow-Up Instructions: Return in about 1 week (around 03/29/2023).   Orders:  No orders of the defined types were placed in this encounter.  Meds ordered this encounter  Medications   HYDROcodone-acetaminophen (NORCO) 5-325 MG tablet    Sig: Take 1 tablet by mouth 2 (two) times daily as needed. To be taken after surgery    Dispense:  14 tablet    Refill:  0   ondansetron (ZOFRAN) 4 MG tablet    Sig: Take 1 tablet (4 mg total) by mouth every 8 (eight) hours as needed for nausea or vomiting.    Dispense:  40 tablet    Refill:  0    Imaging: No new imaging  PMFS History: Patient Active Problem List   Diagnosis Date Noted   Left carpal tunnel syndrome 02/15/2023   OSA (obstructive sleep apnea) 10/26/2019   Nocturnal hypoxemia due to obesity 10/26/2019   Depression  07/29/2019   Chronic anticoagulation 07/29/2019   Nocturnal dyspnea 07/29/2019   History of 2019 novel coronavirus disease (COVID-19) 07/29/2019   Right leg pain 07/29/2019   Brain fog 07/29/2019   Acute pulmonary embolism (HCC) 06/26/2019   Incidental lung nodule 06/26/2019   Steatosis of liver 06/25/2018   Carpal tunnel syndrome of right wrist 02/10/2018   Essential hypertension 10/26/2016   History of hypertension 10/26/2016   Menorrhagia 02/22/2015   Morbid obesity (HCC) 11/10/2012   Past Medical History:  Diagnosis Date   Acute pulmonary embolism (HCC) 06/26/2019   Arthritis    Left knee   Carpal tunnel syndrome of right wrist 02/10/2018   Essential hypertension 10/26/2016   Family history of early CAD    Gallstone 06/25/2018   Hypertension    Incidental lung nodule 06/26/2019   Menorrhagia 02/22/2015   Morbid obesity (HCC) 11/10/2012   BMI 63    Pain in right hand 01/27/2018   PONV (postoperative nausea and vomiting)    Right upper quadrant pain 06/25/2018   Steatosis of liver 06/25/2018    Family History  Problem Relation Age of Onset   Hyperlipidemia Father    Heart attack Father    Heart attack Other    Hypertension Other  Cancer Other    Stroke Other    Hypertension Mother    Parkinson's disease Mother     Past Surgical History:  Procedure Laterality Date   CARPAL TUNNEL RELEASE Right 04/04/2018   Procedure: CARPAL TUNNEL RELEASE;  Surgeon: Ranee Gosselin, MD;  Location: [facility];  Service: Orthopedics;  Laterality: Right;    CHOLECYSTECTOMY     gastic sleeve  2015   HYSTEROSCOPY WITH NOVASURE N/A 03/29/2015   Procedure: HYSTEROSCOPY WITH NOVASURE;  Surgeon: Dara Lords, MD;  Location: [facility];  Service: Gynecology;  Laterality: N/A;  to follow first case around 10:15am.  Requests one hour OR time.   INTRAUTERINE DEVICE INSERTION     X 2  Spontaneously extruded X 2.   2014/2015   TUBAL LIGATION  approximately 2000   as an interval procedure  historically   Social History   Occupational History   Not on file  Tobacco Use   Smoking status: Former   Smokeless tobacco: Never  Vaping Use   Vaping status: Never Used  Substance and Sexual Activity   Alcohol use: Yes    Comment: occas    Drug use: No   Sexual activity: Yes    Partners: Male    Birth control/protection: Surgical    Comment: BTL

## 2023-03-25 ENCOUNTER — Other Ambulatory Visit: Payer: Self-pay | Admitting: Family Medicine

## 2023-03-26 DIAGNOSIS — F4323 Adjustment disorder with mixed anxiety and depressed mood: Secondary | ICD-10-CM | POA: Diagnosis not present

## 2023-03-26 DIAGNOSIS — F600 Paranoid personality disorder: Secondary | ICD-10-CM | POA: Diagnosis not present

## 2023-04-03 NOTE — Progress Notes (Unsigned)
   Post-Op Visit Note   Patient: Karen [name]           Date of Birth: [birth year]           MRN: [id number] Visit Date: 04/04/2023 PCP: Georganna Skeans, MD   Assessment & Plan:  Chief Complaint: No chief complaint on file.  Visit Diagnoses:  1. Carpal tunnel syndrome of right wrist     Plan: ***  Follow-Up Instructions: No follow-ups on file.   Orders:  No orders of the defined types were placed in this encounter.  No orders of the defined types were placed in this encounter.   Imaging: No results found.  PMFS History: Patient Active Problem List   Diagnosis Date Noted   Left carpal tunnel syndrome 02/15/2023   OSA (obstructive sleep apnea) 10/26/2019   Nocturnal hypoxemia due to obesity 10/26/2019   Depression 07/29/2019   Chronic anticoagulation 07/29/2019   Nocturnal dyspnea 07/29/2019   History of 2019 novel coronavirus disease (COVID-19) 07/29/2019   Right leg pain 07/29/2019   Brain fog 07/29/2019   Acute pulmonary embolism (HCC) 06/26/2019   Incidental lung nodule 06/26/2019   Steatosis of liver 06/25/2018   Carpal tunnel syndrome of right wrist 02/10/2018   Essential hypertension 10/26/2016   History of hypertension 10/26/2016   Menorrhagia 02/22/2015   Morbid obesity (HCC) 11/10/2012   Past Medical History:  Diagnosis Date   Acute pulmonary embolism (HCC) 06/26/2019   Arthritis    Left knee   Carpal tunnel syndrome of right wrist 02/10/2018   Essential hypertension 10/26/2016   Family history of early CAD    Gallstone 06/25/2018   Hypertension    Incidental lung nodule 06/26/2019   Menorrhagia 02/22/2015   Morbid obesity (HCC) 11/10/2012   BMI 63    Pain in right hand 01/27/2018   PONV (postoperative nausea and vomiting)    Right upper quadrant pain 06/25/2018   Steatosis of liver 06/25/2018    Family History  Problem Relation Age of Onset   Hyperlipidemia Father    Heart attack Father    Heart attack Other    Hypertension Other    Cancer  Other    Stroke Other    Hypertension Mother    Parkinson's disease Mother     Past Surgical History:  Procedure Laterality Date   CARPAL TUNNEL RELEASE Right 04/04/2018   Procedure: CARPAL TUNNEL RELEASE;  Surgeon: Ranee Gosselin, MD;  Location: [facility];  Service: Orthopedics;  Laterality: Right;    CHOLECYSTECTOMY     gastic sleeve  2015   HYSTEROSCOPY WITH NOVASURE N/A 03/29/2015   Procedure: HYSTEROSCOPY WITH NOVASURE;  Surgeon: Dara Lords, MD;  Location: [facility];  Service: Gynecology;  Laterality: N/A;  to follow first case around 10:15am.  Requests one hour OR time.   INTRAUTERINE DEVICE INSERTION     X 2  Spontaneously extruded X 2.   2014/2015   TUBAL LIGATION  approximately 2000   as an interval procedure historically   Social History   Occupational History   Not on file  Tobacco Use   Smoking status: Former   Smokeless tobacco: Never  Vaping Use   Vaping status: Never Used  Substance and Sexual Activity   Alcohol use: Yes    Comment: occas    Drug use: No   Sexual activity: Yes    Partners: Male    Birth control/protection: Surgical    Comment: BTL

## 2023-04-04 ENCOUNTER — Ambulatory Visit: Payer: BC Managed Care – PPO | Admitting: Orthopaedic Surgery

## 2023-04-04 ENCOUNTER — Encounter: Payer: Self-pay | Admitting: Orthopaedic Surgery

## 2023-04-04 DIAGNOSIS — G5601 Carpal tunnel syndrome, right upper limb: Secondary | ICD-10-CM

## 2023-04-04 MED ORDER — HYDROCODONE-ACETAMINOPHEN 5-325 MG PO TABS: 1.0000 | ORAL_TABLET | Freq: Every day | ORAL | 0 refills | Status: DC | PRN

## 2023-04-16 DIAGNOSIS — F600 Paranoid personality disorder: Secondary | ICD-10-CM | POA: Diagnosis not present

## 2023-04-19 ENCOUNTER — Telehealth: Payer: Self-pay | Admitting: Family Medicine

## 2023-04-19 NOTE — Telephone Encounter (Signed)
 I have attempted without success to contact this patient by phone to return their call and I left a message on answering machine.

## 2023-04-19 NOTE — Telephone Encounter (Signed)
 Copied from CRM [id number]. Topic: General - Inquiry >> Apr 19, 2023  9:55 AM Haroldine Laws wrote: Reason for CRM: pt called asking why the Zepbound was denied.  That's what the pharmacy told her.  Please advise.[phone]

## 2023-04-24 ENCOUNTER — Encounter: Payer: Self-pay | Admitting: Neurology

## 2023-04-24 ENCOUNTER — Ambulatory Visit: Payer: BC Managed Care – PPO | Admitting: Neurology

## 2023-04-24 VITALS — BP 127/84 | HR 97 | Ht 62.0 in | Wt 251.0 lb

## 2023-04-24 DIAGNOSIS — G3184 Mild cognitive impairment, so stated: Secondary | ICD-10-CM | POA: Diagnosis not present

## 2023-04-24 NOTE — Patient Instructions (Addendum)
 Dementia labs including B12, TSH, ATN profile  MRI Brain  Referral to neuropsychiatry  Discussed ways of reducing the risk of developing dementia including keeping a good health, good sleep and good diet.   There are well-accepted and sensible ways to reduce risk for Alzheimers disease and other degenerative brain disorders .  Exercise Daily Walk A daily 20 minute walk should be part of your routine. Disease related apathy can be a significant roadblock to exercise and the only way to overcome this is to make it a daily routine and perhaps have a reward at the end (something your loved one loves to eat or drink perhaps) or a personal trainer coming to the home can also be very useful. Most importantly, the patient is much more likely to exercise if the caregiver / spouse does it with him/her. In general a structured, repetitive schedule is best.  General Health: Any diseases which effect your body will effect your brain such as a pneumonia, urinary infection, blood clot, heart attack or stroke. Keep contact with your primary care doctor for regular follow ups.  Sleep. A good nights sleep is healthy for the brain. Seven hours is recommended. If you have insomnia or poor sleep habits we can give you some instructions. If you have sleep apnea wear your mask.  Diet: Eating a heart healthy diet is also a good idea; fish and poultry instead of red meat, nuts (mostly non-peanuts), vegetables, fruits, olive oil or canola oil (instead of butter), minimal salt (use other spices to flavor foods), whole grain rice, bread, cereal and pasta and wine in moderation.Research is now showing that the MIND diet, which is a combination of The Mediterranean diet and the DASH diet, is beneficial for cognitive processing and longevity. Information about this diet can be found in The MIND Diet, a book by Andria Keeler, MS, RDN, and online at [employer]WildScience.es  Finances, Power of [address] and Advance  Directives: You should consider putting legal safeguards in place with regard to financial and medical decision making. While the spouse always has power of attorney for medical and financial issues in the absence of any form, you should consider what you want in case the spouse / caregiver is no longer around or capable of making decisions.

## 2023-04-24 NOTE — Progress Notes (Signed)
 [facility]  PATIENT: Karen [name] DOB: 1970-09-20  REQUESTING CLINICIAN: Abraham Abo, MD HISTORY FROM: Patient  REASON FOR VISIT: Memory loss/Brain fog    HISTORICAL  CHIEF COMPLAINT:  Chief Complaint  Patient presents with   New Patient (Initial Visit)    Rm12, alone,  Internal referral for covid induced memory concerns: moca score    HISTORY OF PRESENT ILLNESS:  This is a 53 year old woman past medical history of hypertension, obesity who is presenting with memory complaint for the past 3 years.  Patient reports in 2022 she was diagnosed with COVID, at that time she was hospitalized, found to have blood clot and put on anticoagulant. This is where her memory problems started.  Since then she has contracted COVID four more time the last 1 being in early 2024.  She has seen a neurologist previously and was diagnosed with COVID long-haul but was not offered any treatment.  She describes her memory problem as taking longer to form sentence, she has brain fog, word finding difficulty and she is more forgetful, she tells me that she gets more confused easily.  She has issues completing her job and has been written up.  She denies any family history of dementia, denies any dementia risk factors.  Reports that family members have been complaining about her memory.    TBI:   No past history of TBI Stroke:   no past history of stroke Seizures:   no past history of seizures Sleep:   no history of sleep apnea.   Mood:  patient with anxiety and depression, seeing a therapist  Family history of Dementia: Denies  Functional status: independent in all ADLs and IADLs Patient lives with Husband . Cooking: yes, can forget something to add Cleaning: yes Shopping: list Bathing: no issues Toileting: no issues Driving: Yes Bills: Have been late on bill Medications: no issues  Ever left the stove on by accident?: Denies  Forget how to use items around the house?:  Denies  Getting lost going to familiar places?: Denies  Forgetting loved ones names?: Denies  Word finding difficulty? Yes Sleep: Mirtazapine     OTHER MEDICAL CONDITIONS: Hypertension, Obesity   REVIEW OF SYSTEMS: Full 14 system review of systems performed and negative with exception of: As noted in the HPI  ALLERGIES: Allergies  Allergen Reactions   Nsaids     History of gastric sleeve   Other Other (See Comments)    Patient stated that she can only take chewable and liquid medication Patient says she can now swallow solid pills/capsules/caplets 06/25/2018    HOME MEDICATIONS: Outpatient Medications Prior to Visit  Medication Sig Dispense Refill   albuterol  (VENTOLIN  HFA) 108 (90 Base) MCG/ACT inhaler TAKE 2 PUFFS BY MOUTH EVERY 6 HOURS AS NEEDED FOR WHEEZE OR SHORTNESS OF BREATH 8.5 each 0   amLODipine  (NORVASC ) 10 MG tablet TAKE 1 TABLET BY MOUTH EVERY DAY 90 tablet 1   butalbital -acetaminophen -caffeine  (FIORICET) 50-325-40 MG tablet Take 50-235 tablets by mouth as needed.     Calcium  Carbonate-Vitamin D  (CALCIUM -VITAMIN D ) 600-125 MG-UNIT TABS Take 1 tablet by mouth daily.     CREON 36000-114000 units CPEP capsule Take 1 capsule by mouth 3 (three) times daily with meals.     furosemide  (LASIX ) 40 MG tablet Take 1 tablet (40 mg total) by mouth once a week. Pt. Need to schedule an appt in order to receive future refills. Thank you. 2nd attempt. 2 tablet 0   gabapentin  (NEURONTIN ) 300 MG capsule  START WITH 1 CAPSULE AT BEDTIME X 3 DAYS THEN 2 CAPS AT BEDTIME EVERY DAY     meclizine  (ANTIVERT ) 12.5 MG tablet Take 1 tablet (12.5 mg total) by mouth 3 (three) times daily as needed for dizziness. 30 tablet 0   meloxicam  (MOBIC ) 7.5 MG tablet Take 1 tablet (7.5 mg total) by mouth daily. 30 tablet 0   mirtazapine  (REMERON ) 15 MG tablet TAKE 1 TABLET BY MOUTH EVERYDAY AT BEDTIME 90 tablet 1   ondansetron  (ZOFRAN ) 4 MG tablet Take 1 tablet (4 mg total) by mouth every 8 (eight) hours as  needed for nausea or vomiting. 30 tablet 0   ondansetron  (ZOFRAN ) 4 MG tablet Take 1 tablet (4 mg total) by mouth every 8 (eight) hours as needed for nausea or vomiting. 40 tablet 0   potassium chloride  (KLOR-CON ) 10 MEQ tablet TAKE 1 TABLET EVERY MONDAY 2 tablet 0   scopolamine  (TRANSDERM-SCOP) 1 MG/3DAYS Place 1 patch (1.5 mg total) onto the skin every 3 (three) days. 4 patch 0   sucralfate  (CARAFATE ) 1 g tablet Take 1 tablet (1 g total) by mouth 4 (four) times daily -  with meals and at bedtime. 90 tablet 0   tirzepatide  (ZEPBOUND ) 2.5 MG/0.5ML Pen Inject 2.5 mg into the skin once a week. 2 mL 0   traMADol  (ULTRAM ) 50 MG tablet TAKE 1 TABLET (50 MG TOTAL) BY MOUTH EVERY 6 (SIX) HOURS AS NEEDED FOR UP TO 5 DAYS.     triamcinolone  cream (KENALOG ) 0.1 % APPLY 1 APPLICATION TOPICALLY TWICE A DAY AS NEEDED 454 g 0   Vitamin D , Ergocalciferol , (DRISDOL ) 1.25 MG (50000 UNIT) CAPS capsule Take 1 capsule (50,000 Units total) by mouth every 7 (seven) days. 12 capsule 0   apixaban  (ELIQUIS ) 5 MG TABS tablet Take 1 tablet (5 mg total) by mouth 2 (two) times daily. 180 tablet 0   HYDROcodone -acetaminophen  (NORCO) 5-325 MG tablet Take 1 tablet by mouth daily as needed. To be taken after surgery 7 tablet 0   megestrol  (MEGACE ) 40 MG tablet TAKE 1 TABLET (40 MG TOTAL) BY MOUTH 2 (TWO) TIMES DAILY. UNTIL BLEEDING STOPS     Naltrexone-buPROPion HCl ER (CONTRAVE) 8-90 MG TB12 1TAB IN AM X1WK, 1 TAB TWICE DAILY X1WK, 2TABS IN AM 1 IN PM X1WK, 2 TABS TWICE DAILY     pantoprazole  (PROTONIX ) 40 MG tablet TAKE 1 TABLET BY MOUTH EVERY DAY 90 tablet 2   rosuvastatin  (CRESTOR ) 10 MG tablet TAKE 1 TABLET BY MOUTH EVERY DAY 90 tablet 0   No facility-administered medications prior to visit.    PAST MEDICAL HISTORY: Past Medical History:  Diagnosis Date   Acute pulmonary embolism (HCC) 06/26/2019   Arthritis    Left knee   Carpal tunnel syndrome of right wrist 02/10/2018   Essential hypertension 10/26/2016   Family  history of early CAD    Gallstone 06/25/2018   Hypertension    Incidental lung nodule 06/26/2019   Menorrhagia 02/22/2015   Morbid obesity (HCC) 11/10/2012   BMI 63    Pain in right hand 01/27/2018   PONV (postoperative nausea and vomiting)    Right upper quadrant pain 06/25/2018   Steatosis of liver 06/25/2018    PAST SURGICAL HISTORY: Past Surgical History:  Procedure Laterality Date   CARPAL TUNNEL RELEASE Right 04/04/2018   Procedure: CARPAL TUNNEL RELEASE;  Surgeon: Hazle Lites, MD;  Location: [facility];  Service: Orthopedics;  Laterality: Right;    CHOLECYSTECTOMY     gastic sleeve  2015   HYSTEROSCOPY WITH NOVASURE N/A 03/29/2015   Procedure: HYSTEROSCOPY WITH NOVASURE;  Surgeon: Lacretia Piccolo, MD;  Location: [facility];  Service: Gynecology;  Laterality: N/A;  to follow first case around 10:15am.  Requests one hour OR time.   INTRAUTERINE DEVICE INSERTION     X 2  Spontaneously extruded X 2.   2014/2015   TUBAL LIGATION  approximately 2000   as an interval procedure historically    FAMILY HISTORY: Family History  Problem Relation Age of Onset   Hyperlipidemia Father    Heart attack Father    Heart attack Other    Hypertension Other    Cancer Other    Stroke Other    Hypertension Mother    Parkinson's disease Mother     SOCIAL HISTORY: Social History   Socioeconomic History   Marital status: Married    Spouse name: Not on file   Number of children: Not on file   Years of education: Not on file   Highest education level: Not on file  Occupational History   Not on file  Tobacco Use   Smoking status: Former   Smokeless tobacco: Never  Vaping Use   Vaping status: Never Used  Substance and Sexual Activity   Alcohol use: Yes    Comment: occas    Drug use: No   Sexual activity: Yes    Partners: Male    Birth control/protection: Surgical    Comment: BTL  Other Topics Concern   Not on file  Social History Narrative   Right handed   Lives with  friend, two story home   Social Drivers of Health   Financial Resource Strain: Low Risk  (12/18/2022)   Overall Financial Resource Strain (CARDIA)    Difficulty of Paying Living Expenses: Not hard at all  Food Insecurity: No Food Insecurity (12/18/2022)   Hunger Vital Sign    Worried About Running Out of Food in the Last Year: Never true    Ran Out of Food in the Last Year: Never true  Transportation Needs: No Transportation Needs (12/18/2022)   PRAPARE - Administrator, Civil Service (Medical): No    Lack of Transportation (Non-Medical): No  Physical Activity: Inactive (12/18/2022)   Exercise Vital Sign    Days of Exercise per Week: 0 days    Minutes of Exercise per Session: 0 min  Stress: Stress Concern Present (12/18/2022)   [employer] of Occupational Health - Occupational Stress Questionnaire    Feeling of Stress : To some extent  Social Connections: Moderately Integrated (12/18/2022)   Social Connection and Isolation Panel [NHANES]    Frequency of Communication with Friends and Family: More than three times a week    Frequency of Social Gatherings with Friends and Family: More than three times a week    Attends Religious Services: More than 4 times per year    Active Member of [employer] or Organizations: No    Attends Banker Meetings: Never    Marital Status: Married  Catering manager Violence: Not At Risk (12/18/2022)   Humiliation, Afraid, Rape, and Kick questionnaire    Fear of Current or Ex-Partner: No    Emotionally Abused: No    Physically Abused: No    Sexually Abused: No    PHYSICAL EXAM   GENERAL EXAM/CONSTITUTIONAL: Vitals:  Vitals:   04/24/23 1308 04/24/23 1318  BP: 139/85 127/84  Pulse: 97   Weight: 251 lb (113.9 kg)   Height: 5'  2" (1.575 m)    Body mass index is 45.91 kg/m. Wt Readings from Last 3 Encounters:  04/24/23 251 lb (113.9 kg)  01/22/23 246 lb 9.6 oz (111.9 kg)  12/18/22 243 lb 12.8 oz (110.6 kg)   Patient is  in no distress; well developed, nourished and groomed; neck is supple  MUSCULOSKELETAL: Gait, strength, tone, movements noted in Neurologic exam below  NEUROLOGIC: MENTAL STATUS:      No data to display            04/24/2023    1:11 PM  Montreal Cognitive Assessment   Visuospatial/ Executive (0/5) 3  Naming (0/3) 3  Attention: Read list of digits (0/2) 1  Attention: Read list of letters (0/1) 1  Attention: Serial 7 subtraction starting at 100 (0/3) 2  Language: Repeat phrase (0/2) 1  Language : Fluency (0/1) 0  Abstraction (0/2) 2  Delayed Recall (0/5) 2  Orientation (0/6) 6  Total 21    CRANIAL NERVE:  2nd, 3rd, 4th, 6th- visual fields full to confrontation, extraocular muscles intact, no nystagmus 5th - facial sensation symmetric 7th - facial strength symmetric 8th - hearing intact 9th - palate elevates symmetrically, uvula midline 11th - shoulder shrug symmetric 12th - tongue protrusion midline  MOTOR:  normal bulk and tone, full strength in the BUE, BLE  SENSORY:  normal and symmetric to light touch  COORDINATION:  finger-nose-finger, fine finger movements normal  GAIT/STATION:  normal   DIAGNOSTIC DATA (LABS, IMAGING, TESTING) - I reviewed patient records, labs, notes, testing and imaging myself where available.  Lab Results  Component Value Date   WBC 7.3 07/12/2022   HGB 11.5 07/12/2022   HCT 36.7 07/12/2022   MCV 88 07/12/2022   PLT 272 07/12/2022      Component Value Date/Time   NA 144 07/12/2022 1147   NA 137 05/29/2013 0420   K 4.8 07/12/2022 1147   K 3.8 05/29/2013 0420   CL 105 07/12/2022 1147   CL 106 05/29/2013 0420   CO2 25 07/12/2022 1147   CO2 25 05/29/2013 0420   GLUCOSE 83 07/12/2022 1147   GLUCOSE 93 06/26/2019 0421   GLUCOSE 95 05/29/2013 0420   BUN 8 07/12/2022 1147   BUN 5 (L) 05/29/2013 0420   CREATININE 0.58 07/12/2022 1147   CREATININE 0.81 05/29/2013 0420   CALCIUM  8.9 07/12/2022 1147   CALCIUM  8.5 05/29/2013  0420   PROT 6.5 07/12/2022 1147   PROT 7.9 04/13/2013 1041   ALBUMIN 3.9 07/12/2022 1147   ALBUMIN 3.3 (L) 05/29/2013 0420   AST 21 07/12/2022 1147   AST 47 (H) 04/13/2013 1041   ALT 17 07/12/2022 1147   ALT 54 04/13/2013 1041   ALKPHOS 119 07/12/2022 1147   ALKPHOS 100 04/13/2013 1041   BILITOT 0.3 07/12/2022 1147   BILITOT 0.4 04/13/2013 1041   GFRNONAA 102 07/29/2019 1111   GFRNONAA >60 05/29/2013 0420   GFRAA 117 07/29/2019 1111   GFRAA >60 05/29/2013 0420   Lab Results  Component Value Date   CHOL 143 07/12/2022   HDL 40 07/12/2022   LDLCALC 88 07/12/2022   TRIG 79 07/12/2022   CHOLHDL 3.6 07/12/2022   Lab Results  Component Value Date   HGBA1C 4.8 07/12/2022   No results found for: "VITAMINB12" Lab Results  Component Value Date   TSH 2.000 07/12/2022     ASSESSMENT AND PLAN  53 y.o. year old female with history of hypertension, obesity who is presenting with complaint of  memory loss for the past 3 years after her COVID infection.  Memory loss described as being forgetful, easy confusion, word finding difficulty and brain fog.  On exam he scored 21 out of 30 on MoCA indicative of impairment.  Plan will be to obtain dementia lab, MRI brain and to obtain a formal neuropsychological testing.  I will contact the patient to go over the results and to discuss next steps.  Informed her that her neuropsychological testing might be scheduled a year out from today.  Advised her to continue following up with her PCP and return as needed.   1. Mild cognitive impairment      Patient Instructions  Dementia labs including B12, TSH, ATN profile  MRI Brain  Referral to neuropsychiatry  Discussed ways of reducing the risk of developing dementia including keeping a good health, good sleep and good diet.   There are well-accepted and sensible ways to reduce risk for Alzheimers disease and other degenerative brain disorders .  Exercise Daily Walk A daily 20 minute walk should be  part of your routine. Disease related apathy can be a significant roadblock to exercise and the only way to overcome this is to make it a daily routine and perhaps have a reward at the end (something your loved one loves to eat or drink perhaps) or a personal trainer coming to the home can also be very useful. Most importantly, the patient is much more likely to exercise if the caregiver / spouse does it with him/her. In general a structured, repetitive schedule is best.  General Health: Any diseases which effect your body will effect your brain such as a pneumonia, urinary infection, blood clot, heart attack or stroke. Keep contact with your primary care doctor for regular follow ups.  Sleep. A good nights sleep is healthy for the brain. Seven hours is recommended. If you have insomnia or poor sleep habits we can give you some instructions. If you have sleep apnea wear your mask.  Diet: Eating a heart healthy diet is also a good idea; fish and poultry instead of red meat, nuts (mostly non-peanuts), vegetables, fruits, olive oil or canola oil (instead of butter), minimal salt (use other spices to flavor foods), whole grain rice, bread, cereal and pasta and wine in moderation.Research is now showing that the MIND diet, which is a combination of The Mediterranean diet and the DASH diet, is beneficial for cognitive processing and longevity. Information about this diet can be found in The MIND Diet, a book by Andria Keeler, MS, RDN, and online at [employer].es  Finances, Power of [address] and Advance Directives: You should consider putting legal safeguards in place with regard to financial and medical decision making. While the spouse always has power of attorney for medical and financial issues in the absence of any form, you should consider what you want in case the spouse / caregiver is no longer around or capable of making decisions.   Orders Placed This Encounter   Procedures   MR BRAIN WO CONTRAST   TSH   Vitamin B12   ATN PROFILE   Ambulatory referral to Neuropsychology    No orders of the defined types were placed in this encounter.   Return if symptoms worsen or fail to improve.  I have spent a total of 60 minutes dedicated to this patient today, preparing to see patient, performing a medically appropriate examination and evaluation, ordering tests and/or medications and procedures, and counseling and educating the patient/family/caregiver; independently interpreting result  and communicating results to the family/patient/caregiver; and documenting clinical information in the electronic medical record.   Cassandra Cleveland, MD 04/24/2023, 5:10 PM  [facility] [address] [place], [place] [address] [phone]

## 2023-04-25 ENCOUNTER — Telehealth: Payer: Self-pay | Admitting: Neurology

## 2023-04-25 NOTE — Telephone Encounter (Signed)
Referral for neuropsychology fax to [facility]. Phone: [phone], Fax:  [phone]

## 2023-04-25 NOTE — Telephone Encounter (Signed)
[employer] Berkley Harvey: [phone] exp. 04/24/23-05/23/23 sent to [facility] for an open MRI. [phone]

## 2023-04-27 LAB — ATN PROFILE
A -- Beta-amyloid 42/40 Ratio: 0.138 (ref >0.102)
Beta-amyloid 40: 163.02 pg/mL
Beta-amyloid 42: 22.51 pg/mL
N -- NfL, Plasma: 1.1 pg/mL (ref 0.00–3.78)
T -- p-tau181: 0.34 pg/mL (ref 0.00–0.95)

## 2023-04-27 LAB — TSH: TSH: 1.54 u[IU]/mL (ref 0.450–4.500)

## 2023-04-27 LAB — VITAMIN B12: Vitamin B-12: 1799 pg/mL — ABNORMAL HIGH (ref 232–1245)

## 2023-04-28 ENCOUNTER — Encounter: Payer: Self-pay | Admitting: Neurology

## 2023-04-30 DIAGNOSIS — F600 Paranoid personality disorder: Secondary | ICD-10-CM | POA: Diagnosis not present

## 2023-05-03 ENCOUNTER — Ambulatory Visit (INDEPENDENT_AMBULATORY_CARE_PROVIDER_SITE_OTHER): Payer: BC Managed Care – PPO | Admitting: Orthopaedic Surgery

## 2023-05-03 ENCOUNTER — Encounter: Payer: Self-pay | Admitting: Orthopaedic Surgery

## 2023-05-03 DIAGNOSIS — G5602 Carpal tunnel syndrome, left upper limb: Secondary | ICD-10-CM

## 2023-05-03 NOTE — Progress Notes (Signed)
Post-Op Visit Note   Patient: Karen [name]           Date of Birth: 1970-06-04           MRN: [id number] Visit Date: 05/03/2023 PCP: Georganna Skeans, MD   Assessment & Plan:  Chief Complaint:  Chief Complaint  Patient presents with   Left Hand - Follow-up    Left carpal tunnel release 03/14/2023   Visit Diagnoses:  1. Left carpal tunnel syndrome     Plan: Patient is 6 weeks status post left carpal tunnel release.  She feels some throbbing around the incision and into the forearms pressure when she extends her fingers.  Denies any constitutional symptoms.  Has weakness in grip and holding things.  Examination of the left hand shows a fully healed surgical scar.  She has full arc of motion of all 5 digits.  She has some slight tenderness along the surgical scar.  There is no evidence of infection.  Patient is experiencing postsurgical symptoms that are to be expected.  She would like to go to hand therapy to work on scar desensitization and hand rehab in general.  Follow-up as needed.  Follow-Up Instructions: No follow-ups on file.   Orders:  Orders Placed This Encounter  Procedures   Ambulatory referral to Occupational Therapy   No orders of the defined types were placed in this encounter.   Imaging: No results found.  PMFS History: Patient Active Problem List   Diagnosis Date Noted   Left carpal tunnel syndrome 02/15/2023   OSA (obstructive sleep apnea) 10/26/2019   Nocturnal hypoxemia due to obesity 10/26/2019   Depression 07/29/2019   Chronic anticoagulation 07/29/2019   Nocturnal dyspnea 07/29/2019   History of 2019 novel coronavirus disease (COVID-19) 07/29/2019   Right leg pain 07/29/2019   Brain fog 07/29/2019   Acute pulmonary embolism (HCC) 06/26/2019   Incidental lung nodule 06/26/2019   Steatosis of liver 06/25/2018   Carpal tunnel syndrome of right wrist 02/10/2018   Essential hypertension 10/26/2016   History of hypertension 10/26/2016    Menorrhagia 02/22/2015   Morbid obesity (HCC) 11/10/2012   Past Medical History:  Diagnosis Date   Acute pulmonary embolism (HCC) 06/26/2019   Arthritis    Left knee   Carpal tunnel syndrome of right wrist 02/10/2018   Essential hypertension 10/26/2016   Family history of early CAD    Gallstone 06/25/2018   Hypertension    Incidental lung nodule 06/26/2019   Menorrhagia 02/22/2015   Morbid obesity (HCC) 11/10/2012   BMI 63    Pain in right hand 01/27/2018   PONV (postoperative nausea and vomiting)    Right upper quadrant pain 06/25/2018   Steatosis of liver 06/25/2018    Family History  Problem Relation Age of Onset   Hyperlipidemia Father    Heart attack Father    Heart attack Other    Hypertension Other    Cancer Other    Stroke Other    Hypertension Mother    Parkinson's disease Mother     Past Surgical History:  Procedure Laterality Date   CARPAL TUNNEL RELEASE Right 04/04/2018   Procedure: CARPAL TUNNEL RELEASE;  Surgeon: Ranee Gosselin, MD;  Location: [facility];  Service: Orthopedics;  Laterality: Right;    CHOLECYSTECTOMY     gastic sleeve  2015   HYSTEROSCOPY WITH NOVASURE N/A 03/29/2015   Procedure: HYSTEROSCOPY WITH NOVASURE;  Surgeon: Dara Lords, MD;  Location: [facility];  Service: Gynecology;  Laterality: N/A;  to follow first case around 10:15am.  Requests one hour OR time.   INTRAUTERINE DEVICE INSERTION     X 2  Spontaneously extruded X 2.   2014/2015   TUBAL LIGATION  approximately 2000   as an interval procedure historically   Social History   Occupational History   Not on file  Tobacco Use   Smoking status: Former   Smokeless tobacco: Never  Vaping Use   Vaping status: Never Used  Substance and Sexual Activity   Alcohol use: Yes    Comment: occas    Drug use: No   Sexual activity: Yes    Partners: Male    Birth control/protection: Surgical    Comment: BTL

## 2023-05-14 ENCOUNTER — Telehealth: Payer: Self-pay | Admitting: Neurology

## 2023-05-14 NOTE — Telephone Encounter (Signed)
Patient requesting sedative for MRI scheduled on March 11. Send to [facility] [phone]

## 2023-05-15 ENCOUNTER — Other Ambulatory Visit: Payer: Self-pay | Admitting: Neurology

## 2023-05-15 DIAGNOSIS — F600 Paranoid personality disorder: Secondary | ICD-10-CM | POA: Diagnosis not present

## 2023-05-15 MED ORDER — ALPRAZOLAM 1 MG PO TABS: 1.0000 mg | ORAL_TABLET | ORAL | 0 refills | Status: AC | PRN

## 2023-05-15 NOTE — Telephone Encounter (Signed)
 Received a faxed message on 05/08/23 from [facility]:  Patient declined to schedule due to cost/inability to use insurance. Patient was provided options including CareCredit and sliding scale. I am happy to see her in the future if she changes her mind- Dr Genia

## 2023-05-15 NOTE — Telephone Encounter (Addendum)
 LVM for patient to call the office to advise on where would like reschedule sent that would be in insurance network.

## 2023-05-15 NOTE — Telephone Encounter (Signed)
 Xanax sent to pharmacy.

## 2023-05-16 NOTE — Telephone Encounter (Signed)
 I called the number and it was not a direct line. I resent the fax to [facility] at [phone]

## 2023-05-16 NOTE — Telephone Encounter (Signed)
 Karen [name] from [facility] states she needs the authorization for the MRI of the brain. For pt, she can be called at [phone]

## 2023-05-20 ENCOUNTER — Encounter: Payer: BC Managed Care – PPO | Admitting: Rehabilitative and Restorative Service Providers"

## 2023-05-22 ENCOUNTER — Telehealth: Payer: Self-pay | Admitting: Orthopaedic Surgery

## 2023-05-22 NOTE — Telephone Encounter (Signed)
No restrictions

## 2023-05-22 NOTE — Telephone Encounter (Signed)
Please advise of patients work status. Form received. Thank you!

## 2023-05-23 DIAGNOSIS — R413 Other amnesia: Secondary | ICD-10-CM | POA: Diagnosis not present

## 2023-05-27 ENCOUNTER — Encounter: Payer: Self-pay | Admitting: Neurology

## 2023-05-27 NOTE — Therapy (Incomplete)
OUTPATIENT OCCUPATIONAL THERAPY ORTHO EVALUATION  Patient Name: Karen [name] MRN: [id number] DOB:22-Apr-1970, 53 y.o., female Today's Date: 05/27/2023  PCP: Gabriel Earing MD REFERRING PROVIDER:  Tarry Kos, MD    END OF SESSION:   Past Medical History:  Diagnosis Date   Acute pulmonary embolism (HCC) 06/26/2019   Arthritis    Left knee   Carpal tunnel syndrome of right wrist 02/10/2018   Essential hypertension 10/26/2016   Family history of early CAD    Gallstone 06/25/2018   Hypertension    Incidental lung nodule 06/26/2019   Menorrhagia 02/22/2015   Morbid obesity (HCC) 11/10/2012   BMI 63    Pain in right hand 01/27/2018   PONV (postoperative nausea and vomiting)    Right upper quadrant pain 06/25/2018   Steatosis of liver 06/25/2018   Past Surgical History:  Procedure Laterality Date   CARPAL TUNNEL RELEASE Right 04/04/2018   Procedure: CARPAL TUNNEL RELEASE;  Surgeon: Ranee Gosselin, MD;  Location: [facility];  Service: Orthopedics;  Laterality: Right;    CHOLECYSTECTOMY     gastic sleeve  2015   HYSTEROSCOPY WITH NOVASURE N/A 03/29/2015   Procedure: HYSTEROSCOPY WITH NOVASURE;  Surgeon: Dara Lords, MD;  Location: [facility];  Service: Gynecology;  Laterality: N/A;  to follow first case around 10:15am.  Requests one hour OR time.   INTRAUTERINE DEVICE INSERTION     X 2  Spontaneously extruded X 2.   2014/2015   TUBAL LIGATION  approximately 2000   as an interval procedure historically   Patient Active Problem List   Diagnosis Date Noted   Left carpal tunnel syndrome 02/15/2023   OSA (obstructive sleep apnea) 10/26/2019   Nocturnal hypoxemia due to obesity 10/26/2019   Depression 07/29/2019   Chronic anticoagulation 07/29/2019   Nocturnal dyspnea 07/29/2019   History of 2019 novel coronavirus disease (COVID-19) 07/29/2019   Right leg pain 07/29/2019   Brain fog 07/29/2019   Acute pulmonary embolism (HCC) 06/26/2019   Incidental lung nodule 06/26/2019    Steatosis of liver 06/25/2018   Carpal tunnel syndrome of right wrist 02/10/2018   Essential hypertension 10/26/2016   History of hypertension 10/26/2016   Menorrhagia 02/22/2015   Morbid obesity (HCC) 11/10/2012    ONSET DATE: ***  REFERRING DIAG: G56.02 (ICD-10-CM) - Left carpal tunnel syndrome   THERAPY DIAG:  No diagnosis found.  Rationale for Evaluation and Treatment: Rehabilitation  SUBJECTIVE:   SUBJECTIVE STATEMENT: Approx 8 weeks s/p Lt CTR. She states ***.   PERTINENT HISTORY: ***  PRECAUTIONS: {Therapy precautions:24002}  RED FLAGS: {PT Red Flags:29287}   WEIGHT BEARING RESTRICTIONS: {Yes ***/No:24003}  PAIN:  Are you having pain? {OPRCPAIN:27236}  FALLS: Has patient fallen in last 6 months? {fallsyesno:27318}  LIVING ENVIRONMENT: Lives with: {OPRC lives with:25569::"lives with their family"} Lives in: {Lives in:25570} Stairs: {opstairs:27293} Has following equipment at home: {Assistive devices:23999}  PLOF: {PLOF:24004}  PATIENT GOALS: ***  NEXT MD VISIT: ***   OBJECTIVE: (All objective assessments below are from initial evaluation on: 05/28/2023 unless otherwise specified.)   HAND DOMINANCE: Right ***  ADLs: Overall ADLs: States decreased ability to grab, hold household objects, pain and difficulty to open containers, perform FMS tasks (manipulate fasteners on clothing), mild to moderate bathing problems as well. ***   FUNCTIONAL OUTCOME MEASURES: Eval: Quick DASH ***% impairment today  (Higher % Score  =  More Impairment)    Patient Specific Functional Scale: *** (***, ***, ***)  (Higher Score  =  Better Ability for  the Selected Tasks)     Patient Rated Wrist Evaluation (PRWE): Pain: ***/50; Function: ***/50; Total Score: ***/100 (Higher Score  =  More Pain and/or Debility)    UPPER EXTREMITY ROM     Shoulder to Wrist AROM Right eval Left eval  Shoulder flexion    Shoulder abduction    Shoulder extension    Shoulder internal  rotation    Shoulder external rotation    Elbow flexion    Elbow extension    Forearm supination    Forearm pronation     Wrist flexion    Wrist extension    Wrist ulnar deviation    Wrist radial deviation    Functional dart thrower's motion (F-DTM) in ulnar flexion    F-DTM in radial extension     (Blank rows = not tested)   Hand AROM Right eval Left eval  Full Fist Ability (or Gap to Distal Palmar Crease)    Thumb Opposition  (Kapandji Scale)     Thumb MCP (0-60)    Thumb IP (0-80)    Thumb Radial Abduction Span     Thumb Palmar Abduction Span     Index MCP (0-90)     Index PIP (0-100)     Index DIP (0-70)      Long MCP (0-90)      Long PIP (0-100)      Long DIP (0-70)      Ring MCP (0-90)      Ring PIP (0-100)      Ring DIP (0-70)      Little MCP (0-90)      Little PIP (0-100)      Little DIP (0-70)      (Blank rows = not tested)   UPPER EXTREMITY MMT:    Eval: *** NT at eval due to recent and still healing injuries. Will be tested when appropriate.   MMT Right TBD Left TBD  Shoulder flexion    Shoulder abduction    Shoulder adduction    Shoulder extension    Shoulder internal rotation    Shoulder external rotation    Middle trapezius    Lower trapezius    Elbow flexion    Elbow extension    Forearm supination    Forearm pronation    Wrist flexion    Wrist extension    Wrist ulnar deviation    Wrist radial deviation    (Blank rows = not tested)  HAND FUNCTION: Eval: Observed weakness in affected *** hand.  Grip strength Right: *** lbs, Left: *** lbs   COORDINATION: Eval: Observed coordination impairments with affected *** hand. Box and Blocks Test: *** Blocks today (*** is [facility]); 9 Hole Peg Test Right: ***sec, Left: *** sec (*** sec is WFL)   SENSATION: Eval: *** Light touch intact today, though diminished around sx area    EDEMA:   Eval: *** Mildly swollen in *** hand and wrist today, ***cm circumferentially around  ***  COGNITION: Eval: Overall cognitive status: WFL for evaluation today ***  OBSERVATIONS:   Eval: ***   TODAY'S TREATMENT:  Post-evaluation treatment: ***   Modalities: {OPRCMODALITIES:31717}  PATIENT EDUCATION: Education details: See tx section above for details  Person educated: Patient Education method: Engineer, structural, Teach back, Handouts  Education comprehension: States and demonstrates understanding, Additional Education required    HOME EXERCISE PROGRAM: See tx section above for details    GOALS: Goals reviewed with patient? Yes   SHORT TERM GOALS: (STG required if POC>30  days) Target Date: ***  Pt will obtain protective, custom orthotic. Goal status: TBD/PRN,  MET ***  2.  Pt will demo/state understanding of initial HEP to improve pain levels and prerequisite motion. Goal status: INITIAL   LONG TERM GOALS: Target Date: ***  Pt will improve functional ability by decreased impairment per Quick DASH / PSFS / PRWE assessment from *** to *** or better, for better quality of life. Goal status: INITIAL  2.  Pt will improve grip strength in *** hand from ***lbs to at least ***lbs for functional use at home and in IADLs. Goal status: INITIAL  3.  Pt will improve A/ROM in *** from *** to at least ***, to have functional motion for tasks like reach and grasp.  Goal status: INITIAL  4.  Pt will improve strength in *** from *** MMT to at least *** MMT to have increased functional ability to carry out selfcare and higher-level homecare tasks with less difficulty. Goal status: INITIAL  5.  Pt will improve coordination skills in ***, as seen by within functional limit score on *** testing to have increased functional ability to carry out fine motor tasks (fasteners, etc.) and more complex, coordinated IADLs (meal prep, sports, etc.).  Goal status: INITIAL  6.  Pt will decrease pain at worst from ***/10 to ***/10 or better to have better sleep and occupational  participation in daily roles. Goal status: INITIAL   ASSESSMENT:  CLINICAL IMPRESSION: Patient is a 53y.o. female who was seen today for occupational therapy evaluation for ***.   PERFORMANCE DEFICITS: in functional skills including {OT physical skills:25468}, cognitive skills including {OT cognitive skills:25469}, and psychosocial skills including {OT psychosocial skills:25470}.   IMPAIRMENTS: are limiting patient from {OT performance deficits:25471}.   COMORBIDITIES: {Comorbidities:25485} that affects occupational performance. Patient will benefit from skilled OT to address above impairments and improve overall function.  MODIFICATION OR ASSISTANCE TO COMPLETE EVALUATION: {OT modification:25474}  OT OCCUPATIONAL PROFILE AND HISTORY: {OT PROFILE AND HISTORY:25484}  CLINICAL DECISION MAKING: {OT CDM:25475}  REHAB POTENTIAL: {rehabpotential:25112}  EVALUATION COMPLEXITY: {Evaluation complexity:25115}      PLAN:  OT FREQUENCY: {rehab frequency:25116}  OT DURATION: {rehab duration:25117}  PLANNED INTERVENTIONS: {OT Interventions:25467}  RECOMMENDED OTHER SERVICES: ***  CONSULTED AND AGREED WITH PLAN OF CARE: {GUR:42706}  PLAN FOR NEXT SESSION: ***   Fannie Knee, OTR/L, CHT 05/27/2023, 5:44 PM

## 2023-05-27 NOTE — Progress Notes (Signed)
MRI Brain completed at [facility] on 05/23/2023 1. No acute intracranial abnormality.  2. Small vermis, versus vermian atrophy.  3. Minimal ethmoid sinus mucosal thickening.

## 2023-05-28 ENCOUNTER — Encounter: Payer: BC Managed Care – PPO | Admitting: Rehabilitative and Restorative Service Providers"

## 2023-05-28 DIAGNOSIS — F600 Paranoid personality disorder: Secondary | ICD-10-CM | POA: Diagnosis not present

## 2023-05-29 ENCOUNTER — Encounter: Payer: Self-pay | Admitting: Family Medicine

## 2023-05-29 ENCOUNTER — Ambulatory Visit (INDEPENDENT_AMBULATORY_CARE_PROVIDER_SITE_OTHER): Payer: BC Managed Care – PPO | Admitting: Family Medicine

## 2023-05-29 VITALS — BP 124/77 | HR 87 | Temp 98.1°F | Resp 18 | Ht 63.0 in | Wt 258.0 lb

## 2023-05-29 DIAGNOSIS — Z6841 Body Mass Index (BMI) 40.0 and over, adult: Secondary | ICD-10-CM | POA: Diagnosis not present

## 2023-05-29 DIAGNOSIS — Z7689 Persons encountering health services in other specified circumstances: Secondary | ICD-10-CM | POA: Diagnosis not present

## 2023-05-29 DIAGNOSIS — E66813 Obesity, class 3: Secondary | ICD-10-CM | POA: Diagnosis not present

## 2023-05-29 MED ORDER — ZEPBOUND 2.5 MG/0.5ML ~~LOC~~ SOAJ: 2.5000 mg | SUBCUTANEOUS | 0 refills | Status: DC

## 2023-05-29 NOTE — Progress Notes (Unsigned)
Established Patient Office Visit  Subjective    Patient ID: Karen [name], female    DOB: [birth year]  Age: 53 y.o. MRN: [id number]  CC:  Chief Complaint  Patient presents with   Medication Refill    Weight check    HPI Karen [name] presents for routine weight management. Patient has not been on med for about 2-3 months. Would like to restart.   Outpatient Encounter Medications as of 05/29/2023  Medication Sig   albuterol (VENTOLIN HFA) 108 (90 Base) MCG/ACT inhaler TAKE 2 PUFFS BY MOUTH EVERY 6 HOURS AS NEEDED FOR WHEEZE OR SHORTNESS OF BREATH   ALPRAZolam (XANAX) 1 MG tablet Take 1 tablet (1 mg total) by mouth as needed for anxiety (To take prior to MRI).   amLODipine (NORVASC) 10 MG tablet TAKE 1 TABLET BY MOUTH EVERY DAY   butalbital-acetaminophen-caffeine (FIORICET) 50-325-40 MG tablet Take 50-235 tablets by mouth as needed.   Calcium Carbonate-Vitamin D (CALCIUM-VITAMIN D) 600-125 MG-UNIT TABS Take 1 tablet by mouth daily.   CREON 36000-114000 units CPEP capsule Take 1 capsule by mouth 3 (three) times daily with meals.   furosemide (LASIX) 40 MG tablet Take 1 tablet (40 mg total) by mouth once a week. Pt. Need to schedule an appt in order to receive future refills. Thank you. 2nd attempt.   gabapentin (NEURONTIN) 300 MG capsule START WITH 1 CAPSULE AT BEDTIME X 3 DAYS THEN 2 CAPS AT BEDTIME EVERY DAY   meclizine (ANTIVERT) 12.5 MG tablet Take 1 tablet (12.5 mg total) by mouth 3 (three) times daily as needed for dizziness.   meloxicam (MOBIC) 7.5 MG tablet Take 1 tablet (7.5 mg total) by mouth daily.   mirtazapine (REMERON) 15 MG tablet TAKE 1 TABLET BY MOUTH EVERYDAY AT BEDTIME   ondansetron (ZOFRAN) 4 MG tablet Take 1 tablet (4 mg total) by mouth every 8 (eight) hours as needed for nausea or vomiting.   ondansetron (ZOFRAN) 4 MG tablet Take 1 tablet (4 mg total) by mouth every 8 (eight) hours as needed for nausea or vomiting.   potassium chloride (KLOR-CON) 10 MEQ  tablet TAKE 1 TABLET EVERY MONDAY   scopolamine (TRANSDERM-SCOP) 1 MG/3DAYS Place 1 patch (1.5 mg total) onto the skin every 3 (three) days.   sucralfate (CARAFATE) 1 g tablet Take 1 tablet (1 g total) by mouth 4 (four) times daily -  with meals and at bedtime.   traMADol (ULTRAM) 50 MG tablet TAKE 1 TABLET (50 MG TOTAL) BY MOUTH EVERY 6 (SIX) HOURS AS NEEDED FOR UP TO 5 DAYS.   triamcinolone cream (KENALOG) 0.1 % APPLY 1 APPLICATION TOPICALLY TWICE A DAY AS NEEDED   Vitamin D, Ergocalciferol, (DRISDOL) 1.25 MG (50000 UNIT) CAPS capsule Take 1 capsule (50,000 Units total) by mouth every 7 (seven) days.   [DISCONTINUED] tirzepatide (ZEPBOUND) 2.5 MG/0.5ML Pen Inject 2.5 mg into the skin once a week.   tirzepatide (ZEPBOUND) 2.5 MG/0.5ML Pen Inject 2.5 mg into the skin once a week.   No facility-administered encounter medications on file as of 05/29/2023.    Past Medical History:  Diagnosis Date   Acute pulmonary embolism (HCC) 06/26/2019   Arthritis    Left knee   Carpal tunnel syndrome of right wrist 02/10/2018   Essential hypertension 10/26/2016   Family history of early CAD    Gallstone 06/25/2018   Hypertension    Incidental lung nodule 06/26/2019   Menorrhagia 02/22/2015   Morbid obesity (HCC) 11/10/2012   BMI 63  Pain in right hand 01/27/2018   PONV (postoperative nausea and vomiting)    Right upper quadrant pain 06/25/2018   Steatosis of liver 06/25/2018    Past Surgical History:  Procedure Laterality Date   CARPAL TUNNEL RELEASE Right 04/04/2018   Procedure: CARPAL TUNNEL RELEASE;  Surgeon: Ranee Gosselin, MD;  Location: [facility];  Service: Orthopedics;  Laterality: Right;    CHOLECYSTECTOMY     gastic sleeve  2015   HYSTEROSCOPY WITH NOVASURE N/A 03/29/2015   Procedure: HYSTEROSCOPY WITH NOVASURE;  Surgeon: Dara Lords, MD;  Location: [facility];  Service: Gynecology;  Laterality: N/A;  to follow first case around 10:15am.  Requests one hour OR time.   INTRAUTERINE  DEVICE INSERTION     X 2  Spontaneously extruded X 2.   2014/2015   TUBAL LIGATION  approximately 2000   as an interval procedure historically    Family History  Problem Relation Age of Onset   Hyperlipidemia Father    Heart attack Father    Heart attack Other    Hypertension Other    Cancer Other    Stroke Other    Hypertension Mother    Parkinson's disease Mother     Social History   Socioeconomic History   Marital status: Married    Spouse name: Not on file   Number of children: Not on file   Years of education: Not on file   Highest education level: Not on file  Occupational History   Not on file  Tobacco Use   Smoking status: Former   Smokeless tobacco: Never  Vaping Use   Vaping status: Never Used  Substance and Sexual Activity   Alcohol use: Yes    Comment: occas    Drug use: No   Sexual activity: Yes    Partners: Male    Birth control/protection: Surgical    Comment: BTL  Other Topics Concern   Not on file  Social History Narrative   Right handed   Lives with friend, two story home   Social Drivers of Health   Financial Resource Strain: Low Risk  (12/18/2022)   Overall Financial Resource Strain (CARDIA)    Difficulty of Paying Living Expenses: Not hard at all  Food Insecurity: No Food Insecurity (12/18/2022)   Hunger Vital Sign    Worried About Running Out of Food in the Last Year: Never true    Ran Out of Food in the Last Year: Never true  Transportation Needs: No Transportation Needs (12/18/2022)   PRAPARE - Administrator, Civil Service (Medical): No    Lack of Transportation (Non-Medical): No  Physical Activity: Inactive (12/18/2022)   Exercise Vital Sign    Days of Exercise per Week: 0 days    Minutes of Exercise per Session: 0 min  Stress: Stress Concern Present (12/18/2022)   [employer] of Occupational Health - Occupational Stress Questionnaire    Feeling of Stress : To some extent  Social Connections: Moderately  Integrated (12/18/2022)   Social Connection and Isolation Panel [NHANES]    Frequency of Communication with Friends and Family: More than three times a week    Frequency of Social Gatherings with Friends and Family: More than three times a week    Attends Religious Services: More than 4 times per year    Active Member of [employer] or Organizations: No    Attends Banker Meetings: Never    Marital Status: Married  Catering manager Violence: Not At  Risk (12/18/2022)   Humiliation, Afraid, Rape, and Kick questionnaire    Fear of Current or Ex-Partner: No    Emotionally Abused: No    Physically Abused: No    Sexually Abused: No    Review of Systems  All other systems reviewed and are negative.       Objective    BP 124/77 (BP Location: Right Arm, Patient Position: Sitting, Cuff Size: Large)   Pulse 87   Temp 98.1 F (36.7 C) (Oral)   Resp 18   Ht 5\' 3"  (1.6 m)   Wt 258 lb (117 kg)   SpO2 96%   BMI 45.70 kg/m   Physical Exam Vitals and nursing note reviewed.  Constitutional:      General: She is not in acute distress. Cardiovascular:     Rate and Rhythm: Normal rate and regular rhythm.  Pulmonary:     Effort: Pulmonary effort is normal.     Breath sounds: Normal breath sounds.  Abdominal:     Palpations: Abdomen is soft.     Tenderness: There is no abdominal tenderness.  Neurological:     General: No focal deficit present.     Mental Status: She is alert and oriented to person, place, and time.         Assessment & Plan:   1. Encounter for weight management (Primary)   2. Class 3 severe obesity due to excess calories without serious comorbidity with body mass index (BMI) of 45.0 to 49.9 in adult [facility]) Zepbound prescribed   Return in about 4 weeks (around 06/26/2023) for follow up, weight management.   Tommie Raymond, MD

## 2023-05-30 ENCOUNTER — Encounter: Payer: Self-pay | Admitting: Family Medicine

## 2023-06-11 ENCOUNTER — Ambulatory Visit: Payer: Self-pay | Admitting: Family Medicine

## 2023-06-11 DIAGNOSIS — F600 Paranoid personality disorder: Secondary | ICD-10-CM | POA: Diagnosis not present

## 2023-06-11 NOTE — Telephone Encounter (Signed)
  Chief Complaint: urinary frequency , pain with urination since 06/07/23. Requesting medication  Symptoms: urinary frequency, pain with urination.  Frequency: 06/07/23 Pertinent Negatives: Patient denies fever, no blood in urine  Disposition: [] ED /[] Urgent Care (no appt availability in office) / [x] Appointment(In office/virtual)/ []  [facility]/ [] Home Care/ [] Refused Recommended Disposition /[] [facility]/ []  Follow-up with PCP Additional Notes:   No available appt with PCP . My chart VV scheduled with provider at [facility] -  [facility] offered mobile bus address. Patient reports she has to work and just came off of leave and can not take off to come to [place].      Copied from CRM [id number]. Topic: Clinical - Red Word Triage >> Jun 11, 2023 11:43 AM Everette Rank wrote: Red Word that prompted transfer to Nurse Triage: Patient Kidney infection/Bladdar infections since 02/28 Reason for Disposition  Age > 50 years  Answer Assessment - Initial Assessment Questions 1. SEVERITY: "How bad is the pain?"  (e.g., Scale 1-10; mild, moderate, or severe)   - MILD (1-3): complains slightly about urination hurting   - MODERATE (4-7): interferes with normal activities     - SEVERE (8-10): excruciating, unwilling or unable to urinate because of the pain      Pain with every urination  2. FREQUENCY: "How many times have you had painful urination today?"      Na  3. PATTERN: "Is pain present every time you urinate or just sometimes?"      Na  4. ONSET: "When did the painful urination start?"      On going since 06/07/23 5. FEVER: "Do you have a fever?" If Yes, ask: "What is your temperature, how was it measured, and when did it start?"     na 6. PAST UTI: "Have you had a urine infection before?" If Yes, ask: "When was the last time?" and "What happened that time?"      Yes  7. CAUSE: "What do you think is causing the painful urination?"  (e.g., UTI, scratch, Herpes sore)     Bladder infection per  patient 8. OTHER SYMPTOMS: "Do you have any other symptoms?" (e.g., blood in urine, flank pain, genital sores, urgency, vaginal discharge)     Urgency , frequency pain with urination 9. PREGNANCY: "Is there any chance you are pregnant?" "When was your last menstrual period?"     na  Protocols used: Urination Pain - Female-A-AH

## 2023-06-12 ENCOUNTER — Telehealth (INDEPENDENT_AMBULATORY_CARE_PROVIDER_SITE_OTHER): Admitting: Family Medicine

## 2023-06-12 ENCOUNTER — Encounter: Payer: Self-pay | Admitting: Family Medicine

## 2023-06-12 DIAGNOSIS — N390 Urinary tract infection, site not specified: Secondary | ICD-10-CM | POA: Diagnosis not present

## 2023-06-12 MED ORDER — CIPROFLOXACIN HCL 500 MG PO TABS: 500.0000 mg | ORAL_TABLET | Freq: Two times a day (BID) | ORAL | 0 refills | Status: AC

## 2023-06-12 NOTE — Progress Notes (Signed)
 Subjective:    Patient ID: Karen [name], female    DOB: [birth year], 53 y.o.   MRN: [id number]  HPI Virtual Visit via Video Note  I connected with the patient on 06/12/23 at  8:45 AM EST by a video enabled telemedicine application and verified that I am speaking with the correct person using two identifiers.  Location patient: home Location provider:work or home office Persons participating in the virtual visit: patient, provider  I discussed the limitations of evaluation and management by telemedicine and the availability of in person appointments. The patient expressed understanding and agreed to proceed.   HPI: Here for what she thinks is a UTI. She has not had one of these for some time. For the past 6 days she has had urgency and frequency of urination, and she has burning toward the end of urination. No fever or back pain.    ROS: See pertinent positives and negatives per HPI.  Past Medical History:  Diagnosis Date   Acute pulmonary embolism (HCC) 06/26/2019   Arthritis    Left knee   Carpal tunnel syndrome of right wrist 02/10/2018   Essential hypertension 10/26/2016   Family history of early CAD    Gallstone 06/25/2018   Hypertension    Incidental lung nodule 06/26/2019   Menorrhagia 02/22/2015   Morbid obesity (HCC) 11/10/2012   BMI 63    Pain in right hand 01/27/2018   PONV (postoperative nausea and vomiting)    Right upper quadrant pain 06/25/2018   Steatosis of liver 06/25/2018    Past Surgical History:  Procedure Laterality Date   CARPAL TUNNEL RELEASE Right 04/04/2018   Procedure: CARPAL TUNNEL RELEASE;  Surgeon: Ranee Gosselin, MD;  Location: [facility];  Service: Orthopedics;  Laterality: Right;    CHOLECYSTECTOMY     gastic sleeve  2015   HYSTEROSCOPY WITH NOVASURE N/A 03/29/2015   Procedure: HYSTEROSCOPY WITH NOVASURE;  Surgeon: Dara Lords, MD;  Location: [facility];  Service: Gynecology;  Laterality: N/A;  to follow first case around  10:15am.  Requests one hour OR time.   INTRAUTERINE DEVICE INSERTION     X 2  Spontaneously extruded X 2.   2014/2015   TUBAL LIGATION  approximately 2000   as an interval procedure historically    Family History  Problem Relation Age of Onset   Hyperlipidemia Father    Heart attack Father    Heart attack Other    Hypertension Other    Cancer Other    Stroke Other    Hypertension Mother    Parkinson's disease Mother      Current Outpatient Medications:    albuterol (VENTOLIN HFA) 108 (90 Base) MCG/ACT inhaler, TAKE 2 PUFFS BY MOUTH EVERY 6 HOURS AS NEEDED FOR WHEEZE OR SHORTNESS OF BREATH, Disp: 8.5 each, Rfl: 0   ALPRAZolam (XANAX) 1 MG tablet, Take 1 tablet (1 mg total) by mouth as needed for anxiety (To take prior to MRI)., Disp: 2 tablet, Rfl: 0   amLODipine (NORVASC) 10 MG tablet, TAKE 1 TABLET BY MOUTH EVERY DAY, Disp: 90 tablet, Rfl: 1   butalbital-acetaminophen-caffeine (FIORICET) 50-325-40 MG tablet, Take 50-235 tablets by mouth as needed., Disp: , Rfl:    Calcium Carbonate-Vitamin D (CALCIUM-VITAMIN D) 600-125 MG-UNIT TABS, Take 1 tablet by mouth daily., Disp: , Rfl:    CREON 36000-114000 units CPEP capsule, Take 1 capsule by mouth 3 (three) times daily with meals., Disp: , Rfl:    furosemide (LASIX) 40 MG tablet, Take  1 tablet (40 mg total) by mouth once a week. Pt. Need to schedule an appt in order to receive future refills. Thank you. 2nd attempt., Disp: 2 tablet, Rfl: 0   gabapentin (NEURONTIN) 300 MG capsule, START WITH 1 CAPSULE AT BEDTIME X 3 DAYS THEN 2 CAPS AT BEDTIME EVERY DAY, Disp: , Rfl:    meclizine (ANTIVERT) 12.5 MG tablet, Take 1 tablet (12.5 mg total) by mouth 3 (three) times daily as needed for dizziness., Disp: 30 tablet, Rfl: 0   meloxicam (MOBIC) 7.5 MG tablet, Take 1 tablet (7.5 mg total) by mouth daily., Disp: 30 tablet, Rfl: 0   mirtazapine (REMERON) 15 MG tablet, TAKE 1 TABLET BY MOUTH EVERYDAY AT BEDTIME, Disp: 90 tablet, Rfl: 1   ondansetron  (ZOFRAN) 4 MG tablet, Take 1 tablet (4 mg total) by mouth every 8 (eight) hours as needed for nausea or vomiting., Disp: 30 tablet, Rfl: 0   ondansetron (ZOFRAN) 4 MG tablet, Take 1 tablet (4 mg total) by mouth every 8 (eight) hours as needed for nausea or vomiting., Disp: 40 tablet, Rfl: 0   potassium chloride (KLOR-CON) 10 MEQ tablet, TAKE 1 TABLET EVERY MONDAY, Disp: 2 tablet, Rfl: 0   scopolamine (TRANSDERM-SCOP) 1 MG/3DAYS, Place 1 patch (1.5 mg total) onto the skin every 3 (three) days., Disp: 4 patch, Rfl: 0   sucralfate (CARAFATE) 1 g tablet, Take 1 tablet (1 g total) by mouth 4 (four) times daily -  with meals and at bedtime., Disp: 90 tablet, Rfl: 0   tirzepatide (ZEPBOUND) 2.5 MG/0.5ML Pen, Inject 2.5 mg into the skin once a week., Disp: 2 mL, Rfl: 0   traMADol (ULTRAM) 50 MG tablet, TAKE 1 TABLET (50 MG TOTAL) BY MOUTH EVERY 6 (SIX) HOURS AS NEEDED FOR UP TO 5 DAYS., Disp: , Rfl:    triamcinolone cream (KENALOG) 0.1 %, APPLY 1 APPLICATION TOPICALLY TWICE A DAY AS NEEDED, Disp: 454 g, Rfl: 0   Vitamin D, Ergocalciferol, (DRISDOL) 1.25 MG (50000 UNIT) CAPS capsule, Take 1 capsule (50,000 Units total) by mouth every 7 (seven) days., Disp: 12 capsule, Rfl: 0  EXAM:  VITALS per patient if applicable:  GENERAL: alert, oriented, appears well and in no acute distress  HEENT: atraumatic, conjunttiva clear, no obvious abnormalities on inspection of external nose and ears  NECK: normal movements of the head and neck  LUNGS: on inspection no signs of respiratory distress, breathing rate appears normal, no obvious gross SOB, gasping or wheezing  CV: no obvious cyanosis  MS: moves all visible extremities without noticeable abnormality  PSYCH/NEURO: pleasant and cooperative, no obvious depression or anxiety, speech and thought processing grossly intact  ASSESSMENT AND PLAN: UTI, treat with 7 days of Cipro. Drink plenty of water.  Gershon Crane, MD  Discussed the following assessment and  plan:  No diagnosis found.     I discussed the assessment and treatment plan with the patient. The patient was provided an opportunity to ask questions and all were answered. The patient agreed with the plan and demonstrated an understanding of the instructions.   The patient was advised to call back or seek an in-person evaluation if the symptoms worsen or if the condition fails to improve as anticipated.      Review of Systems     Objective:   Physical Exam        Assessment & Plan:

## 2023-06-25 DIAGNOSIS — F600 Paranoid personality disorder: Secondary | ICD-10-CM | POA: Diagnosis not present

## 2023-06-28 ENCOUNTER — Ambulatory Visit: Admitting: Sports Medicine

## 2023-06-28 ENCOUNTER — Encounter: Payer: Self-pay | Admitting: Sports Medicine

## 2023-06-28 ENCOUNTER — Other Ambulatory Visit: Payer: Self-pay

## 2023-06-28 DIAGNOSIS — M25561 Pain in right knee: Secondary | ICD-10-CM

## 2023-06-28 DIAGNOSIS — G8929 Other chronic pain: Secondary | ICD-10-CM | POA: Diagnosis not present

## 2023-06-28 DIAGNOSIS — M170 Bilateral primary osteoarthritis of knee: Secondary | ICD-10-CM | POA: Diagnosis not present

## 2023-06-28 DIAGNOSIS — M1711 Unilateral primary osteoarthritis, right knee: Secondary | ICD-10-CM

## 2023-06-28 DIAGNOSIS — M1712 Unilateral primary osteoarthritis, left knee: Secondary | ICD-10-CM

## 2023-06-28 DIAGNOSIS — M25562 Pain in left knee: Secondary | ICD-10-CM | POA: Diagnosis not present

## 2023-06-28 MED ORDER — MELOXICAM 7.5 MG PO TABS
7.5000 mg | ORAL_TABLET | Freq: Every day | ORAL | 0 refills | Status: DC
Start: 2023-06-28 — End: 2023-07-29

## 2023-06-28 MED ORDER — LIDOCAINE HCL 1 % IJ SOLN
2.0000 mL | INTRAMUSCULAR | Status: AC | PRN
  Administered 2023-06-28: 2 mL

## 2023-06-28 MED ORDER — BUPIVACAINE HCL 0.25 % IJ SOLN
2.0000 mL | INTRAMUSCULAR | Status: AC | PRN
Start: 2023-06-28 — End: 2023-06-28
  Administered 2023-06-28: 2 mL via INTRA_ARTICULAR

## 2023-06-28 MED ORDER — METHYLPREDNISOLONE ACETATE 40 MG/ML IJ SUSP
40.0000 mg | INTRAMUSCULAR | Status: AC | PRN
  Administered 2023-06-28: 40 mg via INTRA_ARTICULAR

## 2023-06-28 MED ORDER — BUPIVACAINE HCL 0.25 % IJ SOLN
2.0000 mL | INTRAMUSCULAR | Status: AC | PRN
  Administered 2023-06-28: 2 mL via INTRA_ARTICULAR

## 2023-06-28 NOTE — Progress Notes (Addendum)
 Karen [name] - 53 y.o. female MRN [id number]  Date of birth: 12-Feb-1971  Office Visit Note: Visit Date: 06/28/2023 PCP: Georganna Skeans, MD Referred by: Georganna Skeans, MD  Subjective: Chief Complaint  Patient presents with   Right Knee - Pain   Left Knee - Pain   HPI: Karen [name] is a pleasant 53 y.o. female who presents today for for bilateral knee pain with known osteoarthritis.   We did perform ultrasound-guided knee injections into each knee back in July 2024 which lasted her quiite a good while, until here recently near the end of February she feels like her arthritis and knee pain has become exacerbated again.  She is working on healthy weight loss both to offload her knees and if at some point she ever would require knee replacement.  However, given the long-term benefit of the injections, she is not looking to perform this anytime soon.  In the past, she has been on meloxicam 7.5 mg daily just as needed from an outside provider.  We discussed possibly refilling this.  Upon chart review, she has had a gastric sleeve about 4 years ago and has been told to be cautious with NSAIDs.  She is a diabetic, but very well-controlled. Recently A1c from 07/12/22 was:  Lab Results  Component Value Date   HGBA1C 4.8 07/12/2022   Pertinent ROS were reviewed with the patient and found to be negative unless otherwise specified above in HPI.   Assessment & Plan: Visit Diagnoses:  1. Chronic pain of both knees   2. Unilateral primary osteoarthritis, left knee   3. Unilateral primary osteoarthritis, right knee    Plan: Impression is chronic bilateral knee pain with severe osteoarthritis, left greater than right with exacerbation.  She has received great relief from prior ultrasound-guided injections which was about 9 months ago, through shared decision making we did repeat these today under ultrasound guidance, patient tolerated well.  Advised on postinjection protocol.  She will  continue on healthy weight loss to help offload the knees.  In the past she had been prescribed meloxicam 7.5 mg which she took very infrequently only for bad days.  She does have a history of a gastric sleeve and does need to be cautious with NSAIDs but given the low dose I would be okay with prescribing this for her to be taken only very infrequently.  I did send this prescription in today, but would like her to clear this with her primary care physician prior to picking this up.  She is agreeable and understanding.  She will follow-up with me as needed, did discuss the role for infrequent injections only if needed.  Follow-up: Return if symptoms worsen or fail to improve.   Meds & Orders:  Meds ordered this encounter  Medications   meloxicam (MOBIC) 7.5 MG tablet    Sig: Take 1 tablet (7.5 mg total) by mouth daily.    Dispense:  30 tablet    Refill:  0   bupivacaine (MARCAINE) 0.25 % (with pres) injection 2 mL   bupivacaine (MARCAINE) 0.25 % (with pres) injection 2 mL   lidocaine (XYLOCAINE) 1 % (with pres) injection 2 mL   lidocaine (XYLOCAINE) 1 % (with pres) injection 2 mL   methylPREDNISolone acetate (DEPO-MEDROL) injection 40 mg   methylPREDNISolone acetate (DEPO-MEDROL) injection 40 mg    Orders Placed This Encounter  Procedures   Large Joint Inj: R knee   Large Joint Inj: L knee   US Guided Needle  Placement - No Linked Charges     Procedures: Large Joint Inj: R knee on 06/28/2023 2:49 PM Indications: pain Details: 22 G 1.5 in needle, ultrasound-guided anterolateral approach Medications: 2 mL lidocaine 1 %; 2 mL bupivacaine 0.25 %; 40 mg methylPREDNISolone acetate 40 MG/ML Outcome: tolerated well, no immediate complications  Procedure: Ultrasound-guided Knee Injection, Right After discussion on risk/benefits/indication, informed verbal consent was obtained. A timeout was then performed. The patient was lying in supine on examination table with a small bolster underneath the  affected knee for comfort. The patient's knee was prepped with Betadine and alcohol swabs. Utilizing ultrasound-guidance with the probe in a transverse position, the patient's suprapatellar bursa was identified and the knee joint was subsequently injected intraarticularly with a mixture of 2:2:1 lidocaine:bupivicaine:depomedrol utilizing an in-plane visualization approach. Patient tolerated the procedure well without immediate complications.  Procedure, treatment alternatives, risks and benefits explained, specific risks discussed. Consent was given by the patient. Patient was prepped and draped in the usual sterile fashion.    Large Joint Inj: L knee on 06/28/2023 2:50 PM Indications: pain Details: 22 G 1.5 in needle, ultrasound-guided anterolateral approach Medications: 2 mL lidocaine 1 %; 2 mL bupivacaine 0.25 %; 40 mg methylPREDNISolone acetate 40 MG/ML Outcome: tolerated well, no immediate complications  Procedure: Ultrasound-guided Knee Injection, Left After discussion on risk/benefits/indication, informed verbal consent was obtained. A timeout was then performed. The patient was lying in supine on examination table with a small bolster underneath the affected knee for comfort. The patient's knee was prepped with Betadine and alcohol swabs. Utilizing ultrasound-guidance with the probe in a transverse position, the patient's suprapatellar bursa was identified and the knee joint was subsequently injected intraarticularly with a mixture of 2:2:1 lidocaine:bupivicaine:depomedrol utilizing an in-plane visualization approach. Patient tolerated the procedure well without immediate complications. Procedure, treatment alternatives, risks and benefits explained, specific risks discussed. Consent was given by the patient. Patient was prepped and draped in the usual sterile fashion.          Clinical History: No specialty comments available.  She reports that she has quit smoking. She has never used  smokeless tobacco.  Recent Labs    07/12/22 1147  HGBA1C 4.8    Objective:    Physical Exam  Gen: Well-appearing, in no acute distress; non-toxic CV: Well-perfused. Warm.  Resp: Breathing unlabored on room air; no wheezing. Psych: Fluid speech in conversation; appropriate affect; normal thought process  Ortho Exam - Bilateral knees: There is no redness swelling or effusion about the knee joint.  She does walk with a slightly antalgic gait.  No other changes from prior examination.  Imaging:  - 06/12/22:  2 view radiographs of her left knee were obtained today.  She has varus  alignment with bone-on-bone degenerative changes and periarticular  osteophytes especially the of the medial compartment but also the  patellofemoral joint and lateral compartment.  No acute changes   Three-view x-rays of her right knee were taken today.  She has advanced  tricompartmental arthritis with varus alignment.  Almost bone-on-bone in  the medial compartment bone-on-bone patellofemoral compartment no acute  changes   Past Medical/Family/Surgical/Social History: Medications & Allergies reviewed per EMR, new medications updated. Patient Active Problem List   Diagnosis Date Noted   Left carpal tunnel syndrome 02/15/2023   OSA (obstructive sleep apnea) 10/26/2019   Nocturnal hypoxemia due to obesity 10/26/2019   Depression 07/29/2019   Chronic anticoagulation 07/29/2019   Nocturnal dyspnea 07/29/2019   History of 2019 novel coronavirus disease (  COVID-19) 07/29/2019   Right leg pain 07/29/2019   Brain fog 07/29/2019   Acute pulmonary embolism (HCC) 06/26/2019   Incidental lung nodule 06/26/2019   Steatosis of liver 06/25/2018   Carpal tunnel syndrome of right wrist 02/10/2018   Essential hypertension 10/26/2016   History of hypertension 10/26/2016   Menorrhagia 02/22/2015   Morbid obesity (HCC) 11/10/2012   Past Medical History:  Diagnosis Date   Acute pulmonary embolism (HCC) 06/26/2019    Arthritis    Left knee   Carpal tunnel syndrome of right wrist 02/10/2018   Essential hypertension 10/26/2016   Family history of early CAD    Gallstone 06/25/2018   Hypertension    Incidental lung nodule 06/26/2019   Menorrhagia 02/22/2015   Morbid obesity (HCC) 11/10/2012   BMI 63    Pain in right hand 01/27/2018   PONV (postoperative nausea and vomiting)    Right upper quadrant pain 06/25/2018   Steatosis of liver 06/25/2018   Family History  Problem Relation Age of Onset   Hyperlipidemia Father    Heart attack Father    Heart attack Other    Hypertension Other    Cancer Other    Stroke Other    Hypertension Mother    Parkinson's disease Mother    Past Surgical History:  Procedure Laterality Date   CARPAL TUNNEL RELEASE Right 04/04/2018   Procedure: CARPAL TUNNEL RELEASE;  Surgeon: Ranee Gosselin, MD;  Location: [facility];  Service: Orthopedics;  Laterality: Right;    CHOLECYSTECTOMY     gastic sleeve  2015   HYSTEROSCOPY WITH NOVASURE N/A 03/29/2015   Procedure: HYSTEROSCOPY WITH NOVASURE;  Surgeon: Dara Lords, MD;  Location: [facility];  Service: Gynecology;  Laterality: N/A;  to follow first case around 10:15am.  Requests one hour OR time.   INTRAUTERINE DEVICE INSERTION     X 2  Spontaneously extruded X 2.   2014/2015   TUBAL LIGATION  approximately 2000   as an interval procedure historically   Social History   Occupational History   Not on file  Tobacco Use   Smoking status: Former   Smokeless tobacco: Never  Vaping Use   Vaping status: Never Used  Substance and Sexual Activity   Alcohol use: Yes    Comment: occas    Drug use: No   Sexual activity: Yes    Partners: Male    Birth control/protection: Surgical    Comment: BTL

## 2023-07-16 DIAGNOSIS — F600 Paranoid personality disorder: Secondary | ICD-10-CM | POA: Diagnosis not present

## 2023-07-17 ENCOUNTER — Other Ambulatory Visit: Payer: Self-pay | Admitting: Family Medicine

## 2023-07-17 NOTE — Telephone Encounter (Signed)
 OV 2/169/25 Requested Prescriptions  Pending Prescriptions Disp Refills   mirtazapine (REMERON) 15 MG tablet [Pharmacy Med Name: MIRTAZAPINE 15 MG TABLET] 90 tablet 1    Sig: TAKE 1 TABLET BY MOUTH EVERYDAY AT BEDTIME     Psychiatry: Antidepressants - mirtazapine Passed - 07/17/2023  3:39 PM      Passed - Completed PHQ-2 or PHQ-9 in the last 360 days      Passed - Valid encounter within last 6 months    Recent Outpatient Visits           1 month ago Encounter for weight management   [facility], MD   5 months ago Encounter for weight management   [place] [facility], MD   7 months ago COVID-19 long hauler   [place] [facility], MD   1 year ago Annual physical exam   [place] [facility], MD   1 year ago COVID-19 long hauler manifesting chronic anxiety   [place] [facility], MD               amLODipine (NORVASC) 10 MG tablet [Pharmacy Med Name: AMLODIPINE BESYLATE 10 MG TAB] 90 tablet 1    Sig: TAKE 1 TABLET BY MOUTH EVERY DAY     Cardiovascular: Calcium Channel Blockers 2 Passed - 07/17/2023  3:39 PM      Passed - Last BP in normal range    BP Readings from Last 1 Encounters:  05/29/23 124/77         Passed - Last Heart Rate in normal range    Pulse Readings from Last 1 Encounters:  05/29/23 87         Passed - Valid encounter within last 6 months    Recent Outpatient Visits           1 month ago Encounter for weight management   [place] [facility], MD   5 months ago Encounter for weight management   [place] [facility], MD   7 months ago COVID-19 long hauler   [place] [facility], MD   1 year ago Annual physical exam   [place] [facility], MD   1 year ago COVID-19 long hauler manifesting chronic anxiety   [place] [facility], MD

## 2023-07-26 ENCOUNTER — Other Ambulatory Visit: Payer: Self-pay | Admitting: Sports Medicine

## 2023-08-06 DIAGNOSIS — F600 Paranoid personality disorder: Secondary | ICD-10-CM | POA: Diagnosis not present

## 2023-08-21 ENCOUNTER — Telehealth: Payer: Self-pay

## 2023-08-21 NOTE — Telephone Encounter (Unsigned)
 Copied from CRM [id number]. Topic: Appointments - Scheduling Inquiry for Clinic >> Aug 20, 2023  8:21 AM Baldemar Lev wrote: Reason for CRM: Pt says she does not need an appt, she says she just needs a scheduled time to come and get weighed for medication refills.   Best contact: [phone]

## 2023-08-21 NOTE — Telephone Encounter (Signed)
 Wanting refills on zepbound 

## 2023-08-22 NOTE — Telephone Encounter (Signed)
 Please schedule

## 2023-08-23 ENCOUNTER — Encounter: Payer: Self-pay | Admitting: Family Medicine

## 2023-08-23 ENCOUNTER — Ambulatory Visit: Admitting: Family Medicine

## 2023-08-23 VITALS — BP 126/81 | HR 84 | Wt 254.4 lb

## 2023-08-23 DIAGNOSIS — E669 Obesity, unspecified: Secondary | ICD-10-CM | POA: Diagnosis not present

## 2023-08-23 DIAGNOSIS — Z7689 Persons encountering health services in other specified circumstances: Secondary | ICD-10-CM

## 2023-08-23 DIAGNOSIS — E66813 Obesity, class 3: Secondary | ICD-10-CM | POA: Diagnosis not present

## 2023-08-23 DIAGNOSIS — Z6841 Body Mass Index (BMI) 40.0 and over, adult: Secondary | ICD-10-CM | POA: Diagnosis not present

## 2023-08-23 NOTE — Telephone Encounter (Signed)
 Pt scheduled and seen.

## 2023-08-27 ENCOUNTER — Other Ambulatory Visit: Payer: Self-pay | Admitting: Family Medicine

## 2023-08-27 ENCOUNTER — Encounter: Payer: Self-pay | Admitting: Family Medicine

## 2023-08-27 MED ORDER — TIRZEPATIDE-WEIGHT MANAGEMENT 5 MG/0.5ML ~~LOC~~ SOLN: 5.0000 mg | SUBCUTANEOUS | 0 refills | Status: DC

## 2023-08-27 NOTE — Progress Notes (Signed)
 Established Patient Office Visit  Subjective    Patient ID: Karen [name], female    DOB: 07/08/1970  Age: 53 y.o. MRN: [id number]  CC:  Chief Complaint  Patient presents with   Medication Refill    HPI Eyvette KALEESI GUYTON presents for routine weight management. She has lost some weight and would like to increase meds   Outpatient Encounter Medications as of 08/23/2023  Medication Sig   albuterol  (VENTOLIN  HFA) 108 (90 Base) MCG/ACT inhaler TAKE 2 PUFFS BY MOUTH EVERY 6 HOURS AS NEEDED FOR WHEEZE OR SHORTNESS OF BREATH   ALPRAZolam  (XANAX ) 1 MG tablet Take 1 tablet (1 mg total) by mouth as needed for anxiety (To take prior to MRI).   amLODipine  (NORVASC ) 10 MG tablet TAKE 1 TABLET BY MOUTH EVERY DAY   butalbital -acetaminophen -caffeine  (FIORICET) 50-325-40 MG tablet Take 50-235 tablets by mouth as needed.   Calcium  Carbonate-Vitamin D  (CALCIUM -VITAMIN D ) 600-125 MG-UNIT TABS Take 1 tablet by mouth daily.   ciprofloxacin  (CIPRO ) 500 MG tablet Take 1 tablet (500 mg total) by mouth 2 (two) times daily.   CREON 36000-114000 units CPEP capsule Take 1 capsule by mouth 3 (three) times daily with meals.   furosemide  (LASIX ) 40 MG tablet Take 1 tablet (40 mg total) by mouth once a week. Pt. Need to schedule an appt in order to receive future refills. Thank you. 2nd attempt.   gabapentin  (NEURONTIN ) 300 MG capsule START WITH 1 CAPSULE AT BEDTIME X 3 DAYS THEN 2 CAPS AT BEDTIME EVERY DAY   meclizine  (ANTIVERT ) 12.5 MG tablet Take 1 tablet (12.5 mg total) by mouth 3 (three) times daily as needed for dizziness.   meloxicam  (MOBIC ) 7.5 MG tablet TAKE 1 TABLET BY MOUTH EVERY DAY   mirtazapine  (REMERON ) 15 MG tablet TAKE 1 TABLET BY MOUTH EVERYDAY AT BEDTIME   ondansetron  (ZOFRAN ) 4 MG tablet Take 1 tablet (4 mg total) by mouth every 8 (eight) hours as needed for nausea or vomiting.   ondansetron  (ZOFRAN ) 4 MG tablet Take 1 tablet (4 mg total) by mouth every 8 (eight) hours as needed for nausea or  vomiting.   potassium chloride  (KLOR-CON ) 10 MEQ tablet TAKE 1 TABLET EVERY MONDAY   scopolamine  (TRANSDERM-SCOP) 1 MG/3DAYS Place 1 patch (1.5 mg total) onto the skin every 3 (three) days.   sucralfate  (CARAFATE ) 1 g tablet Take 1 tablet (1 g total) by mouth 4 (four) times daily -  with meals and at bedtime.   tirzepatide  (ZEPBOUND ) 2.5 MG/0.5ML Pen Inject 2.5 mg into the skin once a week.   tirzepatide  5 MG/0.5ML injection vial Inject 5 mg into the skin once a week.   traMADol  (ULTRAM ) 50 MG tablet TAKE 1 TABLET (50 MG TOTAL) BY MOUTH EVERY 6 (SIX) HOURS AS NEEDED FOR UP TO 5 DAYS.   triamcinolone  cream (KENALOG ) 0.1 % APPLY 1 APPLICATION TOPICALLY TWICE A DAY AS NEEDED   Vitamin D , Ergocalciferol , (DRISDOL ) 1.25 MG (50000 UNIT) CAPS capsule Take 1 capsule (50,000 Units total) by mouth every 7 (seven) days.   No facility-administered encounter medications on file as of 08/23/2023.    Past Medical History:  Diagnosis Date   Acute pulmonary embolism (HCC) 06/26/2019   Arthritis    Left knee   Carpal tunnel syndrome of right wrist 02/10/2018   Essential hypertension 10/26/2016   Family history of early CAD    Gallstone 06/25/2018   Hypertension    Incidental lung nodule 06/26/2019   Menorrhagia 02/22/2015   Morbid obesity (  HCC) 11/10/2012   BMI 63    Pain in right hand 01/27/2018   PONV (postoperative nausea and vomiting)    Right upper quadrant pain 06/25/2018   Steatosis of liver 06/25/2018    Past Surgical History:  Procedure Laterality Date   CARPAL TUNNEL RELEASE Right 04/04/2018   Procedure: CARPAL TUNNEL RELEASE;  Surgeon: Hazle Lites, MD;  Location: [facility];  Service: Orthopedics;  Laterality: Right;    CHOLECYSTECTOMY     gastic sleeve  2015   HYSTEROSCOPY WITH NOVASURE N/A 03/29/2015   Procedure: HYSTEROSCOPY WITH NOVASURE;  Surgeon: Lacretia Piccolo, MD;  Location: [facility];  Service: Gynecology;  Laterality: N/A;  to follow first case around 10:15am.  Requests one  hour OR time.   INTRAUTERINE DEVICE INSERTION     X 2  Spontaneously extruded X 2.   2014/2015   TUBAL LIGATION  approximately 2000   as an interval procedure historically    Family History  Problem Relation Age of Onset   Hyperlipidemia Father    Heart attack Father    Heart attack Other    Hypertension Other    Cancer Other    Stroke Other    Hypertension Mother    Parkinson's disease Mother     Social History   Socioeconomic History   Marital status: Married    Spouse name: Not on file   Number of children: Not on file   Years of education: Not on file   Highest education level: Not on file  Occupational History   Not on file  Tobacco Use   Smoking status: Former   Smokeless tobacco: Never  Vaping Use   Vaping status: Never Used  Substance and Sexual Activity   Alcohol use: Yes    Comment: occas    Drug use: No   Sexual activity: Yes    Partners: Male    Birth control/protection: Surgical    Comment: BTL  Other Topics Concern   Not on file  Social History Narrative   Right handed   Lives with friend, two story home   Social Drivers of Health   Financial Resource Strain: Low Risk  (12/18/2022)   Overall Financial Resource Strain (CARDIA)    Difficulty of Paying Living Expenses: Not hard at all  Food Insecurity: No Food Insecurity (12/18/2022)   Hunger Vital Sign    Worried About Running Out of Food in the Last Year: Never true    Ran Out of Food in the Last Year: Never true  Transportation Needs: No Transportation Needs (12/18/2022)   PRAPARE - Administrator, Civil Service (Medical): No    Lack of Transportation (Non-Medical): No  Physical Activity: Inactive (12/18/2022)   Exercise Vital Sign    Days of Exercise per Week: 0 days    Minutes of Exercise per Session: 0 min  Stress: Stress Concern Present (12/18/2022)   [employer] of Occupational Health - Occupational Stress Questionnaire    Feeling of Stress : To some extent  Social  Connections: Moderately Integrated (12/18/2022)   Social Connection and Isolation Panel [NHANES]    Frequency of Communication with Friends and Family: More than three times a week    Frequency of Social Gatherings with Friends and Family: More than three times a week    Attends Religious Services: More than 4 times per year    Active Member of [employer] or Organizations: No    Attends Banker Meetings: Never  Marital Status: Married  Catering manager Violence: Not At Risk (12/18/2022)   Humiliation, Afraid, Rape, and Kick questionnaire    Fear of Current or Ex-Partner: No    Emotionally Abused: No    Physically Abused: No    Sexually Abused: No    Review of Systems  All other systems reviewed and are negative.       Objective    BP 126/81 (BP Location: Right Arm, Patient Position: Sitting, Cuff Size: Large)   Pulse 84   Wt 254 lb 6.4 oz (115.4 kg)   SpO2 93%   BMI 45.06 kg/m   Physical Exam Vitals and nursing note reviewed.  Constitutional:      General: She is not in acute distress.    Appearance: She is obese.  Cardiovascular:     Rate and Rhythm: Normal rate and regular rhythm.  Pulmonary:     Effort: Pulmonary effort is normal.     Breath sounds: Normal breath sounds.  Abdominal:     Palpations: Abdomen is soft.     Tenderness: There is no abdominal tenderness.  Neurological:     General: No focal deficit present.     Mental Status: She is alert and oriented to person, place, and time.         Assessment & Plan:   Encounter for weight management  Class 3 severe obesity due to excess calories without serious comorbidity with body mass index (BMI) of 45.0 to 49.9 in adult  Other orders -     Tirzepatide -Weight Management; Inject 5 mg into the skin once a week.  Dispense: 2 mL; Refill: 0     Return in about 4 weeks (around 09/20/2023) for weight management, follow up.   Arlo Lama, MD

## 2023-08-30 ENCOUNTER — Ambulatory Visit: Admitting: Physician Assistant

## 2023-08-30 ENCOUNTER — Other Ambulatory Visit: Payer: Self-pay | Admitting: Sports Medicine

## 2023-08-30 ENCOUNTER — Encounter: Payer: Self-pay | Admitting: Physician Assistant

## 2023-08-30 ENCOUNTER — Other Ambulatory Visit: Payer: Self-pay | Admitting: Physician Assistant

## 2023-08-30 ENCOUNTER — Other Ambulatory Visit: Payer: Self-pay | Admitting: Family Medicine

## 2023-08-30 DIAGNOSIS — M170 Bilateral primary osteoarthritis of knee: Secondary | ICD-10-CM

## 2023-08-30 NOTE — Progress Notes (Signed)
 Office Visit Note   Patient: Karen [name]           Date of Birth: 1970-11-14           MRN: [id number] Visit Date: 08/30/2023              Requested by: Abraham Abo, MD [address] [place],  [place] [address] PCP: Abraham Abo, MD  No chief complaint on file.     HPI: Patient has a history of osteoarthritis of bilateral knees had injections with Dr. Vaughn Georges ultrasound-guided and she follows with him for this.  She comes in today to be referred by her job accommodations  Assessment & Plan: Visit Diagnoses:  1. Primary osteoarthritis of both knees     Plan: Patient has osteoarthritis bilateral knees she does have work accommodations.  She gets injections as she is needed.  She is not currently a candidate for knee replacement surgery will update her current job restrictions and accommodations will continue for another year  Follow-Up Instructions: Return if symptoms worsen or fail to improve.   Ortho Exam  Patient is alert, oriented, no adenopathy, well-dressed, normal affect, normal respiratory effort.   Imaging: No results found. No images are attached to the encounter.  Labs: Lab Results  Component Value Date   HGBA1C 4.8 07/12/2022   HGBA1C 7.6 (H) 04/13/2013   HGBA1C 6.6 (H) 11/24/2012   CRP 26 (H) 07/29/2019   LABORGA Multiple bacterial morphotypes present, none 11/10/2012   LABORGA predominant. Suggest appropriate recollection if 11/10/2012   LABORGA clinically indicated. 11/10/2012     Lab Results  Component Value Date   ALBUMIN 3.9 07/12/2022   ALBUMIN 4.4 07/29/2019   ALBUMIN 4.1 06/25/2019    Lab Results  Component Value Date   MG 1.7 (L) 05/29/2013   MG 1.8 04/13/2013   Lab Results  Component Value Date   VD25OH 17.4 (L) 07/12/2022    No results found for: "PREALBUMIN"    Latest Ref Rng & Units 07/12/2022   11:47 AM 07/29/2019   11:11 AM 06/26/2019    4:21 AM  CBC EXTENDED  WBC 3.4 - 10.8 x10E3/uL 7.3  8.0  9.1    RBC 3.77 - 5.28 x10E6/uL 4.18  4.87  4.50   Hemoglobin 11.1 - 15.9 g/dL 56.4  33.2  95.1   HCT 34.0 - 46.6 % 36.7  40.8  39.9   Platelets 150 - 450 x10E3/uL 272  313  265   NEUT# 1.4 - 7.0 x10E3/uL 4.4  5.6    Lymph# 0.7 - 3.1 x10E3/uL 2.2  1.5       There is no height or weight on file to calculate BMI.  Orders:  No orders of the defined types were placed in this encounter.  No orders of the defined types were placed in this encounter.    Procedures: No procedures performed  Clinical Data: No additional findings.  ROS:  All other systems negative, except as noted in the HPI. Review of Systems  Objective: Vital Signs: There were no vitals taken for this visit.  Specialty Comments:  No specialty comments available.  PMFS History: Patient Active Problem List   Diagnosis Date Noted   Osteoarthritis of knees, bilateral 08/30/2023   Left carpal tunnel syndrome 02/15/2023   OSA (obstructive sleep apnea) 10/26/2019   Nocturnal hypoxemia due to obesity 10/26/2019   Depression 07/29/2019   Chronic anticoagulation 07/29/2019   Nocturnal dyspnea 07/29/2019   History of 2019 novel coronavirus  disease (COVID-19) 07/29/2019   Right leg pain 07/29/2019   Brain fog 07/29/2019   Acute pulmonary embolism (HCC) 06/26/2019   Incidental lung nodule 06/26/2019   Steatosis of liver 06/25/2018   Carpal tunnel syndrome of right wrist 02/10/2018   Essential hypertension 10/26/2016   History of hypertension 10/26/2016   Menorrhagia 02/22/2015   Morbid obesity (HCC) 11/10/2012   Past Medical History:  Diagnosis Date   Acute pulmonary embolism (HCC) 06/26/2019   Arthritis    Left knee   Carpal tunnel syndrome of right wrist 02/10/2018   Essential hypertension 10/26/2016   Family history of early CAD    Gallstone 06/25/2018   Hypertension    Incidental lung nodule 06/26/2019   Menorrhagia 02/22/2015   Morbid obesity (HCC) 11/10/2012   BMI 63    Pain in right hand 01/27/2018   PONV  (postoperative nausea and vomiting)    Right upper quadrant pain 06/25/2018   Steatosis of liver 06/25/2018    Family History  Problem Relation Age of Onset   Hyperlipidemia Father    Heart attack Father    Heart attack Other    Hypertension Other    Cancer Other    Stroke Other    Hypertension Mother    Parkinson's disease Mother     Past Surgical History:  Procedure Laterality Date   CARPAL TUNNEL RELEASE Right 04/04/2018   Procedure: CARPAL TUNNEL RELEASE;  Surgeon: Hazle Lites, MD;  Location: [facility];  Service: Orthopedics;  Laterality: Right;    CHOLECYSTECTOMY     gastic sleeve  2015   HYSTEROSCOPY WITH NOVASURE N/A 03/29/2015   Procedure: HYSTEROSCOPY WITH NOVASURE;  Surgeon: Lacretia Piccolo, MD;  Location: [facility];  Service: Gynecology;  Laterality: N/A;  to follow first case around 10:15am.  Requests one hour OR time.   INTRAUTERINE DEVICE INSERTION     X 2  Spontaneously extruded X 2.   2014/2015   TUBAL LIGATION  approximately 2000   as an interval procedure historically   Social History   Occupational History   Not on file  Tobacco Use   Smoking status: Former   Smokeless tobacco: Never  Vaping Use   Vaping status: Never Used  Substance and Sexual Activity   Alcohol use: Yes    Comment: occas    Drug use: No   Sexual activity: Yes    Partners: Male    Birth control/protection: Surgical    Comment: BTL

## 2023-09-06 ENCOUNTER — Other Ambulatory Visit: Payer: Self-pay | Admitting: Family Medicine

## 2023-09-06 ENCOUNTER — Telehealth: Payer: Self-pay | Admitting: Family Medicine

## 2023-09-06 MED ORDER — ZEPBOUND 2.5 MG/0.5ML ~~LOC~~ SOAJ: 2.5000 mg | SUBCUTANEOUS | 0 refills | Status: DC

## 2023-09-06 NOTE — Telephone Encounter (Signed)
 Routing to office

## 2023-09-06 NOTE — Telephone Encounter (Signed)
 Copied from CRM [id number]. Topic: Clinical - Prescription Issue >> Aug 29, 2023  2:30 PM Fredrica W wrote: Reason for CRM: Patient called states she spoke with pharmacy and the prescription they received for ZEPBOUND  5 MG/0.5ML injection vial needs to be changed to PEN. Thank You >> Sep 06, 2023  9:22 AM Juluis Ok wrote: Patient states that she has spoken with her pharmacy and was told that prescription has not been updated. She is requesting that the  ZEPBOUND  5 MG/0.5ML injection vial needs to be changed to PEN. She states that she needs this done as soon as possible, due to her not being able to get her medication.

## 2023-09-06 NOTE — Telephone Encounter (Signed)
 Medication sent to pharmacy

## 2023-09-09 NOTE — Telephone Encounter (Signed)
 Requested medications are due for refill today.  See note  Requested medications are on the active medications list.  yes  Last refill.   Future visit scheduled.   yes  Notes to clinic.  Pharmacy comment: Alternative Requested:PT IS EXPECTING  NEW RX FOR ZEPBOUND  5 MG.    Requested Prescriptions  Pending Prescriptions Disp Refills   ZEPBOUND  2.5 MG/0.5ML Pen [Pharmacy Med Name: ZEPBOUND  2.5 MG/0.5 ML PEN]  0    Sig: INJECT 2.5 MG SUBCUTANEOUSLY WEEKLY     Off-Protocol Failed - 09/09/2023 11:45 AM      Failed - Medication not assigned to a protocol, review manually.      Passed - Valid encounter within last 12 months    Recent Outpatient Visits           2 weeks ago Encounter for weight management   [facility], MD   3 months ago Encounter for weight management   [facility], MD   7 months ago Encounter for weight management   [facility], MD   8 months ago COVID-19 long hauler    [facility], MD   1 year ago Annual physical exam    [facility] at [facility], MD

## 2023-09-12 ENCOUNTER — Other Ambulatory Visit: Payer: Self-pay | Admitting: Family

## 2023-09-12 ENCOUNTER — Telehealth: Payer: Self-pay

## 2023-09-12 DIAGNOSIS — Z7689 Persons encountering health services in other specified circumstances: Secondary | ICD-10-CM

## 2023-09-12 MED ORDER — TIRZEPATIDE-WEIGHT MANAGEMENT 5 MG/0.5ML ~~LOC~~ SOAJ
5.0000 mg | SUBCUTANEOUS | 0 refills | Status: DC
Start: 2023-09-12 — End: 2023-12-06

## 2023-09-12 NOTE — Telephone Encounter (Signed)
 Copied from CRM [id number]. Topic: Clinical - Prescription Issue >> Sep 11, 2023  5:02 PM Sophia H wrote: Reason for CRM: Spoke with patient who states tirzepatide  (ZEPBOUND ) 2.5 MG/0.5ML Pen needs to be changed to 5.0 MG pen since her dosage changed. Please reach out to patient once this has been done, Ty! [phone]  [id number] - [place], [place] - [address]

## 2023-09-12 NOTE — Telephone Encounter (Signed)
Called patient and she is aware

## 2023-09-12 NOTE — Telephone Encounter (Signed)
 Complete

## 2023-09-17 ENCOUNTER — Telehealth: Payer: Self-pay | Admitting: Physician Assistant

## 2023-09-17 NOTE — Telephone Encounter (Signed)
 Pt states that she needs some changes made to the accomodation form that was completed on 08/30/23.

## 2023-09-24 DIAGNOSIS — F600 Paranoid personality disorder: Secondary | ICD-10-CM | POA: Diagnosis not present

## 2023-09-27 ENCOUNTER — Telehealth: Payer: Self-pay | Admitting: Neurology

## 2023-09-27 ENCOUNTER — Ambulatory Visit: Admitting: Family Medicine

## 2023-09-27 ENCOUNTER — Encounter: Payer: Self-pay | Admitting: Family Medicine

## 2023-09-27 ENCOUNTER — Telehealth: Payer: Self-pay | Admitting: Physician Assistant

## 2023-09-27 VITALS — BP 121/81 | HR 96 | Wt 254.9 lb

## 2023-09-27 DIAGNOSIS — R350 Frequency of micturition: Secondary | ICD-10-CM | POA: Diagnosis not present

## 2023-09-27 LAB — POCT URINALYSIS DIP (CLINITEK)
Bilirubin, UA: NEGATIVE
Glucose, UA: NEGATIVE mg/dL
Ketones, POC UA: NEGATIVE mg/dL
Nitrite, UA: NEGATIVE
POC PROTEIN,UA: NEGATIVE
Spec Grav, UA: 1.015 (ref 1.010–1.025)
Urobilinogen, UA: 0.2 U/dL
pH, UA: 6 (ref 5.0–8.0)

## 2023-09-27 MED ORDER — NITROFURANTOIN MONOHYD MACRO 100 MG PO CAPS: 100.0000 mg | ORAL_CAPSULE | Freq: Two times a day (BID) | ORAL | 0 refills | Status: AC

## 2023-09-27 NOTE — Telephone Encounter (Signed)
 Pt called to request MRI results . Pt states she took MRI a while back and never got results

## 2023-09-27 NOTE — Telephone Encounter (Signed)
 Patient states for her Accommodations form, needs to stated must be near a bathroom and keep leg propped up, and she states she has been working from home for 5 years, form needs to state work from home. Please advise if okay. Thank you!

## 2023-09-27 NOTE — Telephone Encounter (Signed)
 Form revised and faxed. Copy mailed to patient.

## 2023-09-30 ENCOUNTER — Encounter: Payer: Self-pay | Admitting: Family Medicine

## 2023-09-30 NOTE — Progress Notes (Signed)
 Established Patient Office Visit  Subjective    Patient ID: Shanielle JINNY Norse, female    DOB: [birth year]  Age: 53 y.o. MRN: [id number]  CC:  Chief Complaint  Patient presents with   Medical Management of Chronic Issues    MRI results    Urinary Tract Infection    HPI Elnora JINNY Norse presents with frequent urination. Patient reports that her sx are similar to previous times when she has been dx with UTI. She denies fever/chills.   Outpatient Encounter Medications as of 09/27/2023  Medication Sig   albuterol  (VENTOLIN  HFA) 108 (90 Base) MCG/ACT inhaler TAKE 2 PUFFS BY MOUTH EVERY 6 HOURS AS NEEDED FOR WHEEZE OR SHORTNESS OF BREATH   ALPRAZolam  (XANAX ) 1 MG tablet Take 1 tablet (1 mg total) by mouth as needed for anxiety (To take prior to MRI).   amLODipine  (NORVASC ) 10 MG tablet TAKE 1 TABLET BY MOUTH EVERY DAY   butalbital -acetaminophen -caffeine  (FIORICET) 50-325-40 MG tablet Take 50-235 tablets by mouth as needed.   Calcium  Carbonate-Vitamin D  (CALCIUM -VITAMIN D ) 600-125 MG-UNIT TABS Take 1 tablet by mouth daily.   ciprofloxacin  (CIPRO ) 500 MG tablet Take 1 tablet (500 mg total) by mouth 2 (two) times daily.   CREON 36000-114000 units CPEP capsule Take 1 capsule by mouth 3 (three) times daily with meals.   furosemide  (LASIX ) 40 MG tablet Take 1 tablet (40 mg total) by mouth once a week. Pt. Need to schedule an appt in order to receive future refills. Thank you. 2nd attempt.   gabapentin  (NEURONTIN ) 300 MG capsule START WITH 1 CAPSULE AT BEDTIME X 3 DAYS THEN 2 CAPS AT BEDTIME EVERY DAY   meclizine  (ANTIVERT ) 12.5 MG tablet Take 1 tablet (12.5 mg total) by mouth 3 (three) times daily as needed for dizziness.   meloxicam  (MOBIC ) 7.5 MG tablet TAKE 1 TABLET BY MOUTH EVERY DAY   mirtazapine  (REMERON ) 15 MG tablet TAKE 1 TABLET BY MOUTH EVERYDAY AT BEDTIME   nitrofurantoin , macrocrystal-monohydrate, (MACROBID ) 100 MG capsule Take 1 capsule (100 mg total) by mouth 2 (two) times daily.    ondansetron  (ZOFRAN ) 4 MG tablet Take 1 tablet (4 mg total) by mouth every 8 (eight) hours as needed for nausea or vomiting.   ondansetron  (ZOFRAN ) 4 MG tablet TAKE 1 TABLET BY MOUTH EVERY 8 HOURS AS NEEDED FOR NAUSEA AND VOMITING   potassium chloride  (KLOR-CON ) 10 MEQ tablet TAKE 1 TABLET EVERY MONDAY   scopolamine  (TRANSDERM-SCOP) 1 MG/3DAYS Place 1 patch (1.5 mg total) onto the skin every 3 (three) days.   sucralfate  (CARAFATE ) 1 g tablet Take 1 tablet (1 g total) by mouth 4 (four) times daily -  with meals and at bedtime.   tirzepatide  (ZEPBOUND ) 5 MG/0.5ML Pen Inject 5 mg into the skin once a week.   traMADol  (ULTRAM ) 50 MG tablet TAKE 1 TABLET (50 MG TOTAL) BY MOUTH EVERY 6 (SIX) HOURS AS NEEDED FOR UP TO 5 DAYS.   triamcinolone  cream (KENALOG ) 0.1 % APPLY 1 APPLICATION TOPICALLY TWICE A DAY AS NEEDED   Vitamin D , Ergocalciferol , (DRISDOL ) 1.25 MG (50000 UNIT) CAPS capsule Take 1 capsule (50,000 Units total) by mouth every 7 (seven) days.   ZEPBOUND  2.5 MG/0.5ML Pen INJECT 2.5 MG SUBCUTANEOUSLY WEEKLY   No facility-administered encounter medications on file as of 09/27/2023.    Past Medical History:  Diagnosis Date   Acute pulmonary embolism (HCC) 06/26/2019   Arthritis    Left knee   Carpal tunnel syndrome of right wrist 02/10/2018  Essential hypertension 10/26/2016   Family history of early CAD    Gallstone 06/25/2018   Hypertension    Incidental lung nodule 06/26/2019   Menorrhagia 02/22/2015   Morbid obesity (HCC) 11/10/2012   BMI 63    Pain in right hand 01/27/2018   PONV (postoperative nausea and vomiting)    Right upper quadrant pain 06/25/2018   Steatosis of liver 06/25/2018    Past Surgical History:  Procedure Laterality Date   CARPAL TUNNEL RELEASE Right 04/04/2018   Procedure: CARPAL TUNNEL RELEASE;  Surgeon: Heide Ingle, MD;  Location: [facility];  Service: Orthopedics;  Laterality: Right;    CHOLECYSTECTOMY     gastic sleeve  2015   HYSTEROSCOPY WITH NOVASURE  N/A 03/29/2015   Procedure: HYSTEROSCOPY WITH NOVASURE;  Surgeon: Evalene SHAUNNA Organ, MD;  Location: [facility];  Service: Gynecology;  Laterality: N/A;  to follow first case around 10:15am.  Requests one hour OR time.   INTRAUTERINE DEVICE INSERTION     X 2  Spontaneously extruded X 2.   2014/2015   TUBAL LIGATION  approximately 2000   as an interval procedure historically    Family History  Problem Relation Age of Onset   Hyperlipidemia Father    Heart attack Father    Heart attack Other    Hypertension Other    Cancer Other    Stroke Other    Hypertension Mother    Parkinson's disease Mother     Social History   Socioeconomic History   Marital status: Married    Spouse name: Not on file   Number of children: Not on file   Years of education: Not on file   Highest education level: Not on file  Occupational History   Not on file  Tobacco Use   Smoking status: Former   Smokeless tobacco: Never  Vaping Use   Vaping status: Never Used  Substance and Sexual Activity   Alcohol use: Yes    Comment: occas    Drug use: No   Sexual activity: Yes    Partners: Male    Birth control/protection: Surgical    Comment: BTL  Other Topics Concern   Not on file  Social History Narrative   Right handed   Lives with friend, two story home   Social Drivers of Health   Financial Resource Strain: Low Risk  (12/18/2022)   Overall Financial Resource Strain (CARDIA)    Difficulty of Paying Living Expenses: Not hard at all  Food Insecurity: No Food Insecurity (12/18/2022)   Hunger Vital Sign    Worried About Running Out of Food in the Last Year: Never true    Ran Out of Food in the Last Year: Never true  Transportation Needs: No Transportation Needs (12/18/2022)   PRAPARE - Administrator, Civil Service (Medical): No    Lack of Transportation (Non-Medical): No  Physical Activity: Inactive (12/18/2022)   Exercise Vital Sign    Days of Exercise per Week: 0 days    Minutes of  Exercise per Session: 0 min  Stress: Stress Concern Present (12/18/2022)   [employer] of Occupational Health - Occupational Stress Questionnaire    Feeling of Stress : To some extent  Social Connections: Moderately Integrated (12/18/2022)   Social Connection and Isolation Panel    Frequency of Communication with Friends and Family: More than three times a week    Frequency of Social Gatherings with Friends and Family: More than three times a week  Attends Religious Services: More than 4 times per year    Active Member of Clubs or Organizations: No    Attends Banker Meetings: Never    Marital Status: Married  Catering manager Violence: Not At Risk (12/18/2022)   Humiliation, Afraid, Rape, and Kick questionnaire    Fear of Current or Ex-Partner: No    Emotionally Abused: No    Physically Abused: No    Sexually Abused: No    Review of Systems  Genitourinary:  Positive for frequency.  All other systems reviewed and are negative.       Objective    BP 121/81 (BP Location: Right Arm, Patient Position: Sitting)   Pulse 96   Wt 254 lb 13.6 oz (115.6 kg)   SpO2 93%   BMI 45.14 kg/m   Physical Exam Vitals and nursing note reviewed.  Constitutional:      General: She is not in acute distress.    Appearance: She is obese.   Cardiovascular:     Rate and Rhythm: Normal rate and regular rhythm.  Pulmonary:     Effort: Pulmonary effort is normal.     Breath sounds: Normal breath sounds.  Abdominal:     Palpations: Abdomen is soft.     Tenderness: There is no abdominal tenderness.   Neurological:     General: No focal deficit present.     Mental Status: She is alert and oriented to person, place, and time.         Assessment & Plan:   Frequent urination -     POCT URINALYSIS DIP (CLINITEK)  Other orders -     Nitrofurantoin  Monohyd Macro; Take 1 capsule (100 mg total) by mouth 2 (two) times daily.  Dispense: 10 capsule; Refill: 0   Consider  referral to urology for further eval/mgt for recurrent UTI.   Return in about 6 weeks (around 11/08/2023) for follow up, weight management.    Tanda Raguel SQUIBB, MD

## 2023-10-01 ENCOUNTER — Other Ambulatory Visit: Payer: Self-pay

## 2023-10-16 ENCOUNTER — Telehealth: Payer: Self-pay | Admitting: Physician Assistant

## 2023-10-16 NOTE — Telephone Encounter (Signed)
 Received call from patient wanting to make changes to the accommodations form that was completed originally 09/04/23. I discussed with Ronal Dragon and she stated that we cannot make anymore changes to form. Patient needs to make appointment with Dr. Burnetta for full evaluation as he has been giving the ultrasound-guided injections for bil knee,. Dr. Burnetta will make any determination regarding any need for accommodations. I explained all of this to patient and she expressed understanding.

## 2023-11-08 ENCOUNTER — Other Ambulatory Visit: Payer: Self-pay

## 2023-11-08 ENCOUNTER — Encounter: Payer: Self-pay | Admitting: Sports Medicine

## 2023-11-08 ENCOUNTER — Ambulatory Visit: Admitting: Family Medicine

## 2023-11-08 ENCOUNTER — Ambulatory Visit (INDEPENDENT_AMBULATORY_CARE_PROVIDER_SITE_OTHER): Admitting: Sports Medicine

## 2023-11-08 DIAGNOSIS — E66813 Obesity, class 3: Secondary | ICD-10-CM | POA: Diagnosis not present

## 2023-11-08 DIAGNOSIS — M170 Bilateral primary osteoarthritis of knee: Secondary | ICD-10-CM | POA: Diagnosis not present

## 2023-11-08 DIAGNOSIS — M25562 Pain in left knee: Secondary | ICD-10-CM | POA: Diagnosis not present

## 2023-11-08 DIAGNOSIS — Z6841 Body Mass Index (BMI) 40.0 and over, adult: Secondary | ICD-10-CM

## 2023-11-08 DIAGNOSIS — G8929 Other chronic pain: Secondary | ICD-10-CM

## 2023-11-08 DIAGNOSIS — M25561 Pain in right knee: Secondary | ICD-10-CM

## 2023-11-08 MED ORDER — MELOXICAM 7.5 MG PO TABS: 7.5000 mg | ORAL_TABLET | Freq: Every day | ORAL | 0 refills | Status: DC | PRN

## 2023-11-08 MED ORDER — BUPIVACAINE HCL 0.25 % IJ SOLN
2.0000 mL | INTRAMUSCULAR | Status: AC | PRN
  Administered 2023-11-08: 2 mL via INTRA_ARTICULAR

## 2023-11-08 MED ORDER — METHYLPREDNISOLONE ACETATE 40 MG/ML IJ SUSP
40.0000 mg | INTRAMUSCULAR | Status: AC | PRN
  Administered 2023-11-08: 40 mg via INTRA_ARTICULAR

## 2023-11-08 MED ORDER — LIDOCAINE HCL 1 % IJ SOLN
2.0000 mL | INTRAMUSCULAR | Status: AC | PRN
  Administered 2023-11-08: 2 mL

## 2023-11-08 MED ORDER — LIDOCAINE HCL 1 % IJ SOLN
2.0000 mL | INTRAMUSCULAR | Status: AC | PRN
Start: 2023-11-08 — End: 2023-11-08
  Administered 2023-11-08: 2 mL

## 2023-11-08 MED ORDER — METHYLPREDNISOLONE ACETATE 40 MG/ML IJ SUSP
40.0000 mg | INTRAMUSCULAR | Status: AC | PRN
Start: 2023-11-08 — End: 2023-11-08
  Administered 2023-11-08: 40 mg via INTRA_ARTICULAR

## 2023-11-08 NOTE — Progress Notes (Signed)
 Karen [name] - 53 y.o. female MRN [id number]  Date of birth: 07-05-1970  Office Visit Note: Visit Date: 11/08/2023 PCP: Tanda Bleacher, MD Referred by: Tanda Bleacher, MD  Subjective: Chief Complaint  Patient presents with   Right Knee - Pain   Left Knee - Pain   HPI: Karen [name] is a pleasant 53 y.o. female who presents today for follow-up of bilateral knee pain with known osteoarthritis.  Back in March of this year we did proceed with ultrasound-guided intra-articular knee injections which gave her good relief through June and then her pain started returning. The left knee is slightly worse than the right knee.  She has been relying on her cane the past 2 months.  She is working hard on weight loss and has lost weight over the last few months.  She is managed on Zepbound  5 mg IM weekly.  She is hopeful to get to a point where she is able to undergo knee replacement.  She does take meloxicam  7.5 mg daily as needed although is out of this recently.  Pertinent ROS were reviewed with the patient and found to be negative unless otherwise specified above in HPI.   Assessment & Plan: Visit Diagnoses:  1. Primary osteoarthritis of both knees   2. Chronic pain of both knees   3. Class 3 severe obesity without serious comorbidity with body mass index (BMI) of 45.0 to 49.9 in adult, unspecified obesity type    Plan: Impression is chronic bilateral knee pain with severe osteoarthritis and recent exacerbation.  She has received good relief for at least 3 months at a time from previous ultrasound-guided injections, her last were back in March.  Through shared decision making, we did proceed with ultrasound-guided right and left knee corticosteroid injection, patient tolerated well.  Advised on postinjection protocol.  May use ice/heat or Tylenol  for postinjection pain.  She does use meloxicam  7.5 mg infrequently only for her bad days, I did refill this for her to use for breakthrough pain  as needed.  Recommend she not use this consistently given her history of gastric sleeve.  She is working hard on reducing her weight and has been successful with with this with lifestyle intervention and Zepbound , she will continue Zepbound  5 mg IM weekly.  She is working towards reducing her weight for her overall health but also with hopes of knee replacement.  I did calculate her BMI and she would need to get down to a weight of approximately 218 lbs to be a candidate for knee replacement with a BMI around 40 or less.  Follow-up: Return in about 3 months as needed Meds & Orders:  Meds ordered this encounter  Medications   meloxicam  (MOBIC ) 7.5 MG tablet    Sig: Take 1 tablet (7.5 mg total) by mouth daily as needed for pain.    Dispense:  30 tablet    Refill:  0    Orders Placed This Encounter  Procedures   Large Joint Inj: R knee   Large Joint Inj: L knee   US  Guided Needle Placement - No Linked Charges     Procedures: Large Joint Inj: R knee on 11/08/2023 1:33 PM Indications: pain and joint swelling Details: 22 G 1.5 in needle, ultrasound-guided superolateral approach Medications: 2 mL lidocaine  1 %; 2 mL bupivacaine  0.25 %; 40 mg methylPREDNISolone  acetate 40 MG/ML Outcome: tolerated well, no immediate complications  Procedure: Ultrasound-guided Knee Injection, Right After discussion on risk/benefits/indication, informed verbal consent was obtained.  A timeout was then performed. The patient was lying in supine on examination table with a small bolster underneath the affected knee for comfort. The patient's knee was prepped with chloraprep and alcohol swabs. Utilizing ultrasound-guidance with the probe in a transverse position, the patient's suprapatellar bursa was identified and the knee joint was subsequently injected intraarticularly with a mixture of 2:2:1 lidocaine :bupivicaine:depomedrol utilizing an in-plane visualization approach. Patient tolerated the procedure well without  immediate complications.  Procedure, treatment alternatives, risks and benefits explained, specific risks discussed. Consent was given by the patient. Patient was prepped and draped in the usual sterile fashion.    Large Joint Inj: L knee on 11/08/2023 1:34 PM Indications: pain and joint swelling Details: 22 G 1.5 in needle, ultrasound-guided superolateral approach Medications: 2 mL lidocaine  1 %; 2 mL bupivacaine  0.25 %; 40 mg methylPREDNISolone  acetate 40 MG/ML Outcome: tolerated well, no immediate complications  Procedure: Ultrasound-guided Knee Injection, Left After discussion on risk/benefits/indication, informed verbal consent was obtained. A timeout was then performed. The patient was lying in supine on examination table with a small bolster underneath the affected knee for comfort. The patient's knee was prepped with chloraprep and alcohol swabs. Utilizing ultrasound-guidance with the probe in a transverse position, the patient's suprapatellar bursa was identified and the knee joint was subsequently injected intraarticularly with a mixture of 2:2:1 lidocaine :bupivicaine:depomedrol utilizing an in-plane visualization approach. Patient tolerated the procedure well without immediate complications. Procedure, treatment alternatives, risks and benefits explained, specific risks discussed. Consent was given by the patient. Patient was prepped and draped in the usual sterile fashion.          Clinical History: No specialty comments available.  She reports that she has quit smoking. She has never used smokeless tobacco. No results for input(s): HGBA1C, LABURIC in the last 8760 hours.  Wt 254 lb 13.6 oz (115.6 kg)  BMI 45.14 kg/m  *From PCP visit (09/27/23)  Objective:    Physical Exam  Gen: Well-appearing, in no acute distress; non-toxic CV: Well-perfused. Warm.  Resp: Breathing unlabored on room air; no wheezing. Psych: Fluid speech in conversation; appropriate affect; normal  thought process  Ortho Exam - Bilateral knee: There is trace to small effusion on bilateral knees without significant warmth or redness.  Patient does walk with an antalgic gait requiring the use of a cane.  There is a rather significant degree of varus collapse.  Positive TTP over the medial joint line and patellofemoral region.  Patellofemoral crepitus noted.  Imaging:  06/12/23: Three-view x-rays of her right knee were taken today.  She has advanced  tricompartmental arthritis with varus alignment.  Almost bone-on-bone in  the medial compartment bone-on-bone patellofemoral compartment no acute  changes   06/12/23: 2 view radiographs of her left knee were obtained today.  She has varus  alignment with bone-on-bone degenerative changes and periarticular  osteophytes especially the of the medial compartment but also the  patellofemoral joint and lateral compartment.  No acute changes 2 view radiographs of her left knee were obtained today.  She has varus  alignment with bone-on-bone degenerative changes and periarticular  osteophytes especially the of the medial compartment but also the  patellofemoral joint and lateral compartment.  No acute changes   Past Medical/Family/Surgical/Social History: Medications & Allergies reviewed per EMR, new medications updated. Patient Active Problem List   Diagnosis Date Noted   Osteoarthritis of knees, bilateral 08/30/2023   Left carpal tunnel syndrome 02/15/2023   OSA (obstructive sleep apnea) 10/26/2019   Nocturnal hypoxemia due  to obesity 10/26/2019   Depression 07/29/2019   Chronic anticoagulation 07/29/2019   Nocturnal dyspnea 07/29/2019   History of 2019 novel coronavirus disease (COVID-19) 07/29/2019   Right leg pain 07/29/2019   Brain fog 07/29/2019   Acute pulmonary embolism (HCC) 06/26/2019   Incidental lung nodule 06/26/2019   Steatosis of liver 06/25/2018   Carpal tunnel syndrome of right wrist 02/10/2018   Essential hypertension  10/26/2016   History of hypertension 10/26/2016   Menorrhagia 02/22/2015   Morbid obesity (HCC) 11/10/2012   Past Medical History:  Diagnosis Date   Acute pulmonary embolism (HCC) 06/26/2019   Arthritis    Left knee   Carpal tunnel syndrome of right wrist 02/10/2018   Essential hypertension 10/26/2016   Family history of early CAD    Gallstone 06/25/2018   Hypertension    Incidental lung nodule 06/26/2019   Menorrhagia 02/22/2015   Morbid obesity (HCC) 11/10/2012   BMI 63    Pain in right hand 01/27/2018   PONV (postoperative nausea and vomiting)    Right upper quadrant pain 06/25/2018   Steatosis of liver 06/25/2018   Family History  Problem Relation Age of Onset   Hyperlipidemia Father    Heart attack Father    Heart attack Other    Hypertension Other    Cancer Other    Stroke Other    Hypertension Mother    Parkinson's disease Mother    Past Surgical History:  Procedure Laterality Date   CARPAL TUNNEL RELEASE Right 04/04/2018   Procedure: CARPAL TUNNEL RELEASE;  Surgeon: Heide Ingle, MD;  Location: [facility];  Service: Orthopedics;  Laterality: Right;    CHOLECYSTECTOMY     gastic sleeve  2015   HYSTEROSCOPY WITH NOVASURE N/A 03/29/2015   Procedure: HYSTEROSCOPY WITH NOVASURE;  Surgeon: Evalene SHAUNNA Organ, MD;  Location: [facility];  Service: Gynecology;  Laterality: N/A;  to follow first case around 10:15am.  Requests one hour OR time.   INTRAUTERINE DEVICE INSERTION     X 2  Spontaneously extruded X 2.   2014/2015   TUBAL LIGATION  approximately 2000   as an interval procedure historically   Social History   Occupational History   Not on file  Tobacco Use   Smoking status: Former   Smokeless tobacco: Never  Vaping Use   Vaping status: Never Used  Substance and Sexual Activity   Alcohol use: Yes    Comment: occas    Drug use: No   Sexual activity: Yes    Partners: Male    Birth control/protection: Surgical    Comment: BTL

## 2023-11-08 NOTE — Progress Notes (Signed)
 Patient says that the knee injections in March were helpful. Her pain began to flare up again in June or July, and feels the same as it did prior to the injections. She denies any new injuries. She is taking Tylenol  consistently and says that it does seem to help her pain.

## 2023-12-05 ENCOUNTER — Other Ambulatory Visit: Payer: Self-pay | Admitting: Family Medicine

## 2023-12-05 DIAGNOSIS — Z7689 Persons encountering health services in other specified circumstances: Secondary | ICD-10-CM

## 2023-12-05 NOTE — Telephone Encounter (Unsigned)
 Copied from CRM [id number]. Topic: Clinical - Medication Refill >> Dec 05, 2023  2:01 PM Yolanda T wrote: Medication: ZEPBOUND  2.5 MG/0.5ML Pen (patient said she is on the 5mg )  Has the patient contacted their pharmacy? No This is the patient's preferred pharmacy:  [facility] [address], [place] - [address] [place] [address] Phone: [phone] Fax: [phone]  Is this the correct pharmacy for this prescription? Yes  Has the prescription been filled recently? Yes  Is the patient out of the medication? Yes  Has the patient been seen for an appointment in the last year OR does the patient have an upcoming appointment? Yes  Can we respond through MyChart? Yes  Agent: Please be advised that Rx refills may take up to 3 business days. We ask that you follow-up with your pharmacy.

## 2023-12-06 MED ORDER — TIRZEPATIDE-WEIGHT MANAGEMENT 5 MG/0.5ML ~~LOC~~ SOAJ: 5.0000 mg | SUBCUTANEOUS | 0 refills | Status: AC

## 2023-12-06 NOTE — Telephone Encounter (Signed)
 Requested medication (s) are due for refill today - yes  Requested medication (s) are on the active medication list -yes  Future visit scheduled - yes  Last refill: 09/12/23 3ml  Notes to clinic: off protocol- provider review   Requested Prescriptions  Pending Prescriptions Disp Refills   tirzepatide  (ZEPBOUND ) 5 MG/0.5ML Pen 3 mL 0    Sig: Inject 5 mg into the skin once a week.     Off-Protocol Failed - 12/06/2023  4:11 PM      Failed - Medication not assigned to a protocol, review manually.      Passed - Valid encounter within last 12 months    Recent Outpatient Visits           2 months ago Frequent urination   [facility] [facility], MD   3 months ago Encounter for weight management   [place] [facility], MD   6 months ago Encounter for weight management   [place] [facility], MD   10 months ago Encounter for weight management   [place] [facility], MD   11 months ago COVID-19 long hauler   [place] [facility], MD                 Requested Prescriptions  Pending Prescriptions Disp Refills   tirzepatide  (ZEPBOUND ) 5 MG/0.5ML Pen 3 mL 0    Sig: Inject 5 mg into the skin once a week.     Off-Protocol Failed - 12/06/2023  4:11 PM      Failed - Medication not assigned to a protocol, review manually.      Passed - Valid encounter within last 12 months    Recent Outpatient Visits           2 months ago Frequent urination   [place] [facility], MD   3 months ago Encounter for weight management   [place] [facility], MD   6 months ago Encounter for weight management   [place] [facility], MD   10 months ago Encounter for weight management   [place] [facility], MD   11 months ago COVID-19 long [facility], MD

## 2023-12-12 ENCOUNTER — Telehealth: Payer: Self-pay

## 2023-12-12 ENCOUNTER — Other Ambulatory Visit: Payer: Self-pay | Admitting: Family Medicine

## 2023-12-12 MED ORDER — WEGOVY 0.25 MG/0.5ML ~~LOC~~ SOAJ: 0.2500 mg | SUBCUTANEOUS | 0 refills | Status: DC

## 2023-12-12 NOTE — Telephone Encounter (Signed)
 Copied from CRM [id number]. Topic: Clinical - Prescription Issue >> Dec 12, 2023  9:31 AM Avram MATSU wrote: Reason for CRM: patient stated her insurance does not cover ZEPBOUND  2.5 MG/0.5ML Pen [[id number] anymore and was sent of list of recommendations. She stated she would like a rx sent for wegovy .

## 2024-01-03 ENCOUNTER — Ambulatory Visit: Admitting: Family Medicine

## 2024-01-10 ENCOUNTER — Other Ambulatory Visit: Payer: Self-pay | Admitting: Family Medicine

## 2024-01-14 ENCOUNTER — Other Ambulatory Visit: Payer: Self-pay | Admitting: Family Medicine

## 2024-01-24 ENCOUNTER — Ambulatory Visit

## 2024-02-10 ENCOUNTER — Encounter: Payer: Self-pay | Admitting: Radiology

## 2024-03-20 ENCOUNTER — Ambulatory Visit: Admitting: Family Medicine

## 2024-03-30 ENCOUNTER — Ambulatory Visit: Admitting: Sports Medicine

## 2024-03-31 ENCOUNTER — Ambulatory Visit: Admitting: Sports Medicine

## 2024-03-31 ENCOUNTER — Other Ambulatory Visit: Payer: Self-pay

## 2024-03-31 ENCOUNTER — Encounter: Payer: Self-pay | Admitting: Sports Medicine

## 2024-03-31 DIAGNOSIS — G8929 Other chronic pain: Secondary | ICD-10-CM

## 2024-03-31 DIAGNOSIS — E66813 Obesity, class 3: Secondary | ICD-10-CM | POA: Diagnosis not present

## 2024-03-31 DIAGNOSIS — M25562 Pain in left knee: Secondary | ICD-10-CM | POA: Diagnosis not present

## 2024-03-31 DIAGNOSIS — M25561 Pain in right knee: Secondary | ICD-10-CM

## 2024-03-31 DIAGNOSIS — M170 Bilateral primary osteoarthritis of knee: Secondary | ICD-10-CM | POA: Diagnosis not present

## 2024-03-31 DIAGNOSIS — Z6841 Body Mass Index (BMI) 40.0 and over, adult: Secondary | ICD-10-CM

## 2024-03-31 MED ORDER — BUPIVACAINE HCL 0.25 % IJ SOLN
2.0000 mL | INTRAMUSCULAR | Status: AC | PRN
  Administered 2024-03-31: 2 mL via INTRA_ARTICULAR

## 2024-03-31 MED ORDER — METHYLPREDNISOLONE ACETATE 40 MG/ML IJ SUSP
40.0000 mg | INTRAMUSCULAR | Status: AC | PRN
  Administered 2024-03-31: 40 mg via INTRA_ARTICULAR

## 2024-03-31 MED ORDER — LIDOCAINE HCL 1 % IJ SOLN
2.0000 mL | INTRAMUSCULAR | Status: AC | PRN
  Administered 2024-03-31: 2 mL

## 2024-03-31 NOTE — Progress Notes (Signed)
 Patient says that her pain returned at the end of November. She says that throughout that time, she felt about 80% improvement in both knees. She is here for repeat injections today.

## 2024-03-31 NOTE — Progress Notes (Signed)
 "  Karen [name] - 53 y.o. female MRN [id number]  Date of birth: [birth year]  Office Visit Note: Visit Date: 03/31/2024 PCP: Tanda Bleacher, MD Referred by: Tanda Bleacher, MD  Subjective: Chief Complaint  Patient presents with   Right Knee - Pain   Left Knee - Pain   HPI: Karen [name] is a pleasant 53 y.o. female who presents today for acute on chronic bilateral knee pain.  Has known advanced osteoarthritis of each knee.  She is working hard on losing weight and has been successful with this.  She is managed on Zepbound  5 mg IM weekly.  Most recent weight was 243 pounds, this is approaching her weight goal of BMI of 40 or less.  We did perform ultrasound-guided intra-articular knee injections back on 11/08/2023 which gave her good relief for a few months, although nearing end of November her pain slowly started returning.  She is using meloxicam  7.5 mg daily as needed. Also uses Tylenol  PRN.  Lab Results  Component Value Date   HGBA1C 4.8 07/12/2022   Pertinent ROS were reviewed with the patient and found to be negative unless otherwise specified above in HPI.   Assessment & Plan: Visit Diagnoses:  1. Primary osteoarthritis of both knees   2. Chronic pain of both knees   3. Class 3 severe obesity without serious comorbidity with body mass index (BMI) of 40.0 to 44.9 in adult, unspecified obesity type (HCC)    Plan: Impression is chronic bilateral knee pain with severe osteoarthritis which have become exacerbated bilaterally.  She has received months of relief from previous corticosteroid injections and is in her state in repeating this.  Through shared decision making, did proceed with ultrasound-guided right and left knee corticosteroid injection, patient tolerated well.  Advised on postinjection protocol.  She will continue working on her healthy weight loss and has been moving towards her goal of a BMI of less than 40.  She will continue her Zepbound  5 mg IM weekly as well as  healthy lifestyle interventions.  When she is close to a BMI of around 40 or less, we will get her to see Dr. Jerri or Dr. Vernetta to discuss total knee replacement.  This would be around a weight of 220 pounds.  She will use meloxicam  7.5 mg daily as needed for her bad days, refill provided today.  I will see her back in about 3 months to reevaluate, consider infrequent injections as needed.  Did discuss the role for Zilretta , long-acting steroid injection.  If we were to pursue this, would need to wait 6 months from TKA, so we will hold on this for now.  Follow-up: Return in about 3 months (around 06/29/2024) for b/l knee FU and consideration US -guided b/l knee injs (30-mins).   Meds & Orders: No orders of the defined types were placed in this encounter.   Orders Placed This Encounter  Procedures   Large Joint Inj: R knee   Large Joint Inj: L knee   US  Guided Needle Placement - No Linked Charges     Procedures: Large Joint Inj: R knee on 03/31/2024 10:58 AM Indications: pain Details: 22 G 1.5 in needle, superolateral approach Medications: 2 mL lidocaine  1 %; 2 mL bupivacaine  0.25 %; 40 mg methylPREDNISolone  acetate 40 MG/ML Outcome: tolerated well, no immediate complications  Procedure: Ultrasound-guided Knee Injection, Right After discussion on risk/benefits/indication, informed verbal consent was obtained. A timeout was then performed. The patient was lying in supine on examination table with  a small bolster underneath the affected knee for comfort. The patient's knee was prepped with chloraprep and alcohol swabs. Utilizing ultrasound-guidance with the probe in a transverse position, the patient's suprapatellar bursa was identified and the knee joint was subsequently injected intraarticularly with a mixture of 2:2:1 lidocaine :bupivicaine:depomedrol utilizing an in-plane visualization approach. Patient tolerated the procedure well without immediate complications.  Procedure, treatment  alternatives, risks and benefits explained, specific risks discussed. Consent was given by the patient. Patient was prepped and draped in the usual sterile fashion.    Large Joint Inj: L knee on 03/31/2024 10:58 AM Details: 22 G 1.5 in needle, anterolateral approach Medications: 2 mL lidocaine  1 %; 2 mL bupivacaine  0.25 %; 40 mg methylPREDNISolone  acetate 40 MG/ML Outcome: tolerated well, no immediate complications  Procedure: Ultrasound-guided Knee Injection, Left After discussion on risk/benefits/indication, informed verbal consent was obtained. A timeout was then performed. The patient was lying in supine on examination table with a small bolster underneath the affected knee for comfort. The patient's knee was prepped with chloraprep and alcohol swabs. Utilizing ultrasound-guidance with the probe in a transverse position, the patient's suprapatellar bursa was identified and the knee joint was subsequently injected intraarticularly with a mixture of 2:2:1 lidocaine :bupivicaine:depomedrol utilizing an in-plane visualization approach. Patient tolerated the procedure well without immediate complications.  Procedure, treatment alternatives, risks and benefits explained, specific risks discussed. Consent was given by the patient. Patient was prepped and draped in the usual sterile fashion.          Clinical History:   - Height: 5'2 - Weight: 243 lbs - BMI: 44  *Measured above during today's visit  Objective:    Physical Exam  Gen: Well-appearing, in no acute distress; non-toxic CV: Well-perfused. Warm.  Resp: Breathing unlabored on room air; no wheezing. Psych: Fluid speech in conversation; appropriate affect; normal thought process  Ortho Exam - Bilateral knees: Trace effusion of bilateral knees without significant warmth or redness.  Patient ambulates with the assistance of a cane.  There is a quite notable varus collapse upon standing.  There is patellofemoral crepitus noted  bilaterally.  Imaging:  06/12/22: 2 view radiographs of her left knee were obtained today.  She has varus  alignment with bone-on-bone degenerative changes and periarticular  osteophytes especially the of the medial compartment but also the  patellofemoral joint and lateral compartment.  No acute changes   06/12/22: Three-view x-rays of her right knee were taken today.  She has advanced  tricompartmental arthritis with varus alignment.  Almost bone-on-bone in  the medial compartment bone-on-bone patellofemoral compartment no acute  changes   Past Medical/Family/Surgical/Social History: Medications & Allergies reviewed per EMR, new medications updated. Patient Active Problem List   Diagnosis Date Noted   Osteoarthritis of knees, bilateral 08/30/2023   Left carpal tunnel syndrome 02/15/2023   OSA (obstructive sleep apnea) 10/26/2019   Nocturnal hypoxemia due to obesity 10/26/2019   Depression 07/29/2019   Chronic anticoagulation 07/29/2019   Nocturnal dyspnea 07/29/2019   History of 2019 novel coronavirus disease (COVID-19) 07/29/2019   Right leg pain 07/29/2019   Brain fog 07/29/2019   Acute pulmonary embolism (HCC) 06/26/2019   Incidental lung nodule 06/26/2019   Steatosis of liver 06/25/2018   Carpal tunnel syndrome of right wrist 02/10/2018   Essential hypertension 10/26/2016   History of hypertension 10/26/2016   Menorrhagia 02/22/2015   Morbid obesity (HCC) 11/10/2012   Past Medical History:  Diagnosis Date   Acute pulmonary embolism (HCC) 06/26/2019   Arthritis  Left knee   Carpal tunnel syndrome of right wrist 02/10/2018   Essential hypertension 10/26/2016   Family history of early CAD    Gallstone 06/25/2018   Hypertension    Incidental lung nodule 06/26/2019   Menorrhagia 02/22/2015   Morbid obesity (HCC) 11/10/2012   BMI 63    Pain in right hand 01/27/2018   PONV (postoperative nausea and vomiting)    Right upper quadrant pain 06/25/2018   Steatosis of liver  06/25/2018   Family History  Problem Relation Age of Onset   Hyperlipidemia Father    Heart attack Father    Heart attack Other    Hypertension Other    Cancer Other    Stroke Other    Hypertension Mother    Parkinson's disease Mother    Past Surgical History:  Procedure Laterality Date   CARPAL TUNNEL RELEASE Right 04/04/2018   Procedure: CARPAL TUNNEL RELEASE;  Surgeon: Heide Ingle, MD;  Location: [facility];  Service: Orthopedics;  Laterality: Right;    CHOLECYSTECTOMY     gastic sleeve  2015   HYSTEROSCOPY WITH NOVASURE N/A 03/29/2015   Procedure: HYSTEROSCOPY WITH NOVASURE;  Surgeon: Evalene SHAUNNA Organ, MD;  Location: [facility];  Service: Gynecology;  Laterality: N/A;  to follow first case around 10:15am.  Requests one hour OR time.   INTRAUTERINE DEVICE INSERTION     X 2  Spontaneously extruded X 2.   2014/2015   TUBAL LIGATION  approximately 2000   as an interval procedure historically   Social History   Occupational History   Not on file  Tobacco Use   Smoking status: Former   Smokeless tobacco: Never  Vaping Use   Vaping status: Never Used  Substance and Sexual Activity   Alcohol use: Yes    Comment: occas    Drug use: No   Sexual activity: Yes    Partners: Male    Birth control/protection: Surgical    Comment: BTL   "

## 2024-04-03 ENCOUNTER — Encounter: Payer: Self-pay | Admitting: Family Medicine

## 2024-04-03 ENCOUNTER — Ambulatory Visit: Admitting: Family Medicine

## 2024-04-03 VITALS — BP 128/78 | HR 85 | Temp 98.4°F | Resp 16 | Ht 63.0 in | Wt 261.8 lb

## 2024-04-03 DIAGNOSIS — E66813 Obesity, class 3: Secondary | ICD-10-CM

## 2024-04-03 DIAGNOSIS — L209 Atopic dermatitis, unspecified: Secondary | ICD-10-CM | POA: Diagnosis not present

## 2024-04-03 DIAGNOSIS — I10 Essential (primary) hypertension: Secondary | ICD-10-CM | POA: Diagnosis not present

## 2024-04-03 DIAGNOSIS — Z6841 Body Mass Index (BMI) 40.0 and over, adult: Secondary | ICD-10-CM | POA: Diagnosis not present

## 2024-04-03 DIAGNOSIS — Z7985 Long-term (current) use of injectable non-insulin antidiabetic drugs: Secondary | ICD-10-CM | POA: Diagnosis not present

## 2024-04-03 DIAGNOSIS — Z7689 Persons encountering health services in other specified circumstances: Secondary | ICD-10-CM

## 2024-04-03 MED ORDER — TRIAMCINOLONE ACETONIDE 0.1 % EX CREA: TOPICAL_CREAM | Freq: Two times a day (BID) | CUTANEOUS | 0 refills | Status: AC

## 2024-04-03 MED ORDER — AMLODIPINE BESYLATE 10 MG PO TABS: 10.0000 mg | ORAL_TABLET | Freq: Every day | ORAL | 1 refills | Status: AC

## 2024-04-03 MED ORDER — WEGOVY 0.25 MG/0.5ML ~~LOC~~ SOAJ: 0.2500 mg | SUBCUTANEOUS | 0 refills | Status: AC

## 2024-04-03 NOTE — Progress Notes (Signed)
 "  Established Patient Office Visit  Subjective    Patient ID: Karen [name], female    DOB: 08-11-1970  Age: 53 y.o. MRN: [id number]  CC:  Chief Complaint  Patient presents with   Weight Management Screening    HPI Karen [name] presents for routine weight management. Patient has not had meds for about 6 months and has gained about 6-7 lbs. Patient is also for follow up of hypertension and eczema. Patient reports med compliance.   Outpatient Encounter Medications as of 04/03/2024  Medication Sig   albuterol  (VENTOLIN  HFA) 108 (90 Base) MCG/ACT inhaler TAKE 2 PUFFS BY MOUTH EVERY 6 HOURS AS NEEDED FOR WHEEZE OR SHORTNESS OF BREATH   ALPRAZolam  (XANAX ) 1 MG tablet Take 1 tablet (1 mg total) by mouth as needed for anxiety (To take prior to MRI).   amLODipine  (NORVASC ) 10 MG tablet Take 1 tablet (10 mg total) by mouth daily.   butalbital -acetaminophen -caffeine  (FIORICET) 50-325-40 MG tablet Take 50-235 tablets by mouth as needed.   Calcium  Carbonate-Vitamin D  (CALCIUM -VITAMIN D ) 600-125 MG-UNIT TABS Take 1 tablet by mouth daily.   ciprofloxacin  (CIPRO ) 500 MG tablet Take 1 tablet (500 mg total) by mouth 2 (two) times daily.   CREON 36000-114000 units CPEP capsule Take 1 capsule by mouth 3 (three) times daily with meals.   furosemide  (LASIX ) 40 MG tablet Take 1 tablet (40 mg total) by mouth once a week. Pt. Need to schedule an appt in order to receive future refills. Thank you. 2nd attempt.   gabapentin  (NEURONTIN ) 300 MG capsule START WITH 1 CAPSULE AT BEDTIME X 3 DAYS THEN 2 CAPS AT BEDTIME EVERY DAY   meclizine  (ANTIVERT ) 12.5 MG tablet Take 1 tablet (12.5 mg total) by mouth 3 (three) times daily as needed for dizziness.   meloxicam  (MOBIC ) 7.5 MG tablet Take 1 tablet (7.5 mg total) by mouth daily as needed for pain.   mirtazapine  (REMERON ) 15 MG tablet TAKE 1 TABLET BY MOUTH EVERYDAY AT BEDTIME   nitrofurantoin , macrocrystal-monohydrate, (MACROBID ) 100 MG capsule Take 1 capsule  (100 mg total) by mouth 2 (two) times daily.   ondansetron  (ZOFRAN ) 4 MG tablet Take 1 tablet (4 mg total) by mouth every 8 (eight) hours as needed for nausea or vomiting.   ondansetron  (ZOFRAN ) 4 MG tablet TAKE 1 TABLET BY MOUTH EVERY 8 HOURS AS NEEDED FOR NAUSEA AND VOMITING   potassium chloride  (KLOR-CON ) 10 MEQ tablet TAKE 1 TABLET EVERY MONDAY   scopolamine  (TRANSDERM-SCOP) 1 MG/3DAYS Place 1 patch (1.5 mg total) onto the skin every 3 (three) days.   semaglutide -weight management (WEGOVY ) 0.25 MG/0.5ML SOAJ SQ injection Inject 0.25 mg into the skin once a week.   sucralfate  (CARAFATE ) 1 g tablet Take 1 tablet (1 g total) by mouth 4 (four) times daily -  with meals and at bedtime.   tirzepatide  (ZEPBOUND ) 5 MG/0.5ML Pen Inject 5 mg into the skin once a week.   traMADol  (ULTRAM ) 50 MG tablet TAKE 1 TABLET (50 MG TOTAL) BY MOUTH EVERY 6 (SIX) HOURS AS NEEDED FOR UP TO 5 DAYS.   triamcinolone  cream (KENALOG ) 0.1 % Apply topically 2 (two) times daily.   Vitamin D , Ergocalciferol , (DRISDOL ) 1.25 MG (50000 UNIT) CAPS capsule Take 1 capsule (50,000 Units total) by mouth every 7 (seven) days.   ZEPBOUND  2.5 MG/0.5ML Pen INJECT 2.5 MG SUBCUTANEOUSLY WEEKLY   [DISCONTINUED] amLODipine  (NORVASC ) 10 MG tablet TAKE 1 TABLET BY MOUTH EVERY DAY   [DISCONTINUED] semaglutide -weight management (WEGOVY ) 0.25 MG/0.5ML SOAJ  SQ injection Inject 0.25 mg into the skin once a week.   [DISCONTINUED] triamcinolone  cream (KENALOG ) 0.1 % APPLY 1 APPLICATION TOPICALLY TWICE A DAY AS NEEDED   No facility-administered encounter medications on file as of 04/03/2024.    Past Medical History:  Diagnosis Date   Acute pulmonary embolism (HCC) 06/26/2019   Arthritis    Left knee   Carpal tunnel syndrome of right wrist 02/10/2018   Essential hypertension 10/26/2016   Family history of early CAD    Gallstone 06/25/2018   Hypertension    Incidental lung nodule 06/26/2019   Menorrhagia 02/22/2015   Morbid obesity (HCC) 11/10/2012    BMI 63    Pain in right hand 01/27/2018   PONV (postoperative nausea and vomiting)    Right upper quadrant pain 06/25/2018   Steatosis of liver 06/25/2018    Past Surgical History:  Procedure Laterality Date   CARPAL TUNNEL RELEASE Right 04/04/2018   Procedure: CARPAL TUNNEL RELEASE;  Surgeon: Heide Ingle, MD;  Location: [facility];  Service: Orthopedics;  Laterality: Right;    CHOLECYSTECTOMY     gastic sleeve  2015   HYSTEROSCOPY WITH NOVASURE N/A 03/29/2015   Procedure: HYSTEROSCOPY WITH NOVASURE;  Surgeon: Evalene SHAUNNA Organ, MD;  Location: [facility];  Service: Gynecology;  Laterality: N/A;  to follow first case around 10:15am.  Requests one hour OR time.   INTRAUTERINE DEVICE INSERTION     X 2  Spontaneously extruded X 2.   2014/2015   TUBAL LIGATION  approximately 2000   as an interval procedure historically    Family History  Problem Relation Age of Onset   Hyperlipidemia Father    Heart attack Father    Heart attack Other    Hypertension Other    Cancer Other    Stroke Other    Hypertension Mother    Parkinson's disease Mother     Social History   Socioeconomic History   Marital status: Married    Spouse name: Not on file   Number of children: Not on file   Years of education: Not on file   Highest education level: Not on file  Occupational History   Not on file  Tobacco Use   Smoking status: Former   Smokeless tobacco: Never  Vaping Use   Vaping status: Never Used  Substance and Sexual Activity   Alcohol use: Yes    Comment: occas    Drug use: No   Sexual activity: Yes    Partners: Male    Birth control/protection: Surgical    Comment: BTL  Other Topics Concern   Not on file  Social History Narrative   Right handed   Lives with friend, two story home   Social Drivers of Health   Tobacco Use: Medium Risk (04/03/2024)   Patient History    Smoking Tobacco Use: Former    Smokeless Tobacco Use: Never    Passive Exposure: Not on Programmer, Applications Strain: Low Risk (12/18/2022)   Overall Financial Resource Strain (CARDIA)    Difficulty of Paying Living Expenses: Not hard at all  Food Insecurity: No Food Insecurity (12/18/2022)   Hunger Vital Sign    Worried About Running Out of Food in the Last Year: Never true    Ran Out of Food in the Last Year: Never true  Transportation Needs: No Transportation Needs (12/18/2022)   PRAPARE - Administrator, Civil Service (Medical): No    Lack of Transportation (Non-Medical): No  Physical Activity: Inactive (12/18/2022)   Exercise Vital Sign    Days of Exercise per Week: 0 days    Minutes of Exercise per Session: 0 min  Stress: Stress Concern Present (12/18/2022)   [employer] of Occupational Health - Occupational Stress Questionnaire    Feeling of Stress : To some extent  Social Connections: Moderately Integrated (12/18/2022)   Social Connection and Isolation Panel    Frequency of Communication with Friends and Family: More than three times a week    Frequency of Social Gatherings with Friends and Family: More than three times a week    Attends Religious Services: More than 4 times per year    Active Member of [employer] or Organizations: No    Attends Banker Meetings: Never    Marital Status: Married  Catering Manager Violence: Not At Risk (12/18/2022)   Humiliation, Afraid, Rape, and Kick questionnaire    Fear of Current or Ex-Partner: No    Emotionally Abused: No    Physically Abused: No    Sexually Abused: No  Depression (PHQ2-9): Low Risk (04/03/2024)   Depression (PHQ2-9)    PHQ-2 Score: 0  Alcohol Screen: Low Risk (12/18/2022)   Alcohol Screen    Last Alcohol Screening Score (AUDIT): 0  Housing: Low Risk (12/18/2022)   Housing    Last Housing Risk Score: 0  Utilities: Not At Risk (12/18/2022)   A[facility] Utilities    Threatened with loss of utilities: No  Health Literacy: Adequate Health Literacy (12/18/2022)   B1300 Health Literacy     Frequency of need for help with medical instructions: Never    Review of Systems  All other systems reviewed and are negative.       Objective    BP 128/78   Pulse 85   Temp 98.4 F (36.9 C) (Oral)   Resp 16   Ht 5' 3 (1.6 m)   Wt 261 lb 12.8 oz (118.8 kg)   SpO2 93%   BMI 46.38 kg/m   Physical Exam Vitals and nursing note reviewed.  Constitutional:      General: She is not in acute distress.    Appearance: She is obese.  Cardiovascular:     Rate and Rhythm: Normal rate and regular rhythm.  Pulmonary:     Effort: Pulmonary effort is normal.     Breath sounds: Normal breath sounds.  Abdominal:     Palpations: Abdomen is soft.     Tenderness: There is no abdominal tenderness.  Neurological:     General: No focal deficit present.     Mental Status: She is alert and oriented to person, place, and time.         Assessment & Plan:   Encounter for weight management  Class 3 severe obesity due to excess calories without serious comorbidity with body mass index (BMI) of 45.0 to 49.9 in adult [facility])  Essential hypertension  Atopic dermatitis, unspecified type  Other orders -     Wegovy ; Inject 0.25 mg into the skin once a week.  Dispense: 2 mL; Refill: 0 -     Triamcinolone  Acetonide; Apply topically 2 (two) times daily.  Dispense: 454 g; Refill: 0 -     amLODIPine  Besylate; Take 1 tablet (10 mg total) by mouth daily.  Dispense: 90 tablet; Refill: 1     Return in about 3 months (around 07/02/2024) for follow up, weight management.   Tanda Raguel SQUIBB, MD  "

## 2024-04-06 ENCOUNTER — Other Ambulatory Visit: Payer: Self-pay | Admitting: Sports Medicine

## 2024-04-26 ENCOUNTER — Other Ambulatory Visit: Payer: Self-pay | Admitting: Family Medicine

## 2024-05-05 ENCOUNTER — Other Ambulatory Visit: Payer: Self-pay | Admitting: Orthopaedic Surgery

## 2024-07-02 ENCOUNTER — Ambulatory Visit: Payer: Self-pay | Admitting: Family Medicine
